# Patient Record
Sex: Female | Born: 1970
Health system: Southern US, Community
[De-identification: ages and names within clinical notes are randomized; demographics above are authoritative.]

## PROBLEM LIST (undated history)

## (undated) DIAGNOSIS — M359 Systemic involvement of connective tissue, unspecified: Secondary | ICD-10-CM

## (undated) DIAGNOSIS — F329 Major depressive disorder, single episode, unspecified: Secondary | ICD-10-CM

## (undated) DIAGNOSIS — J449 Chronic obstructive pulmonary disease, unspecified: Secondary | ICD-10-CM

## (undated) DIAGNOSIS — R1032 Left lower quadrant pain: Secondary | ICD-10-CM

## (undated) DIAGNOSIS — M1990 Unspecified osteoarthritis, unspecified site: Secondary | ICD-10-CM

## (undated) DIAGNOSIS — Z9889 Other specified postprocedural states: Secondary | ICD-10-CM

## (undated) DIAGNOSIS — M549 Dorsalgia, unspecified: Secondary | ICD-10-CM

## (undated) DIAGNOSIS — E039 Hypothyroidism, unspecified: Secondary | ICD-10-CM

## (undated) DIAGNOSIS — R569 Unspecified convulsions: Secondary | ICD-10-CM

## (undated) DIAGNOSIS — K5909 Other constipation: Secondary | ICD-10-CM

## (undated) DIAGNOSIS — N289 Disorder of kidney and ureter, unspecified: Secondary | ICD-10-CM

## (undated) DIAGNOSIS — G43909 Migraine, unspecified, not intractable, without status migrainosus: Secondary | ICD-10-CM

## (undated) DIAGNOSIS — I10 Essential (primary) hypertension: Secondary | ICD-10-CM

## (undated) DIAGNOSIS — F17200 Nicotine dependence, unspecified, uncomplicated: Secondary | ICD-10-CM

## (undated) DIAGNOSIS — R110 Nausea: Secondary | ICD-10-CM

## (undated) DIAGNOSIS — R011 Cardiac murmur, unspecified: Secondary | ICD-10-CM

## (undated) DIAGNOSIS — R52 Pain, unspecified: Secondary | ICD-10-CM

## (undated) DIAGNOSIS — F418 Other specified anxiety disorders: Secondary | ICD-10-CM

## (undated) DIAGNOSIS — D72829 Elevated white blood cell count, unspecified: Secondary | ICD-10-CM

## (undated) DIAGNOSIS — Z8669 Personal history of other diseases of the nervous system and sense organs: Secondary | ICD-10-CM

## (undated) DIAGNOSIS — F319 Bipolar disorder, unspecified: Secondary | ICD-10-CM

## (undated) DIAGNOSIS — M79606 Pain in leg, unspecified: Secondary | ICD-10-CM

## (undated) DIAGNOSIS — F32A Depression, unspecified: Secondary | ICD-10-CM

## (undated) DIAGNOSIS — E785 Hyperlipidemia, unspecified: Secondary | ICD-10-CM

## (undated) DIAGNOSIS — R112 Nausea with vomiting, unspecified: Secondary | ICD-10-CM

## (undated) DIAGNOSIS — G8929 Other chronic pain: Secondary | ICD-10-CM

## (undated) DIAGNOSIS — M5416 Radiculopathy, lumbar region: Secondary | ICD-10-CM

## (undated) DIAGNOSIS — R5383 Other fatigue: Secondary | ICD-10-CM

## (undated) DIAGNOSIS — E669 Obesity, unspecified: Secondary | ICD-10-CM

## (undated) DIAGNOSIS — Z862 Personal history of diseases of the blood and blood-forming organs and certain disorders involving the immune mechanism: Secondary | ICD-10-CM

## (undated) DIAGNOSIS — M25569 Pain in unspecified knee: Secondary | ICD-10-CM

## (undated) HISTORY — DX: Pain in unspecified knee: M25.569

## (undated) HISTORY — DX: Unspecified osteoarthritis, unspecified site: M19.90

## (undated) HISTORY — DX: Other chronic pain: G89.29

## (undated) HISTORY — DX: Migraine, unspecified, not intractable, without status migrainosus: G43.909

## (undated) HISTORY — DX: Obesity, unspecified: E66.9

## (undated) HISTORY — DX: Depression, unspecified: F32.A

## (undated) HISTORY — DX: Unspecified convulsions: R56.9

## (undated) HISTORY — DX: Left lower quadrant pain: R10.32

## (undated) HISTORY — DX: Nausea: R11.0

## (undated) HISTORY — DX: Personal history of diseases of the blood and blood-forming organs and certain disorders involving the immune mechanism: Z86.2

## (undated) HISTORY — DX: Other specified anxiety disorders: F41.8

## (undated) HISTORY — DX: Dorsalgia, unspecified: M54.9

## (undated) HISTORY — DX: Other fatigue: R53.83

## (undated) HISTORY — DX: Chronic obstructive pulmonary disease, unspecified: J44.9

## (undated) HISTORY — DX: Hyperlipidemia, unspecified: E78.5

## (undated) HISTORY — DX: Personal history of other diseases of the nervous system and sense organs: Z86.69

## (undated) HISTORY — DX: Other constipation: K59.09

## (undated) HISTORY — DX: Essential (primary) hypertension: I10

## (undated) HISTORY — DX: Bipolar disorder, unspecified: F31.9

## (undated) HISTORY — DX: Elevated white blood cell count, unspecified: D72.829

## (undated) HISTORY — DX: Major depressive disorder, single episode, unspecified: F32.9

## (undated) HISTORY — DX: Nicotine dependence, unspecified, uncomplicated: F17.200

## (undated) HISTORY — PX: BACK SURGERY: SHX140

---

## 2003-09-07 ENCOUNTER — Emergency Department (HOSPITAL_COMMUNITY): Admission: EM | Admit: 2003-09-07 | Discharge: 2003-09-08 | Payer: Self-pay | Admitting: Emergency Medicine

## 2003-09-15 ENCOUNTER — Ambulatory Visit (HOSPITAL_COMMUNITY): Admission: RE | Admit: 2003-09-15 | Discharge: 2003-09-15 | Payer: Self-pay | Admitting: Orthopaedic Surgery

## 2003-09-22 ENCOUNTER — Encounter (HOSPITAL_COMMUNITY): Admission: RE | Admit: 2003-09-22 | Discharge: 2003-10-22 | Payer: Self-pay | Admitting: Orthopaedic Surgery

## 2004-11-17 ENCOUNTER — Emergency Department (HOSPITAL_COMMUNITY): Admission: EM | Admit: 2004-11-17 | Discharge: 2004-11-17 | Payer: Self-pay | Admitting: Emergency Medicine

## 2005-01-14 ENCOUNTER — Emergency Department (HOSPITAL_COMMUNITY): Admission: EM | Admit: 2005-01-14 | Discharge: 2005-01-14 | Payer: Self-pay | Admitting: Emergency Medicine

## 2005-01-26 ENCOUNTER — Encounter: Payer: Self-pay | Admitting: Family Medicine

## 2005-04-23 DIAGNOSIS — F418 Other specified anxiety disorders: Secondary | ICD-10-CM

## 2005-04-23 HISTORY — DX: Other specified anxiety disorders: F41.8

## 2005-12-30 ENCOUNTER — Emergency Department (HOSPITAL_COMMUNITY): Admission: EM | Admit: 2005-12-30 | Discharge: 2005-12-30 | Payer: Self-pay | Admitting: Emergency Medicine

## 2006-01-21 ENCOUNTER — Ambulatory Visit: Payer: Self-pay | Admitting: Psychiatry

## 2006-01-21 ENCOUNTER — Inpatient Hospital Stay (HOSPITAL_COMMUNITY): Admission: RE | Admit: 2006-01-21 | Discharge: 2006-01-25 | Payer: Self-pay | Admitting: Psychiatry

## 2006-03-30 ENCOUNTER — Emergency Department (HOSPITAL_COMMUNITY): Admission: EM | Admit: 2006-03-30 | Discharge: 2006-03-30 | Payer: Self-pay | Admitting: Emergency Medicine

## 2006-04-01 ENCOUNTER — Emergency Department (HOSPITAL_COMMUNITY): Admission: EM | Admit: 2006-04-01 | Discharge: 2006-04-01 | Payer: Self-pay | Admitting: Emergency Medicine

## 2006-06-20 ENCOUNTER — Ambulatory Visit (HOSPITAL_COMMUNITY): Admission: RE | Admit: 2006-06-20 | Discharge: 2006-06-20 | Payer: Self-pay | Admitting: Family Medicine

## 2006-12-26 ENCOUNTER — Ambulatory Visit (HOSPITAL_COMMUNITY): Admission: RE | Admit: 2006-12-26 | Discharge: 2006-12-26 | Payer: Self-pay | Admitting: Family Medicine

## 2007-04-18 ENCOUNTER — Emergency Department (HOSPITAL_COMMUNITY): Admission: EM | Admit: 2007-04-18 | Discharge: 2007-04-18 | Payer: Self-pay | Admitting: Emergency Medicine

## 2007-04-24 ENCOUNTER — Encounter: Payer: Self-pay | Admitting: Family Medicine

## 2007-04-24 HISTORY — PX: ABDOMINAL HYSTERECTOMY: SHX81

## 2007-07-07 ENCOUNTER — Emergency Department (HOSPITAL_COMMUNITY): Admission: EM | Admit: 2007-07-07 | Discharge: 2007-07-07 | Payer: Self-pay | Admitting: Emergency Medicine

## 2008-04-28 ENCOUNTER — Emergency Department (HOSPITAL_COMMUNITY): Admission: EM | Admit: 2008-04-28 | Discharge: 2008-04-29 | Payer: Self-pay | Admitting: Emergency Medicine

## 2008-06-03 ENCOUNTER — Emergency Department (HOSPITAL_COMMUNITY): Admission: EM | Admit: 2008-06-03 | Discharge: 2008-06-03 | Payer: Self-pay | Admitting: Emergency Medicine

## 2008-06-24 ENCOUNTER — Emergency Department (HOSPITAL_COMMUNITY): Admission: EM | Admit: 2008-06-24 | Discharge: 2008-06-24 | Payer: Self-pay | Admitting: Emergency Medicine

## 2008-08-27 ENCOUNTER — Emergency Department (HOSPITAL_COMMUNITY): Admission: EM | Admit: 2008-08-27 | Discharge: 2008-08-27 | Payer: Self-pay | Admitting: Emergency Medicine

## 2008-09-11 ENCOUNTER — Emergency Department (HOSPITAL_COMMUNITY): Admission: EM | Admit: 2008-09-11 | Discharge: 2008-09-11 | Payer: Self-pay | Admitting: Emergency Medicine

## 2008-09-13 ENCOUNTER — Ambulatory Visit: Payer: Self-pay | Admitting: Family Medicine

## 2008-09-13 DIAGNOSIS — M549 Dorsalgia, unspecified: Secondary | ICD-10-CM | POA: Insufficient documentation

## 2008-09-13 DIAGNOSIS — M940 Chondrocostal junction syndrome [Tietze]: Secondary | ICD-10-CM | POA: Insufficient documentation

## 2008-09-13 DIAGNOSIS — I10 Essential (primary) hypertension: Secondary | ICD-10-CM | POA: Insufficient documentation

## 2008-09-13 DIAGNOSIS — L049 Acute lymphadenitis, unspecified: Secondary | ICD-10-CM | POA: Insufficient documentation

## 2008-09-16 ENCOUNTER — Encounter: Payer: Self-pay | Admitting: Family Medicine

## 2008-09-21 ENCOUNTER — Encounter: Payer: Self-pay | Admitting: Family Medicine

## 2008-09-21 ENCOUNTER — Telehealth: Payer: Self-pay | Admitting: Family Medicine

## 2008-09-22 ENCOUNTER — Telehealth: Payer: Self-pay | Admitting: Family Medicine

## 2008-09-27 ENCOUNTER — Telehealth: Payer: Self-pay | Admitting: Family Medicine

## 2008-09-27 LAB — CONVERTED CEMR LAB
BUN: 6 mg/dL (ref 6–23)
Basophils Absolute: 0 10*3/uL (ref 0.0–0.1)
Basophils Relative: 0 % (ref 0–1)
CO2: 25 meq/L (ref 19–32)
Calcium: 9.3 mg/dL (ref 8.4–10.5)
Chloride: 102 meq/L (ref 96–112)
Cholesterol: 220 mg/dL — ABNORMAL HIGH (ref 0–200)
Creatinine, Ser: 0.69 mg/dL (ref 0.40–1.20)
Eosinophils Absolute: 0.4 10*3/uL (ref 0.0–0.7)
Eosinophils Relative: 4 % (ref 0–5)
Glucose, Bld: 83 mg/dL (ref 70–99)
HCT: 41 % (ref 36.0–46.0)
HDL: 31 mg/dL — ABNORMAL LOW (ref >39)
Hemoglobin: 13.9 g/dL (ref 12.0–15.0)
LDL Cholesterol: 137 mg/dL — ABNORMAL HIGH (ref 0–99)
Lymphocytes Relative: 34 % (ref 12–46)
Lymphs Abs: 3.7 10*3/uL (ref 0.7–4.0)
MCHC: 33.9 g/dL (ref 30.0–36.0)
MCV: 94.5 fL (ref 78.0–100.0)
Monocytes Absolute: 0.7 10*3/uL (ref 0.1–1.0)
Monocytes Relative: 6 % (ref 3–12)
Neutro Abs: 6 10*3/uL (ref 1.7–7.7)
Neutrophils Relative %: 55 % (ref 43–77)
Platelets: 396 10*3/uL (ref 150–400)
Potassium: 4.3 meq/L (ref 3.5–5.3)
RBC: 4.34 M/uL (ref 3.87–5.11)
RDW: 12.6 % (ref 11.5–15.5)
Sodium: 139 meq/L (ref 135–145)
Total CHOL/HDL Ratio: 7.1
Triglycerides: 261 mg/dL — ABNORMAL HIGH (ref <150)
VLDL: 52 mg/dL — ABNORMAL HIGH (ref 0–40)
WBC: 10.8 10*3/uL — ABNORMAL HIGH (ref 4.0–10.5)

## 2008-10-14 ENCOUNTER — Telehealth: Payer: Self-pay | Admitting: Family Medicine

## 2008-10-14 ENCOUNTER — Emergency Department (HOSPITAL_COMMUNITY): Admission: EM | Admit: 2008-10-14 | Discharge: 2008-10-14 | Payer: Self-pay | Admitting: Emergency Medicine

## 2008-10-15 ENCOUNTER — Ambulatory Visit: Payer: Self-pay | Admitting: Family Medicine

## 2008-10-18 ENCOUNTER — Encounter: Payer: Self-pay | Admitting: Family Medicine

## 2008-10-19 ENCOUNTER — Telehealth: Payer: Self-pay | Admitting: Family Medicine

## 2008-10-19 DIAGNOSIS — E876 Hypokalemia: Secondary | ICD-10-CM | POA: Insufficient documentation

## 2008-10-20 ENCOUNTER — Encounter: Payer: Self-pay | Admitting: Family Medicine

## 2008-10-21 LAB — CONVERTED CEMR LAB: Potassium: 3.6 meq/L (ref 3.5–5.3)

## 2008-10-22 ENCOUNTER — Telehealth: Payer: Self-pay | Admitting: Family Medicine

## 2008-11-03 ENCOUNTER — Emergency Department (HOSPITAL_COMMUNITY): Admission: EM | Admit: 2008-11-03 | Discharge: 2008-11-03 | Payer: Self-pay | Admitting: Emergency Medicine

## 2008-11-04 ENCOUNTER — Ambulatory Visit: Payer: Self-pay | Admitting: Family Medicine

## 2008-11-04 DIAGNOSIS — E785 Hyperlipidemia, unspecified: Secondary | ICD-10-CM | POA: Insufficient documentation

## 2008-11-08 ENCOUNTER — Telehealth: Payer: Self-pay | Admitting: Family Medicine

## 2008-11-08 ENCOUNTER — Encounter: Payer: Self-pay | Admitting: Family Medicine

## 2008-12-16 ENCOUNTER — Ambulatory Visit: Payer: Self-pay | Admitting: Family Medicine

## 2008-12-16 DIAGNOSIS — R5383 Other fatigue: Secondary | ICD-10-CM | POA: Insufficient documentation

## 2008-12-17 LAB — CONVERTED CEMR LAB
ALT: 16 units/L (ref 0–35)
AST: 26 units/L (ref 0–37)
Albumin: 4.3 g/dL (ref 3.5–5.2)
Alkaline Phosphatase: 107 units/L (ref 39–117)
BUN: 9 mg/dL (ref 6–23)
Bilirubin, Direct: <0.1 mg/dL (ref 0.0–0.3)
CO2: 30 meq/L (ref 19–32)
Calcium: 10.3 mg/dL (ref 8.4–10.5)
Chloride: 92 meq/L — ABNORMAL LOW (ref 96–112)
Cholesterol: 191 mg/dL (ref 0–200)
Creatinine, Ser: 0.73 mg/dL (ref 0.40–1.20)
Glucose, Bld: 91 mg/dL (ref 70–99)
HDL: 39 mg/dL — ABNORMAL LOW (ref >39)
LDL Cholesterol: 110 mg/dL — ABNORMAL HIGH (ref 0–99)
Potassium: 3.3 meq/L — ABNORMAL LOW (ref 3.5–5.3)
Sodium: 136 meq/L (ref 135–145)
TSH: 2.417 microintl units/mL (ref 0.350–4.500)
Total Bilirubin: 0.2 mg/dL — ABNORMAL LOW (ref 0.3–1.2)
Total CHOL/HDL Ratio: 4.9
Total Protein: 6.9 g/dL (ref 6.0–8.3)
Triglycerides: 209 mg/dL — ABNORMAL HIGH (ref <150)
VLDL: 42 mg/dL — ABNORMAL HIGH (ref 0–40)

## 2008-12-19 DIAGNOSIS — F17200 Nicotine dependence, unspecified, uncomplicated: Secondary | ICD-10-CM | POA: Insufficient documentation

## 2008-12-22 ENCOUNTER — Telehealth: Payer: Self-pay | Admitting: Family Medicine

## 2008-12-24 ENCOUNTER — Encounter: Payer: Self-pay | Admitting: Family Medicine

## 2008-12-30 ENCOUNTER — Encounter: Payer: Self-pay | Admitting: Family Medicine

## 2008-12-31 ENCOUNTER — Encounter: Payer: Self-pay | Admitting: Family Medicine

## 2008-12-31 ENCOUNTER — Inpatient Hospital Stay (HOSPITAL_COMMUNITY): Admission: EM | Admit: 2008-12-31 | Discharge: 2009-01-02 | Payer: Self-pay | Admitting: Emergency Medicine

## 2009-01-03 ENCOUNTER — Telehealth: Payer: Self-pay | Admitting: Family Medicine

## 2009-01-05 ENCOUNTER — Telehealth: Payer: Self-pay | Admitting: Family Medicine

## 2009-01-05 LAB — CONVERTED CEMR LAB
BUN: 5 mg/dL — ABNORMAL LOW (ref 6–23)
CO2: 27 meq/L (ref 19–32)
Calcium: 10.4 mg/dL (ref 8.4–10.5)
Chloride: 96 meq/L (ref 96–112)
Creatinine, Ser: 0.94 mg/dL (ref 0.40–1.20)
Glucose, Bld: 115 mg/dL — ABNORMAL HIGH (ref 70–99)
Potassium: 3.5 meq/L (ref 3.5–5.3)
Sodium: 134 meq/L — ABNORMAL LOW (ref 135–145)

## 2009-01-11 ENCOUNTER — Telehealth: Payer: Self-pay | Admitting: Family Medicine

## 2009-01-12 ENCOUNTER — Ambulatory Visit: Payer: Self-pay | Admitting: Family Medicine

## 2009-01-12 DIAGNOSIS — R519 Headache, unspecified: Secondary | ICD-10-CM | POA: Insufficient documentation

## 2009-01-12 DIAGNOSIS — R110 Nausea: Secondary | ICD-10-CM | POA: Insufficient documentation

## 2009-01-12 DIAGNOSIS — R51 Headache: Secondary | ICD-10-CM | POA: Insufficient documentation

## 2009-01-13 ENCOUNTER — Telehealth: Payer: Self-pay | Admitting: Family Medicine

## 2009-01-19 ENCOUNTER — Telehealth: Payer: Self-pay | Admitting: Family Medicine

## 2009-01-20 ENCOUNTER — Telehealth: Payer: Self-pay | Admitting: Family Medicine

## 2009-01-20 ENCOUNTER — Encounter (INDEPENDENT_AMBULATORY_CARE_PROVIDER_SITE_OTHER): Payer: Self-pay | Admitting: *Deleted

## 2009-01-24 ENCOUNTER — Encounter: Payer: Self-pay | Admitting: Family Medicine

## 2009-01-27 ENCOUNTER — Telehealth: Payer: Self-pay | Admitting: Family Medicine

## 2009-01-27 ENCOUNTER — Ambulatory Visit: Payer: Self-pay | Admitting: Family Medicine

## 2009-01-27 LAB — CONVERTED CEMR LAB
Bilirubin Urine: NEGATIVE
Glucose, Urine, Semiquant: NEGATIVE
Ketones, urine, test strip: NEGATIVE
Nitrite: NEGATIVE
Protein, U semiquant: NEGATIVE
Specific Gravity, Urine: 1.015
Urobilinogen, UA: 0.2
WBC Urine, dipstick: NEGATIVE
pH: 7

## 2009-01-28 ENCOUNTER — Telehealth: Payer: Self-pay | Admitting: Family Medicine

## 2009-01-31 ENCOUNTER — Telehealth: Payer: Self-pay | Admitting: Family Medicine

## 2009-02-02 ENCOUNTER — Encounter: Payer: Self-pay | Admitting: Family Medicine

## 2009-02-03 ENCOUNTER — Other Ambulatory Visit: Admission: RE | Admit: 2009-02-03 | Discharge: 2009-02-03 | Payer: Self-pay | Admitting: Family Medicine

## 2009-02-03 ENCOUNTER — Ambulatory Visit: Payer: Self-pay | Admitting: Family Medicine

## 2009-02-03 ENCOUNTER — Encounter: Payer: Self-pay | Admitting: Family Medicine

## 2009-02-04 ENCOUNTER — Encounter: Payer: Self-pay | Admitting: Family Medicine

## 2009-02-04 LAB — CONVERTED CEMR LAB
BUN: 8 mg/dL (ref 6–23)
Bacteria, UA: NONE SEEN
Bilirubin Urine: NEGATIVE
CO2: 32 meq/L (ref 19–32)
Calcium: 10.4 mg/dL (ref 8.4–10.5)
Chloride: 96 meq/L (ref 96–112)
Creatinine, Ser: 0.86 mg/dL (ref 0.40–1.20)
Glucose, Bld: 99 mg/dL (ref 70–99)
Hemoglobin, Urine: NEGATIVE
Ketones, ur: NEGATIVE mg/dL
Nitrite: POSITIVE — AB
Potassium: 3.4 meq/L — ABNORMAL LOW (ref 3.5–5.3)
Protein, ur: NEGATIVE mg/dL
RBC / HPF: NONE SEEN (ref <3)
Sodium: 138 meq/L (ref 135–145)
Specific Gravity, Urine: 1.014 (ref 1.005–1.030)
Urine Glucose: NEGATIVE mg/dL
Urobilinogen, UA: 1 (ref 0.0–1.0)
WBC, UA: NONE SEEN cells/hpf (ref <3)
pH: 7 (ref 5.0–8.0)

## 2009-02-09 ENCOUNTER — Telehealth: Payer: Self-pay | Admitting: Family Medicine

## 2009-02-11 ENCOUNTER — Ambulatory Visit: Payer: Self-pay | Admitting: Gastroenterology

## 2009-02-11 DIAGNOSIS — J4489 Other specified chronic obstructive pulmonary disease: Secondary | ICD-10-CM | POA: Insufficient documentation

## 2009-02-11 DIAGNOSIS — K5909 Other constipation: Secondary | ICD-10-CM | POA: Insufficient documentation

## 2009-02-11 DIAGNOSIS — D72829 Elevated white blood cell count, unspecified: Secondary | ICD-10-CM | POA: Insufficient documentation

## 2009-02-11 DIAGNOSIS — K219 Gastro-esophageal reflux disease without esophagitis: Secondary | ICD-10-CM | POA: Insufficient documentation

## 2009-02-11 DIAGNOSIS — M1990 Unspecified osteoarthritis, unspecified site: Secondary | ICD-10-CM | POA: Insufficient documentation

## 2009-02-11 DIAGNOSIS — R569 Unspecified convulsions: Secondary | ICD-10-CM | POA: Insufficient documentation

## 2009-02-11 DIAGNOSIS — J449 Chronic obstructive pulmonary disease, unspecified: Secondary | ICD-10-CM | POA: Insufficient documentation

## 2009-02-11 DIAGNOSIS — R1032 Left lower quadrant pain: Secondary | ICD-10-CM | POA: Insufficient documentation

## 2009-02-11 DIAGNOSIS — F319 Bipolar disorder, unspecified: Secondary | ICD-10-CM | POA: Insufficient documentation

## 2009-02-21 ENCOUNTER — Encounter (INDEPENDENT_AMBULATORY_CARE_PROVIDER_SITE_OTHER): Payer: Self-pay | Admitting: *Deleted

## 2009-02-24 ENCOUNTER — Ambulatory Visit (HOSPITAL_COMMUNITY): Admission: RE | Admit: 2009-02-24 | Discharge: 2009-02-24 | Payer: Self-pay | Admitting: Urology

## 2009-02-28 ENCOUNTER — Encounter: Payer: Self-pay | Admitting: Family Medicine

## 2009-02-28 ENCOUNTER — Emergency Department (HOSPITAL_COMMUNITY): Admission: EM | Admit: 2009-02-28 | Discharge: 2009-03-01 | Payer: Self-pay | Admitting: Emergency Medicine

## 2009-03-01 ENCOUNTER — Encounter: Payer: Self-pay | Admitting: Family Medicine

## 2009-03-03 ENCOUNTER — Ambulatory Visit: Payer: Self-pay | Admitting: Family Medicine

## 2009-03-03 DIAGNOSIS — R112 Nausea with vomiting, unspecified: Secondary | ICD-10-CM | POA: Insufficient documentation

## 2009-03-04 LAB — CONVERTED CEMR LAB
BUN: 5 mg/dL — ABNORMAL LOW (ref 6–23)
Basophils Absolute: 0.1 10*3/uL (ref 0.0–0.1)
Basophils Relative: 1 % (ref 0–1)
CO2: 29 meq/L (ref 19–32)
Calcium: 10.4 mg/dL (ref 8.4–10.5)
Chloride: 93 meq/L — ABNORMAL LOW (ref 96–112)
Creatinine, Ser: 0.74 mg/dL (ref 0.40–1.20)
Eosinophils Absolute: 0.1 10*3/uL (ref 0.0–0.7)
Eosinophils Relative: 1 % (ref 0–5)
Glucose, Bld: 93 mg/dL (ref 70–99)
HCT: 42.5 % (ref 36.0–46.0)
Hemoglobin: 14.6 g/dL (ref 12.0–15.0)
Lymphocytes Relative: 41 % (ref 12–46)
Lymphs Abs: 5.5 10*3/uL — ABNORMAL HIGH (ref 0.7–4.0)
MCHC: 34.3 g/dL (ref 30.0–36.0)
MCV: 95.1 fL (ref 78.0–100.0)
Monocytes Absolute: 0.7 10*3/uL (ref 0.1–1.0)
Monocytes Relative: 5 % (ref 3–12)
Neutro Abs: 7.2 10*3/uL (ref 1.7–7.7)
Neutrophils Relative %: 53 % (ref 43–77)
Platelets: 391 10*3/uL (ref 150–400)
Potassium: 3.8 meq/L (ref 3.5–5.3)
RBC: 4.47 M/uL (ref 3.87–5.11)
RDW: 13 % (ref 11.5–15.5)
Sodium: 133 meq/L — ABNORMAL LOW (ref 135–145)
WBC: 13.6 10*3/uL — ABNORMAL HIGH (ref 4.0–10.5)

## 2009-03-07 ENCOUNTER — Telehealth: Payer: Self-pay | Admitting: Family Medicine

## 2009-03-10 ENCOUNTER — Encounter (HOSPITAL_COMMUNITY): Admission: RE | Admit: 2009-03-10 | Discharge: 2009-04-09 | Payer: Self-pay | Admitting: Family Medicine

## 2009-03-14 ENCOUNTER — Telehealth: Payer: Self-pay | Admitting: Family Medicine

## 2009-03-16 ENCOUNTER — Emergency Department (HOSPITAL_COMMUNITY): Admission: EM | Admit: 2009-03-16 | Discharge: 2009-03-16 | Payer: Self-pay | Admitting: Emergency Medicine

## 2009-03-16 ENCOUNTER — Ambulatory Visit: Payer: Self-pay | Admitting: Family Medicine

## 2009-03-16 ENCOUNTER — Telehealth: Payer: Self-pay | Admitting: Family Medicine

## 2009-03-16 DIAGNOSIS — R109 Unspecified abdominal pain: Secondary | ICD-10-CM | POA: Insufficient documentation

## 2009-03-21 ENCOUNTER — Ambulatory Visit: Payer: Self-pay | Admitting: Family Medicine

## 2009-03-22 ENCOUNTER — Telehealth: Payer: Self-pay | Admitting: Family Medicine

## 2009-03-23 ENCOUNTER — Telehealth: Payer: Self-pay | Admitting: Family Medicine

## 2009-03-28 ENCOUNTER — Telehealth: Payer: Self-pay | Admitting: Family Medicine

## 2009-03-29 LAB — CONVERTED CEMR LAB
BUN: 8 mg/dL (ref 6–23)
Basophils Absolute: 0.1 10*3/uL (ref 0.0–0.1)
Basophils Relative: 1 % (ref 0–1)
CO2: 29 meq/L (ref 19–32)
Calcium: 9.7 mg/dL (ref 8.4–10.5)
Chloride: 97 meq/L (ref 96–112)
Creatinine, Ser: 0.72 mg/dL (ref 0.40–1.20)
Eosinophils Absolute: 0.5 10*3/uL (ref 0.0–0.7)
Eosinophils Relative: 4 % (ref 0–5)
Glucose, Bld: 89 mg/dL (ref 70–99)
HCT: 42.4 % (ref 36.0–46.0)
Hemoglobin: 14.2 g/dL (ref 12.0–15.0)
Lymphocytes Relative: 43 % (ref 12–46)
Lymphs Abs: 4.6 10*3/uL — ABNORMAL HIGH (ref 0.7–4.0)
MCHC: 33.5 g/dL (ref 30.0–36.0)
MCV: 96.2 fL (ref 78.0–100.0)
Monocytes Absolute: 0.7 10*3/uL (ref 0.1–1.0)
Monocytes Relative: 6 % (ref 3–12)
Neutro Abs: 4.9 10*3/uL (ref 1.7–7.7)
Neutrophils Relative %: 45 % (ref 43–77)
Platelets: 403 10*3/uL — ABNORMAL HIGH (ref 150–400)
Potassium: 3.9 meq/L (ref 3.5–5.3)
RBC: 4.41 M/uL (ref 3.87–5.11)
RDW: 13.1 % (ref 11.5–15.5)
Sodium: 134 meq/L — ABNORMAL LOW (ref 135–145)
WBC: 10.7 10*3/uL — ABNORMAL HIGH (ref 4.0–10.5)

## 2009-04-03 ENCOUNTER — Emergency Department (HOSPITAL_COMMUNITY): Admission: EM | Admit: 2009-04-03 | Discharge: 2009-04-03 | Payer: Self-pay | Admitting: Emergency Medicine

## 2009-04-04 ENCOUNTER — Telehealth: Payer: Self-pay | Admitting: Family Medicine

## 2009-04-08 ENCOUNTER — Emergency Department (HOSPITAL_COMMUNITY): Admission: EM | Admit: 2009-04-08 | Discharge: 2009-04-08 | Payer: Self-pay | Admitting: Emergency Medicine

## 2009-04-11 ENCOUNTER — Encounter: Payer: Self-pay | Admitting: Family Medicine

## 2009-04-12 LAB — CONVERTED CEMR LAB: Potassium: 3.4 meq/L — ABNORMAL LOW (ref 3.5–5.3)

## 2009-04-20 ENCOUNTER — Ambulatory Visit: Payer: Self-pay | Admitting: Family Medicine

## 2009-04-20 DIAGNOSIS — M25559 Pain in unspecified hip: Secondary | ICD-10-CM | POA: Insufficient documentation

## 2009-04-25 ENCOUNTER — Telehealth: Payer: Self-pay | Admitting: Family Medicine

## 2009-04-26 ENCOUNTER — Telehealth: Payer: Self-pay | Admitting: Family Medicine

## 2009-05-04 ENCOUNTER — Telehealth: Payer: Self-pay | Admitting: Family Medicine

## 2009-05-17 ENCOUNTER — Telehealth: Payer: Self-pay | Admitting: Family Medicine

## 2009-05-24 ENCOUNTER — Ambulatory Visit: Payer: Self-pay | Admitting: Family Medicine

## 2009-05-24 DIAGNOSIS — D239 Other benign neoplasm of skin, unspecified: Secondary | ICD-10-CM | POA: Insufficient documentation

## 2009-05-25 LAB — CONVERTED CEMR LAB: Potassium: 3.7 meq/L (ref 3.5–5.3)

## 2009-07-11 ENCOUNTER — Emergency Department (HOSPITAL_COMMUNITY): Admission: EM | Admit: 2009-07-11 | Discharge: 2009-07-11 | Payer: Self-pay | Admitting: Emergency Medicine

## 2009-07-15 ENCOUNTER — Ambulatory Visit: Payer: Self-pay | Admitting: Family Medicine

## 2009-07-15 LAB — CONVERTED CEMR LAB
ALT: 9 units/L (ref 0–35)
AST: 17 units/L (ref 0–37)
Albumin: 3.7 g/dL (ref 3.5–5.2)
Alkaline Phosphatase: 97 units/L (ref 39–117)
BUN: 5 mg/dL — ABNORMAL LOW (ref 6–23)
CO2: 30 meq/L (ref 19–32)
Calcium: 9 mg/dL (ref 8.4–10.5)
Chloride: 98 meq/L (ref 96–112)
Creatinine, Ser: 0.68 mg/dL (ref 0.40–1.20)
Glucose, Bld: 81 mg/dL (ref 70–99)
Hgb A1c MFr Bld: 6.1 % (ref 4.6–6.1)
Potassium: 3.8 meq/L (ref 3.5–5.3)
Sodium: 138 meq/L (ref 135–145)
Total Bilirubin: 0.2 mg/dL — ABNORMAL LOW (ref 0.3–1.2)
Total Protein: 6 g/dL (ref 6.0–8.3)

## 2009-07-18 ENCOUNTER — Encounter (INDEPENDENT_AMBULATORY_CARE_PROVIDER_SITE_OTHER): Payer: Self-pay | Admitting: *Deleted

## 2009-08-01 ENCOUNTER — Encounter (INDEPENDENT_AMBULATORY_CARE_PROVIDER_SITE_OTHER): Payer: Self-pay | Admitting: *Deleted

## 2009-08-16 ENCOUNTER — Emergency Department (HOSPITAL_COMMUNITY)
Admission: EM | Admit: 2009-08-16 | Discharge: 2009-08-17 | Disposition: A | Discharge status: Other Acute Inpatient Hospital | Payer: Self-pay | Source: Home / Self Care | Admitting: Emergency Medicine

## 2009-08-29 ENCOUNTER — Ambulatory Visit: Payer: Self-pay | Admitting: Family Medicine

## 2009-09-01 ENCOUNTER — Telehealth: Payer: Self-pay | Admitting: Physician Assistant

## 2009-09-12 ENCOUNTER — Ambulatory Visit: Payer: Self-pay | Admitting: Family Medicine

## 2010-01-17 ENCOUNTER — Telehealth: Payer: Self-pay | Admitting: Family Medicine

## 2010-01-19 ENCOUNTER — Ambulatory Visit: Payer: Self-pay | Admitting: Family Medicine

## 2010-01-19 DIAGNOSIS — R599 Enlarged lymph nodes, unspecified: Secondary | ICD-10-CM | POA: Insufficient documentation

## 2010-01-20 LAB — CONVERTED CEMR LAB
BUN: 8 mg/dL (ref 6–23)
CO2: 31 meq/L (ref 19–32)
Calcium: 10.8 mg/dL — ABNORMAL HIGH (ref 8.4–10.5)
Chloride: 88 meq/L — ABNORMAL LOW (ref 96–112)
Creatinine, Ser: 0.79 mg/dL (ref 0.40–1.20)
Glucose, Bld: 86 mg/dL (ref 70–99)
Potassium: 3.5 meq/L (ref 3.5–5.3)
Sodium: 134 meq/L — ABNORMAL LOW (ref 135–145)

## 2010-01-24 ENCOUNTER — Telehealth: Payer: Self-pay | Admitting: Physician Assistant

## 2010-02-22 ENCOUNTER — Telehealth: Payer: Self-pay | Admitting: Family Medicine

## 2010-03-15 ENCOUNTER — Ambulatory Visit: Payer: Self-pay | Admitting: Family Medicine

## 2010-03-21 DIAGNOSIS — L919 Hypertrophic disorder of the skin, unspecified: Secondary | ICD-10-CM

## 2010-03-21 DIAGNOSIS — L909 Atrophic disorder of skin, unspecified: Secondary | ICD-10-CM | POA: Insufficient documentation

## 2010-03-24 ENCOUNTER — Telehealth (INDEPENDENT_AMBULATORY_CARE_PROVIDER_SITE_OTHER): Payer: Self-pay | Admitting: *Deleted

## 2010-03-29 ENCOUNTER — Telehealth: Payer: Self-pay | Admitting: Family Medicine

## 2010-03-29 ENCOUNTER — Telehealth (INDEPENDENT_AMBULATORY_CARE_PROVIDER_SITE_OTHER): Payer: Self-pay | Admitting: *Deleted

## 2010-03-30 ENCOUNTER — Encounter: Payer: Self-pay | Admitting: Family Medicine

## 2010-03-31 ENCOUNTER — Encounter: Payer: Self-pay | Admitting: Family Medicine

## 2010-03-31 LAB — CONVERTED CEMR LAB
ALT: 9 units/L (ref 0–35)
AST: 13 units/L (ref 0–37)
Albumin: 4.5 g/dL (ref 3.5–5.2)
Alkaline Phosphatase: 121 units/L — ABNORMAL HIGH (ref 39–117)
BUN: 5 mg/dL — ABNORMAL LOW (ref 6–23)
Basophils Absolute: 0 10*3/uL (ref 0.0–0.1)
Basophils Relative: 0 % (ref 0–1)
Bilirubin, Direct: 0.1 mg/dL (ref 0.0–0.3)
CO2: 27 meq/L (ref 19–32)
Calcium: 9.2 mg/dL (ref 8.4–10.5)
Chloride: 83 meq/L — ABNORMAL LOW (ref 96–112)
Cholesterol: 186 mg/dL (ref 0–200)
Creatinine, Ser: 0.58 mg/dL (ref 0.40–1.20)
Eosinophils Absolute: 0.2 10*3/uL (ref 0.0–0.7)
Eosinophils Relative: 2 % (ref 0–5)
Glucose, Bld: 90 mg/dL (ref 70–99)
HCT: 38.1 % (ref 36.0–46.0)
HDL: 42 mg/dL (ref >39)
Hemoglobin: 13.6 g/dL (ref 12.0–15.0)
Hgb A1c MFr Bld: 6 % — ABNORMAL HIGH (ref <5.7)
Indirect Bilirubin: 0.1 mg/dL (ref 0.0–0.9)
LDL Cholesterol: 116 mg/dL — ABNORMAL HIGH (ref 0–99)
Lymphocytes Relative: 38 % (ref 12–46)
Lymphs Abs: 3.9 10*3/uL (ref 0.7–4.0)
MCHC: 35.7 g/dL (ref 30.0–36.0)
MCV: 95 fL (ref 78.0–100.0)
Monocytes Absolute: 0.5 10*3/uL (ref 0.1–1.0)
Monocytes Relative: 5 % (ref 3–12)
Neutro Abs: 5.6 10*3/uL (ref 1.7–7.7)
Neutrophils Relative %: 54 % (ref 43–77)
Platelets: 408 10*3/uL — ABNORMAL HIGH (ref 150–400)
Potassium: 3.4 meq/L — ABNORMAL LOW (ref 3.5–5.3)
RBC: 4.01 M/uL (ref 3.87–5.11)
RDW: 13.2 % (ref 11.5–15.5)
Sodium: 126 meq/L — ABNORMAL LOW (ref 135–145)
TSH: 7.054 microintl units/mL — ABNORMAL HIGH (ref 0.350–4.500)
Total Bilirubin: 0.2 mg/dL — ABNORMAL LOW (ref 0.3–1.2)
Total CHOL/HDL Ratio: 4.4
Total Protein: 6.7 g/dL (ref 6.0–8.3)
Triglycerides: 141 mg/dL (ref <150)
VLDL: 28 mg/dL (ref 0–40)
Vit D, 25-Hydroxy: 32 ng/mL (ref 30–89)
WBC: 10.3 10*3/uL (ref 4.0–10.5)

## 2010-04-03 ENCOUNTER — Encounter: Payer: Self-pay | Admitting: Family Medicine

## 2010-04-03 DIAGNOSIS — R946 Abnormal results of thyroid function studies: Secondary | ICD-10-CM | POA: Insufficient documentation

## 2010-04-03 LAB — CONVERTED CEMR LAB
Free T4: 0.85 ng/dL (ref 0.80–1.80)
T3, Free: 3.3 pg/mL (ref 2.3–4.2)

## 2010-04-04 ENCOUNTER — Encounter: Payer: Self-pay | Admitting: Family Medicine

## 2010-04-05 ENCOUNTER — Ambulatory Visit (HOSPITAL_COMMUNITY)
Admission: RE | Admit: 2010-04-05 | Discharge: 2010-04-05 | Payer: Self-pay | Source: Home / Self Care | Attending: Family Medicine | Admitting: Family Medicine

## 2010-04-11 ENCOUNTER — Telehealth: Payer: Self-pay | Admitting: Family Medicine

## 2010-04-17 ENCOUNTER — Encounter: Payer: Self-pay | Admitting: Family Medicine

## 2010-04-25 ENCOUNTER — Telehealth: Payer: Self-pay | Admitting: Family Medicine

## 2010-05-05 ENCOUNTER — Telehealth: Payer: Self-pay | Admitting: Family Medicine

## 2010-05-14 ENCOUNTER — Encounter: Payer: Self-pay | Admitting: Gastroenterology

## 2010-05-19 ENCOUNTER — Telehealth: Payer: Self-pay | Admitting: Family Medicine

## 2010-05-23 NOTE — Progress Notes (Signed)
  Phone Note Call from Patient   Summary of Call: Patient called and said that lastweek and this week her BP was 140/96 to 160/96. She has been getting headaches, also dizziness and lightheaded and arms have been going numb and that really is worrying her. She thinks her BP meds needs to be increased. Also wants some more ibuprofen for the headaches  [phone] [facility] [place] Initial call taken by: Everitt Amber,  October 14, 2008 8:05 AM  Follow-up for Phone Call        Patient called back c/o same symptoms and wants to know what to do Follow-up by: Everitt Amber,  October 14, 2008 2:13 PM  Additional Follow-up for Phone Call Additional follow up Details #1::        advise go to ed, she agrees Additional Follow-up by: Syliva Overman MD,  October 14, 2008 6:20 PM

## 2010-05-23 NOTE — Miscellaneous (Signed)
Summary: refill  Clinical Lists Changes  Medications: Rx of MAXZIDE-25 37.5-25 MG TABS (TRIAMTERENE-HCTZ) Take 1 tablet by mouth once a day;  #30 x 3;  Signed;  Entered by: Everitt Amber;  Authorized by: Syliva Overman MD;  Method used: Electronically to [facility] [address] [phone]*, [address], [place], [place]  [address], Ph: [phone], Fax: [phone]    Prescriptions: MAXZIDE-25 37.5-25 MG TABS (TRIAMTERENE-HCTZ) Take 1 tablet by mouth once a day  #30 x 3   Entered by:   Everitt Amber   Authorized by:   Syliva Overman MD   Signed by:   Everitt Amber on 02/02/2009   Method used:   Electronically to        [facility] [address] [phone]* (retail)       [address], [place]  [address]       Ph: [phone]       Fax: [phone]   RxID:   [id number]

## 2010-05-23 NOTE — Assessment & Plan Note (Signed)
Summary: hospital follow up- room 1   Vital Signs:  Patient profile:   40 year old female Menstrual status:  hysterectomy Height:      63 inches Weight:      171.75 pounds BMI:     30.53 O2 Sat:      95 % on Room air Pulse rate:   82 / minute Resp:     16 per minute  Vitals Entered By: Adella Hare LPN (July 15, 2009 10:46 AM) CC: hospital follow up Is Patient Diabetic? No Pain Assessment Patient in pain? no        Referring Provider:  Dr. Judie Petit. [facility] Primary Provider:  Dr. Berenice Primas  CC:  hospital follow up.  History of Present Illness: Pt was seen in ER  last week.  Went for nausea & vomiting.  States the stomach flu was going thru her family.  But in review pt has been having recurrent episodes of nausea & vomiting since last fall. No previous GI consult per pt.  States she is taking the omeprazole daily. Pt states she has low K+ while there.  She is on K+ suppl daily. Still feeling nauseated.  Taking Phenergan as needed & works well. Bm's constipated -hx of.  Using Miralax.  Also states she has nosebleeds at night last couple of nights.  When awakens has some blood on her pillow.  Had cystoscopy last wk.  Hx hematuria.       Current Medications (verified): 1)  Lamictal 150 Mg Tabs (Lamotrigine) .... Take 1 Tablet By Mouth Two Times A Day 2)  Alprazolam 1 Mg Tabs (Alprazolam) .... Take One Tab in The Am and Two Tabs The Pm 3)  Maxzide-25 37.5-25 Mg Tabs (Triamterene-Hctz) .... Take One and A Half Tablet Daily 4)  Simvastatin 20 Mg Tabs (Simvastatin) .... One Tab By Mouth Qhs 5)  Thiothixene 2 Mg Caps (Thiothixene) .... One Cap By Mouth Qhs 6)  Omeprazole 20 Mg Tbec (Omeprazole) .... One By Mouth Daily 30 Min Before Breakfast 7)  Miralax  Powd (Polyethylene Glycol 3350) .Marland KitchenMarland KitchenMarland Kitchen 17 Grams By Mouth Daily As Needed For Constipation 8)  Potassium Chloride 20 Meq/73ml (10%) Liqd (Potassium Chloride) .... 30 Cc Two Times A Day 9)  Dicyclomine Hcl 10 Mg Caps (Dicyclomine  Hcl) .... Take 1 Capsule By Mouth Two Times A Day  As Needed 10)  Fluoxetine Hcl 10 Mg Caps (Fluoxetine Hcl) .... One Cap By Mouth Qd 11)  Vesicare 5 Mg Tabs (Solifenacin Succinate) .Marland Kitchen.. 1 Tab Once Daily 12)  Spiriva Handihaler 18 Mcg Caps (Tiotropium Bromide Monohydrate) .... One Inhalation Daily  Allergies (verified): 1)  ! Iodine 2)  ! Depakote 3)  ! Codeine 4)  ! Lithium Carbonate (Lithium Carbonate) 5)  Pcn  Past History:  Past medical history reviewed for relevance to current acute and chronic problems.  Past Medical History: Reviewed history from 02/11/2009 and no changes required. Hypertension  Arthritis Depression/Anxiety, HOSPITALISED FOR MENTAL HEALTH PROBS  2007, Dr Thomasena Edis follows her Seizures Migraines Chronic back pain Hx of LEUKOCYTOSIS (ICD-288.60) Hx of SEIZURE DISORDER (ICD-780.39) BIPOLAR AFFECTIVE DISORDER (ICD-296.80) OSTEOARTHRITIS, MILD (ICD-715.90) ABDOMINAL PAIN, LEFT LOWER QUADRANT (ICD-789.04) CONSTIPATION, CHRONIC (ICD-564.09) NAUSEA (ICD-787.02) NICOTINE ADDICTION (ICD-305.1) OBESITY, UNSPECIFIED (ICD-278.00) FATIGUE (ICD-780.79) KNEE PAIN, RIGHT, ACUTE (ICD-719.46) HYPERLIPIDEMIA (ICD-272.4) HYPOKALEMIA (ICD-276.8) COPD  Review of Systems General:  Denies chills and fever. ENT:  Complains of nosebleeds; denies nasal congestion and sinus pressure. CV:  Denies chest pain or discomfort. GI:  Complains of abdominal pain, nausea,  and vomiting.  Physical Exam  General:  Well-developed,well-nourished,in no acute distress; alert,appropriate and cooperative throughout examination Head:  Normocephalic and atraumatic without obvious abnormalities. No apparent alopecia or balding. Ears:  External ear exam shows no significant lesions or deformities.  Otoscopic examination reveals clear canals, tympanic membranes are intact bilaterally without bulging, retraction, inflammation or discharge. Hearing is grossly normal bilaterally. Nose:  no external  deformity and no nasal discharge.  Lt septum in erythematous & friable.   Mouth:  Oral mucosa and oropharynx without lesions or exudates.   Neck:  No deformities, masses, or tenderness noted. Lungs:  Normal respiratory effort, chest expands symmetrically. Lungs are clear to auscultation, no crackles or wheezes. Heart:  Normal rate and regular rhythm. S1 and S2 normal without gallop, murmur, click, rub or other extra sounds. Abdomen:  soft, no hepatomegaly, and no splenomegaly.  Pt reports diffuse TTP. Cervical Nodes:  No lymphadenopathy noted   Impression & Recommendations:  Problem # 1:  NAUSEA WITH VOMITING (ICD-787.01) Assessment Unchanged  Recurrent  Orders: T-Comprehensive Metabolic Panel ([id number]) Gastroenterology Referral (GI)  Problem # 2:  HYPOKALEMIA (ICD-276.8) Assessment: Deteriorated  Problem # 3:  ABDOMINAL PAIN (ICD-789.00) Assessment: Unchanged  Orders: T-Comprehensive Metabolic Panel ([id number])  Problem # 4:  GERD (ICD-530.81) Assessment: Improved  Her updated medication list for this problem includes:    Omeprazole 20 Mg Tbec (Omeprazole) ..... One by mouth daily 30 min before breakfast    Dicyclomine Hcl 10 Mg Caps (Dicyclomine hcl) .Marland Kitchen... Take 1 capsule by mouth two times a day  as needed  Orders: Gastroenterology Referral (GI)  Complete Medication List: 1)  Lamictal 150 Mg Tabs (Lamotrigine) .... Take 1 tablet by mouth two times a day 2)  Alprazolam 1 Mg Tabs (Alprazolam) .... Take one tab in the am and two tabs the pm 3)  Maxzide-25 37.5-25 Mg Tabs (Triamterene-hctz) .... Take one and a half tablet daily 4)  Simvastatin 20 Mg Tabs (Simvastatin) .... One tab by mouth qhs 5)  Thiothixene 2 Mg Caps (Thiothixene) .... One cap by mouth qhs 6)  Omeprazole 20 Mg Tbec (Omeprazole) .... One by mouth daily 30 min before breakfast 7)  Miralax Powd (Polyethylene glycol 3350) .Marland KitchenMarland KitchenMarland Kitchen 17 grams by mouth daily as needed for constipation 8)  Potassium Chloride  20 Meq/72ml (10%) Liqd (Potassium chloride) .... 30 cc two times a day 9)  Dicyclomine Hcl 10 Mg Caps (Dicyclomine hcl) .... Take 1 capsule by mouth two times a day  as needed 10)  Fluoxetine Hcl 10 Mg Caps (Fluoxetine hcl) .... One cap by mouth qd 11)  Vesicare 5 Mg Tabs (Solifenacin succinate) .Marland Kitchen.. 1 tab once daily 12)  Spiriva Handihaler 18 Mcg Caps (Tiotropium bromide monohydrate) .... One inhalation daily 13)  Promethazine Hcl 25 Mg Tabs (Promethazine hcl) .... Take 1 every 8 hrs as needed  Other Orders: T- Hemoglobin A1C ([id number])  Patient Instructions: 1)  1)  Keep your appt in May with Dr Lodema Hong 2)  2)  Continue Phenergan as needed for nausea & vomiting. 3)  3)  I am referring you to a GI doctor due to your recurrent episodes on nausea & vomiting. 4)  4)  I have ordered blood work for you to have drawn today. 5)  Tobacco is very bad for your health and your loved ones! You Should stop smoking!. Prescriptions: PROMETHAZINE HCL 25 MG TABS (PROMETHAZINE HCL) take 1 every 8 hrs as needed  #30 x 0   Entered and Authorized by:  Esperanza Sheets PA   Signed by:   Esperanza Sheets PA on 07/15/2009   Method used:   Electronically to        [facility] [phone]* (retail)       [address] [place]  [address]       Ph: [phone]       Fax: [phone]   RxID:   [id number]

## 2010-05-23 NOTE — Progress Notes (Signed)
Summary: call  Phone Note Call from Patient   Summary of Call: wants someone to call her got out of the hospital yesterday and said dr. Lodema Hong wants her to do lab work  arms are bruised call back at [phone] Initial call taken by: Lind Guest,  January 03, 2009 1:17 PM  Follow-up for Phone Call        find out which hospital she wa at and request the labs they had there, if she wants to put off labs for another 10 days this is ok Follow-up by: Syliva Overman MD,  January 03, 2009 3:23 PM  Additional Follow-up for Phone Call Additional follow up Details #1::        Phone Call Completed Additional Follow-up by: Worthy Keeler LPN,  January 03, 2009 3:34 PM

## 2010-05-23 NOTE — Miscellaneous (Signed)
  Clinical Lists Changes  Medications: Added new medication of POTASSIUM CHLORIDE 20 MEQ/15ML (10%) LIQD (POTASSIUM CHLORIDE) 30 cc three times daily - Signed Rx of POTASSIUM CHLORIDE 20 MEQ/15ML (10%) LIQD (POTASSIUM CHLORIDE) 30 cc three times daily;  #2700cc x 2;  Signed;  Entered by: Syliva Overman MD;  Authorized by: Syliva Overman MD;  Method used: Historical    Prescriptions: POTASSIUM CHLORIDE 20 MEQ/15ML (10%) LIQD (POTASSIUM CHLORIDE) 30 cc three times daily  #2700cc x 2   Entered and Authorized by:   Syliva Overman MD   Signed by:   Syliva Overman MD on 03/31/2010   Method used:   Historical   RxID:   [id number]

## 2010-05-23 NOTE — Progress Notes (Signed)
Summary: speak about medicine  Phone Note Call from Patient   Summary of Call: pt is on vesilcare and wants to know if this is ok to take with all other medicine. says her stomach has been hurt. [phone] Initial call taken by: Rudene Anda,  May 04, 2009 10:32 AM  Follow-up for Phone Call        advise yes, thisis ok, she has had stomach pain for a while and I think she sees the [facility] docs about that Follow-up by: Syliva Overman MD,  May 04, 2009 12:16 PM  Additional Follow-up for Phone Call Additional follow up Details #1::        called patient, mailbox full Additional Follow-up by: Worthy Keeler LPN,  May 04, 2009 12:56 PM    Additional Follow-up for Phone Call Additional follow up Details #2::    Called patient again, mailbox full, cannot leave message Follow-up by: Everitt Amber,  May 05, 2009 9:10 AM  Additional Follow-up for Phone Call Additional follow up Details #3:: Details for Additional Follow-up Action Taken: Patient aware Additional Follow-up by: Everitt Amber,  May 05, 2009 10:59 AM

## 2010-05-23 NOTE — Assessment & Plan Note (Signed)
Summary: abd pain/ams   Visit Type:  Initial Consult Referring Provider:  Dr. Judie Petit. [facility] Primary Care Provider:  Dr. Berenice Primas  Chief Complaint:  abd pain.  History of Present Illness: 40 y/o caucasian female recently admitted to [facility] with hypokalemia.  She has chronic daily nausea & vomiting for couple months.  "Can't swallow potassium pills, so had not been taking."  The pain is localized to the left lower quadrant lasts about a week, "feels like where ovary is."  c/o 10/10 pain @ worst.  9/10 now.  The patient complains of abdominal bloating, abdominal pain, pelvic pain, nausea, vomiting, constipation, and GU symptoms, but denies hematemesis, diarrhea, blood in the stool - melena, fever, weight loss, dysuria, chest pain, cardiac symptoms, cough, and shortness of breath.  The pain is described as sharp, constant, and radiating.  BM 1-2 per week, hard stools.  Has not tried anything for bowels.  c/o daily heartburn & indigestion.    Current Problems (verified): 1)  Gerd  (ICD-530.81) 2)  COPD  (ICD-496) 3)  Hx of Leukocytosis  (ICD-288.60) 4)  Hx of Seizure Disorder  (ICD-780.39) 5)  Bipolar Affective Disorder  (ICD-296.80) 6)  Osteoarthritis, Mild  (ICD-715.90) 7)  Abdominal Pain, Left Lower Quadrant  (ICD-789.04) 8)  Constipation, Chronic  (ICD-564.09) 9)  Nausea  (ICD-787.02) 10)  Headache  (ICD-784.0) 11)  Nicotine Addiction  (ICD-305.1) 12)  Obesity, Unspecified  (ICD-278.00) 13)  Fatigue  (ICD-780.79) 14)  Knee Pain, Right, Acute  (ICD-719.46) 15)  Hyperlipidemia  (ICD-272.4) 16)  Hypokalemia  (ICD-276.8) 17)  Costochondritis, Left  (ICD-733.6) 18)  Acute Lymphadenitis  (ICD-683) 19)  Back Pain, Chronic  (ICD-724.5) 20)  Essential Hypertension  (ICD-401.9)  Current Medications (verified): 1)  Lamictal 150 Mg Tabs (Lamotrigine) .... Take 1 Tablet By Mouth Two Times A Day 2)  Alprazolam 1 Mg Tabs (Alprazolam) .... Take One Tab in The Am and Two Tabs The Pm 3)  Flexeril 10 Mg  Tabs (Cyclobenzaprine Hcl) .... Take 1 Tab By Mouth At Bedtime 4)  Maxzide-25 37.5-25 Mg Tabs (Triamterene-Hctz) .... Take 1 Tablet By Mouth Once A Day 5)  Simvastatin 20 Mg Tabs (Simvastatin) .... One Tab By Mouth Qhs 6)  Klor-Con M20 20 Meq Cr-Tabs (Potassium Chloride Crys Cr) .... Take 1 Tablet By Mouth Two Times A Day 7)  Promethazine Hcl 25 Mg Tabs (Promethazine Hcl) .... Take 1 Tablet By Mouth Three Times A Day As Needed 8)  Thiothixene 2 Mg Caps (Thiothixene) .... One Cap By Mouth Qhs 9)  Omeprazole 20 Mg Tbec (Omeprazole) .... One By Mouth Daily 30 Min Before Breakfast 10)  Miralax  Powd (Polyethylene Glycol 3350) .Marland KitchenMarland KitchenMarland Kitchen 17 Grams By Mouth Daily As Needed For Constipation  Allergies: 1)  ! Iodine 2)  ! Depakote 3)  ! Codeine 4)  ! Lithium Carbonate (Lithium Carbonate) 5)  Pcn  Past History:  Past Medical History: Hypertension  Arthritis Depression/Anxiety, HOSPITALISED FOR MENTAL HEALTH PROBS  2007, Dr Thomasena Edis follows her Seizures Migraines Chronic back pain Hx of LEUKOCYTOSIS (ICD-288.60) Hx of SEIZURE DISORDER (ICD-780.39) BIPOLAR AFFECTIVE DISORDER (ICD-296.80) OSTEOARTHRITIS, MILD (ICD-715.90) ABDOMINAL PAIN, LEFT LOWER QUADRANT (ICD-789.04) CONSTIPATION, CHRONIC (ICD-564.09) NAUSEA (ICD-787.02) NICOTINE ADDICTION (ICD-305.1) OBESITY, UNSPECIFIED (ICD-278.00) FATIGUE (ICD-780.79) KNEE PAIN, RIGHT, ACUTE (ICD-719.46) HYPERLIPIDEMIA (ICD-272.4) HYPOKALEMIA (ICD-276.8) COPD  Family History: Mother-(58) living-no chronic conditions Father-(58) Living, HTN, COPD Brother- arthritis No known family history of colorectal carcinoma, IBD, liver or chronic GI problems.  Social History: Occupation-disabled, previous waitress Single, divorced x3, lives w  friend Current Smoker `1/2 ppd, 22pkyr hx  Alcohol use-no, quit 11 yrs ago Drug use-no quit crack x 11 years TWO CHILDREN AGES 17 AND 19  Review of Systems General:  Complains of sweats and anorexia; denies fever,  chills, fatigue, weakness, malaise, weight loss, and sleep disorder. CV:  Denies chest pains, angina, palpitations, syncope, dyspnea on exertion, orthopnea, PND, peripheral edema, and claudication. Resp:  Denies dyspnea at rest, dyspnea with exercise, cough, sputum, wheezing, coughing up blood, and pleurisy. GI:  Complains of indigestion/heartburn; denies difficulty swallowing, pain on swallowing, jaundice, bloody BM's, black BMs, and fecal incontinence.  Vital Signs:  Patient profile:   40 year old female Menstrual status:  hysterectomy Height:      63 inches Weight:      177 pounds BMI:     31.47 Temp:     97 degrees F oral Pulse rate:   84 / minute BP sitting:   122 / 80  (left arm) Cuff size:   regular  Vitals Entered By: Hendricks Limes LPN (February 11, 2009 9:48 AM)  Physical Exam  General:  Well developed, well nourished, no acute distress. Head:  Normocephalic and atraumatic. Eyes:  Sclera clear, no icterus. Ears:  Normal auditory acuity. Nose:  No deformity, discharge,  or lesions.  Nasal ring. Mouth:  No deformity or lesions, dentition normal. Neck:  Supple; no masses or thyromegaly. Lungs:  Clear throughout to auscultation. Heart:  Regular rate and rhythm; no murmurs, rubs,  or bruits. Abdomen:  umbilical ornament, normal bowel sounds, obese, striated, LLQ tenderness, without guarding, without rebound, no hernia, no distesion, no masses, and no hepatomegally or splenomegaly.   Msk:  Symmetrical with no gross deformities. Normal posture. Pulses:  Normal pulses noted. Extremities:  No clubbing, cyanosis, edema or deformities noted. Neurologic:  Alert and  oriented x4;  grossly normal neurologically. Skin:  multiple piercings/tattoos Cervical Nodes:  No significant cervical adenopathy. Psych:  easily distracted and poor concentration.    Impression & Recommendations:  Problem # 1:  ABDOMINAL PAIN, LEFT LOWER QUADRANT (ICD-789.04) 40 y/o caucasian female with persistent  intermittant LLQ abd pain, chronic constipation, and intermittat N/V usually associated with hypokalemia.  Hx non-compliance with KCl pills.  Pain may be related to chronic constipation but I feel we should r/o diverticulitis or ovarian etiology given severity of pain and hx leukocytosis.  Orders: CT Abdomen/Pelvis with IV/Oral Contrast (CT A/P w IV/Oral)  Problem # 2:  NAUSEA (ICD-787.02) Differentials include hypokalemia, medication side effect, gastroparesis, poorly-controlled GERD, and less likely PUD.  Problem # 3:  CONSTIPATION, CHRONIC (ICD-564.09) See #1  Problem # 4:  GERD (ICD-530.81) Needs to begin PPI, DC H2 blockers due to iadequate control.  Problem # 5:  HYPOKALEMIA (ICD-276.8) Followed & treated per PCP  Problem # 6:  Hx of LEUKOCYTOSIS (ICD-288.60) Assessment: Comment Only  Other Orders: Consultation Level IV (11914)  Patient Instructions: 1)  Stop Ranitidine, begin omeprazole 2)  Take Miralax 17 grams every day unless you devolp diarrhea 3)  I will call with test results. 4)  To ER if severe pain or can't keep anything down. 5)  The medication list was reviewed and reconciled.  All changed / newly prescribed medications were explained.  A complete medication list was provided to the patient / caregiver. Prescriptions: MIRALAX  POWD (POLYETHYLENE GLYCOL 3350) 17 grams by mouth daily as needed for constipation  #527 grams x 5   Entered and Authorized by:   Joselyn Arrow FNP-BC  Signed by:   Joselyn Arrow FNP-BC on 02/11/2009   Method used:   Electronically to        [facility]. [address] [phone]* (retail)       [address] [place], [place]  [address]       Ph: [phone]       Fax: [phone]   RxID:   [id number] OMEPRAZOLE 20 MG TBEC (OMEPRAZOLE) one by mouth daily 30 min before breakfast  #30 x 5   Entered and Authorized by:   Joselyn Arrow FNP-BC   Signed by:   Joselyn Arrow FNP-BC on 02/11/2009   Method used:   Electronically to         [facility] [address] [phone]* (retail)       [address] [place], [place]  [address]       Ph: [phone]       Fax: [phone]   RxID:   [id number]   Appended Document: abd pain/ams Did pt get CT done as scheduled yesterday?  Appended Document: abd pain/ams Pt didn't show up for her CT Scan.I called her and lmom for her to give our office a call about this matter.

## 2010-05-23 NOTE — Letter (Signed)
Summary: RELEASE INFORMATION  RELEASE INFORMATION   Imported By: Lind Guest 01/24/2009 11:17:18  _____________________________________________________________________  External Attachment:    Type:   Image     Comment:   External Document

## 2010-05-23 NOTE — Progress Notes (Signed)
Summary: infection  Phone Note Call from Patient   Summary of Call: has really bad kidney infection. [phone] Initial call taken by: Rudene Anda,  January 27, 2009 10:31 AM  Follow-up for Phone Call        patient coming to submit urine specimen for ccua complains of burning with urination Follow-up by: Worthy Keeler LPN,  January 27, 2009 10:41 AM

## 2010-05-23 NOTE — Progress Notes (Signed)
Summary: CALL BACK  Phone Note Call from Patient   Summary of Call: NEEDS FOR U TO CALL  HER BACK ABOUT THE FLEXRILL [phone] Initial call taken by: Lind Guest,  April 26, 2009 11:22 AM  Follow-up for Phone Call        Already another phone note pertaining to this matter. See previous note Follow-up by: Everitt Amber,  April 26, 2009 12:52 PM

## 2010-05-23 NOTE — Progress Notes (Signed)
Summary: LABS DONE  Phone Note Call from Patient   Summary of Call: WANTED YOU TO KNOW THAT SHE DONE HER LABS Initial call taken by: Lind Guest,  March 28, 2009 9:48 AM  Follow-up for Phone Call        noted Follow-up by: Syliva Overman MD,  March 28, 2009 11:47 AM

## 2010-05-23 NOTE — Letter (Signed)
Summary: CT ABD PELVIS ORDER  CT SBD PELVIS ORDER   Imported By: Elinor Parkinson 02/11/2009 12:32:06  _____________________________________________________________________  External Attachment:    Type:   Image     Comment:   External Document

## 2010-05-23 NOTE — Progress Notes (Signed)
Summary: STAT LABS  Phone Note Call from Patient   Summary of Call: Brooke Maddox AT [phone] STAT LABS Initial call taken by: Lind Guest,  January 05, 2009 3:59 PM  Follow-up for Phone Call        aware and patient aware Follow-up by: Worthy Keeler LPN,  January 05, 2009 4:38 PM

## 2010-05-23 NOTE — Assessment & Plan Note (Signed)
Summary: OV   Vital Signs:  Patient profile:   40 year old female Menstrual status:  hysterectomy Height:      63 inches Weight:      170.19 pounds BMI:     30.26 O2 Sat:      98 % Pulse rate:   94 / minute Pulse rhythm:   regular Resp:     16 per minute BP sitting:   140 / 94  (left arm) Cuff size:   regular  Vitals Entered By: Everitt Amber (November 04, 2008 9:59 AM)  Nutrition Counseling: Patient's BMI is greater than 25 and therefore counseled on weight management options. CC: Follow up from ER   CC:  Follow up from ER.  History of Present Illness: Pt was in the Ed yesterday and diagnosed with chest wall pain and senthome. She is grieving the recent loss of her adopted mother in the past 1 month and is seeing psychiatry regularly. She states her potassium was again low last night, and she had not been taking tyhe potassium, cannot swallow the opills. She denies any recent fever or chills, head or chest congestion, dysuria or frequency.  Current Medications (verified): 1)  Lamictal 150 Mg Tabs (Lamotrigine) .... Take 1 Tablet By Mouth Two Times A Day 2)  Mirtazapine 15 Mg Tabs (Mirtazapine) .... Take 1 Tablet By Mouth Once A Day 3)  Alprazolam 1 Mg Tabs (Alprazolam) .... Take One Tab in The Am and Two Tabs The Pm 4)  Ibuprofen 800 Mg Tabs (Ibuprofen) .... Take 1 Tablet By Mouth Three Times A Day 5)  Flexeril 10 Mg Tabs (Cyclobenzaprine Hcl) .... Take 1 Tab By Mouth At Bedtime 6)  Hydrochlorothiazide 12.5 Mg Tabs (Hydrochlorothiazide) .... One Tab By Mouth Qd 7)  Klor-Con M20 20 Meq Cr-Tabs (Potassium Chloride Crys Cr) .... One Tab By Mouth Qd  Allergies (verified): 1)  ! Iodine 2)  ! Depakote 3)  ! Codeine 4)  Pcn  Review of Systems      See HPI General:  Complains of fatigue and sleep disorder; denies chills and fever. ENT:  Denies hoarseness, nasal congestion, and sinus pressure. CV:  Denies chest pain or discomfort, difficulty breathing while lying down,  palpitations, and swelling of feet. Resp:  Denies cough and sputum productive. GI:  Denies abdominal pain, constipation, diarrhea, nausea, and vomiting. GU:  Denies dysuria and urinary frequency. MS:  Complains of joint pain, low back pain, mid back pain, and stiffness. Derm:  Denies itching and rash. Psych:  Complains of anxiety, depression, easily tearful, irritability, and mental problems; denies suicidal thoughts/plans, thoughts of violence, and unusual visions or sounds.  Physical Exam  General:  alert, well-hydrated, and overweight-appearing.Tearful and anxious. HEENT: No facial asymmetry,  EOMI, No sinus tenderness, TM's Clear, oropharynx  pink and moist.   Chest: Clear to auscultation bilaterally.  CVS: S1, S2, No murmurs, No S3.   Abd: Soft, Nontender.  MS: Adequate ROM spine, hips, shoulders and knees.  Ext: No edema.   CNS: CN 2-12 intact, power tone and sensation normal throughout.   Skin: Intact, no visible lesions or rashes.  Psych: poor eye contact,ab normal affect.  Memory slightly impaired.   Impression & Recommendations:  Problem # 1:  HYPERLIPIDEMIA (ICD-272.4) Assessment Comment Only  Her updated medication list for this problem includes:    Lipitor 20 Mg Tabs (Atorvastatin calcium) .Marland Kitchen... Take 1 tab by mouth at bedtime    HDL:31 (09/16/2008)  LDL:137 (09/16/2008)  Chol:220 (09/16/2008)  Trig:261 (  09/16/2008)  Problem # 2:  ESSENTIAL HYPERTENSION (ICD-401.9) Assessment: Deteriorated  The following medications were removed from the medication list:    Hydrochlorothiazide 12.5 Mg Tabs (Hydrochlorothiazide) ..... One tab by mouth qd Her updated medication list for this problem includes:    Maxzide-25 37.5-25 Mg Tabs (Triamterene-hctz) .Marland Kitchen... Take 1 tablet by mouth once a day  BP today: 140/94 Prior BP: 120/82 (10/15/2008)  Labs Reviewed: K+: 3.6 (10/20/2008) Creat: : 0.69 (09/16/2008)   Chol: 220 (09/16/2008)   HDL: 31 (09/16/2008)   LDL: 137 (09/16/2008)    TG: 261 (09/16/2008)  Problem # 3:  HYPOKALEMIA (ICD-276.8) pt reprts tha t at a recent Ed visitshe was again told that her potassium was low, she had d/c the potassim supp once she was told that the potassium was nl. She is to complete the potassium which she currently has, and is to change to maxzide which is a sparer.  Problem # 4:  BACK PAIN, CHRONIC (ICD-724.5) Assessment: Comment Only  The following medications were removed from the medication list:    Oxycodone-acetaminophen 5-325 Mg Tabs (Oxycodone-acetaminophen) .Marland Kitchen... Take 1-2 tabs every 6 hours as needed Her updated medication list for this problem includes:    Ibuprofen 800 Mg Tabs (Ibuprofen) .Marland Kitchen... Take 1 tablet by mouth three times a day    Flexeril 10 Mg Tabs (Cyclobenzaprine hcl) .Marland Kitchen... Take 1 tab by mouth at bedtime conside referral to a apin clinis she has a tensunit, I anm attempting to get old records.reports dignificant back pain  Complete Medication List: 1)  Lamictal 150 Mg Tabs (Lamotrigine) .... Take 1 tablet by mouth two times a day 2)  Mirtazapine 15 Mg Tabs (Mirtazapine) .... Take 1 tablet by mouth once a day 3)  Alprazolam 1 Mg Tabs (Alprazolam) .... Take one tab in the am and two tabs the pm 4)  Ibuprofen 800 Mg Tabs (Ibuprofen) .... Take 1 tablet by mouth three times a day 5)  Flexeril 10 Mg Tabs (Cyclobenzaprine hcl) .... Take 1 tab by mouth at bedtime 6)  Maxzide-25 37.5-25 Mg Tabs (Triamterene-hctz) .... Take 1 tablet by mouth once a day 7)  Lipitor 20 Mg Tabs (Atorvastatin calcium) .... Take 1 tab by mouth at bedtime  Patient Instructions: 1)  F/U in6 weeks. 2)  You will be referred to a pain clinic for continued pain management. 3)  Your blood pressure  pressure is still too high.PLS stop the hydrochlorthiazide, and finish the potassium you already have taking 1 every day, after that you do not need to refill since your new pill maxzide holds potassium. 4)  You will be started on a new BP pill and also  med for your cholesterol.Also pls follow a low fat diet. 5)  Start regular walking and cut down calories to assist with weight loss and improvwedhealth. Prescriptions: LIPITOR 20 MG TABS (ATORVASTATIN CALCIUM) Take 1 tab by mouth at bedtime  #30 x 2   Entered and Authorized by:   Syliva Overman MD   Signed by:   Syliva Overman MD on 11/04/2008   Method used:   Electronically to        [facility] [address] [phone]* (retail)       [address] [place], [place]  [address]       Ph: [phone]       Fax: [phone]   RxID:   [id number] MAXZIDE-25 37.5-25 MG TABS (TRIAMTERENE-HCTZ) Take 1 tablet by mouth once a day  #  30 x 2   Entered and Authorized by:   Syliva Overman MD   Signed by:   Syliva Overman MD on 11/04/2008   Method used:   Electronically to        [facility] [phone]* (retail)       [address], [place]  [address]       Ph: [phone]       Fax: [phone]   RxID:   [id number]

## 2010-05-23 NOTE — Progress Notes (Signed)
  Phone Note Call from Patient   Summary of Call: Had a reaction to the sulfar antibiotic so she stopped taking it. Wanted to see if she could get a rx for ibuprofen since it helped more than the tylenol for the pain Initial call taken by: Everitt Amber,  September 27, 2008 11:15 AM  Follow-up for Phone Call        patient has a slight rash on arms from antibiotic, was advised to stop taking and can use otc hydrocortisone cream for rash and itching, needs different antibiotic Follow-up by: Worthy Keeler LPN,  September 27, 1608 11:40 AM  Additional Follow-up for Phone Call Additional follow up Details #1::        advise ibuprofen was already sent in, check record, also doc the rash  as adverse reaction , but let her know that does not seem like an allergic reaction , she has had 2 bad rxns, no further abx till re-eval (not before next week) if she wants appt for re-eval refer her scheduling pls Additional Follow-up by: Syliva Overman MD,  September 27, 2008 12:52 PM    Additional Follow-up for Phone Call Additional follow up Details #2::    ibuprofen was only #30 and advised to take three times a day  Follow-up by: Worthy Keeler LPN,  September 28, 9602 1:45 PM  Additional Follow-up for Phone Call Additional follow up Details #3:: Details for Additional Follow-up Action Taken: AGREE, IF NEEDS REFILL THEN OK Additional Follow-up by: Syliva Overman MD,  September 27, 2008 2:34 PM    Prescriptions: IBUPROFEN 800 MG TABS (IBUPROFEN) Take 1 tablet by mouth three times a day  #30 x 0   Entered by:   Worthy Keeler LPN   Authorized by:   Syliva Overman MD   Signed by:   Worthy Keeler LPN on 54/12/8117   Method used:   Electronically to        [facility] [address] [phone]* (retail)       [address], [place]  [address]       Ph: [phone]       Fax: [phone]   RxID:   [id number]

## 2010-05-23 NOTE — Assessment & Plan Note (Signed)
Summary: ear pain - room 1   Vital Signs:  Patient profile:   40 year old female Menstrual status:  hysterectomy Height:      63 inches Weight:      163.50 pounds BMI:     29.07 O2 Sat:      97 % on Room air Pulse rate:   85 / minute Resp:     16 per minute BP sitting:   120 / 82  (left arm)  Vitals Entered By: Adella Hare LPN (Sep 12, 2009 4:17 PM) CC: left ear pain- dizziness Is Patient Diabetic? No Pain Assessment Patient in pain? no      Comments did not bring meds to ov   Referring Provider:  Dr. Judie Petit. Simpson Primary Provider:  Dr. Berenice Primas  CC:  left ear pain- dizziness.  History of Present Illness: Pt presents today with c/o Bilat ear pain x 2-3 days.  This seems to making her feel off balance off & on. She also has been having green nasal congestion and sinus pressure.  She denies fever.  Has been having some blood in her nasal mucus , and has a prod cough.  She is not taking any over the counter cold or cough medicine.  Pt states she has cut back to 1/2 pack of cigs per day.  Her ins didnt cover the prescription for the patches.  She is considering bying something over the counter to help her quit.   Allergies (verified): 1)  ! Iodine 2)  ! Depakote 3)  ! Codeine 4)  ! Lithium Carbonate (Lithium Carbonate) 5)  Pcn  Past History:  Past medical history reviewed for relevance to current acute and chronic problems.  Past Medical History: Reviewed history from 02/11/2009 and no changes required. Hypertension  Arthritis Depression/Anxiety, HOSPITALISED FOR MENTAL HEALTH PROBS  2007, Dr Thomasena Edis follows her Seizures Migraines Chronic back pain Hx of LEUKOCYTOSIS (ICD-288.60) Hx of SEIZURE DISORDER (ICD-780.39) BIPOLAR AFFECTIVE DISORDER (ICD-296.80) OSTEOARTHRITIS, MILD (ICD-715.90) ABDOMINAL PAIN, LEFT LOWER QUADRANT (ICD-789.04) CONSTIPATION, CHRONIC (ICD-564.09) NAUSEA (ICD-787.02) NICOTINE ADDICTION (ICD-305.1) OBESITY, UNSPECIFIED  (ICD-278.00) FATIGUE (ICD-780.79) KNEE PAIN, RIGHT, ACUTE (ICD-719.46) HYPERLIPIDEMIA (ICD-272.4) HYPOKALEMIA (ICD-276.8) COPD  Review of Systems General:  Denies chills and fever. ENT:  Complains of earache, nasal congestion, postnasal drainage, and sinus pressure; denies ear discharge and sore throat. CV:  Denies chest pain or discomfort. Resp:  Complains of cough and sputum productive; denies shortness of breath.  Physical Exam  General:  Well-developed,well-nourished,in no acute distress; alert,appropriate and cooperative throughout examination Head:  Normocephalic and atraumatic without obvious abnormalities. No apparent alopecia or balding. Ears:  External ear exam shows no significant lesions or deformities.  Otoscopic examination reveals clear canals, tympanic membranes are intact bilaterally without bulging, retraction, inflammation or discharge. Hearing is grossly normal bilaterally. Nose:  External nasal examination shows no deformity or inflammation. Nasal mucosa are pink and moist without lesions or exudates.L frontal sinus tenderness, L maxillary sinus tenderness, R frontal sinus tenderness, and R maxillary sinus tenderness.   Mouth:  Oral mucosa and oropharynx without lesions or exudates.   Neck:  No deformities, masses, or tenderness noted. Lungs:  Normal respiratory effort, chest expands symmetrically. Lungs are clear to auscultation, no crackles or wheezes. Heart:  Normal rate and regular rhythm. S1 and S2 normal without gallop, murmur, click, rub or other extra sounds. Cervical Nodes:  No lymphadenopathy noted Psych:  Cognition and judgment appear intact. Alert and cooperative with normal attention span and concentration. No apparent delusions, illusions,  hallucinations   Impression & Recommendations:  Problem # 1:  ACUTE SINUSITIS, UNSPECIFIED (ICD-461.9) Assessment New  Her updated medication list for this problem includes:    Zithromax Z-pak 250 Mg Tabs  (Azithromycin) .Marland Kitchen... Take once daily as directed    Tessalon Perles 100 Mg Caps (Benzonatate) .Marland Kitchen... Take 1 every 6 hrs as needed for cough  Problem # 2:  NICOTINE ADDICTION (ICD-305.1) Assessment: Comment Only  The following medications were removed from the medication list:    Nicoderm Cq 7 Mg/24hr Pt24 (Nicotine) .Marland Kitchen... Apply 1 patch daily  Complete Medication List: 1)  Lamictal 150 Mg Tabs (Lamotrigine) .... Take 1 tablet by mouth two times a day 2)  Alprazolam 1 Mg Tabs (Alprazolam) .... Take one tab in the am and two tabs the pm 3)  Maxzide-25 37.5-25 Mg Tabs (Triamterene-hctz) .... Take one and a half tablet daily 4)  Simvastatin 20 Mg Tabs (Simvastatin) .... One tab by mouth qhs 5)  Miralax Powd (Polyethylene glycol 3350) .Marland KitchenMarland KitchenMarland Kitchen 17 grams by mouth daily as needed for constipation 6)  Potassium Chloride 20 Meq/17ml (10%) Liqd (Potassium chloride) .... 30 cc two times a day 7)  Dicyclomine Hcl 10 Mg Caps (Dicyclomine hcl) .... Take 1 capsule by mouth two times a day  as needed 8)  Spiriva Handihaler 18 Mcg Caps (Tiotropium bromide monohydrate) .... One inhalation daily 9)  Prozac 20 Mg Caps (Fluoxetine hcl) .... One cap by mouth once daily 10)  Thiothixene 10 Mg Caps (Thiothixene) .... One cap by mouth qhs 11)  Methocarbamol 500 Mg Tabs (Methocarbamol) .... Take 2 tabs every 6 hrs as needed for muscle spasm 12)  Zithromax Z-pak 250 Mg Tabs (Azithromycin) .... Take once daily as directed 13)  Tessalon Perles 100 Mg Caps (Benzonatate) .... Take 1 every 6 hrs as needed for cough  Patient Instructions: 1)  Please schedule a follow-up appointment as needed. 2)  Tobacco is very bad for your health and your loved ones! You Should stop smoking!. 3)  Stop Smoking Tips: Choose a Quit date. Cut down before the Quit date. decide what you will do as a substitute when you feel the urge to smoke(gum,toothpick,exercise). 4)  Congratulations on your weight loss.  5)  I have prescribed an antibiotic for  you and some cough medicine. 6)  You may use Tylenol or Ibuprofen as needed for your ear pain. Prescriptions: TESSALON PERLES 100 MG CAPS (BENZONATATE) take 1 every 6 hrs as needed for cough  #30 x 0   Entered and Authorized by:   Esperanza Sheets PA   Signed by:   Esperanza Sheets PA on 09/12/2009   Method used:   Electronically to        [facility] [phone]* (retail)       [address] [place], [place]  [address]       Ph: [phone]       Fax: [phone]   RxID:   [id number] ZITHROMAX Z-PAK 250 MG TABS (AZITHROMYCIN) take once daily as directed  #1 x 0   Entered and Authorized by:   Esperanza Sheets PA   Signed by:   Esperanza Sheets PA on 09/12/2009   Method used:   Electronically to        [facility] [phone]* (retail)       [address] [place]  [address]       Ph: [phone]  Fax: [phone]   RxID:   [id number]

## 2010-05-23 NOTE — Miscellaneous (Signed)
  Clinical Lists Changes  Orders: Added new Test order of T-Potassium  [phone]) - Signed

## 2010-05-23 NOTE — Assessment & Plan Note (Signed)
Summary: hospital follow up- room 2   Vital Signs:  Patient profile:   40 year old female Menstrual status:  hysterectomy Height:      63 inches Weight:      169.25 pounds BMI:     30.09 O2 Sat:      98 % on Room air Pulse rate:   79 / minute Resp:     16 per minute BP sitting:   102 / 70  (left arm)  Vitals Entered By: Adella Hare LPN (Aug 30, 6431 10:13 AM) CC: follow up from hospital Is Patient Diabetic? No Pain Assessment Patient in pain? yes     Location: right hip Intensity: 10 Type: sharp Onset of pain  Chronic   Referring Provider:  Dr. Judie Petit. Simpson Primary Provider:  Dr. Berenice Primas  CC:  follow up from hospital.  History of Present Illness: Pt states "I have chronic pain in my hip."  Chronic LBP and hip pain per chart.  Has used Flexeril in past which works well, but has run out.  She would use this at bedtime.  Has a TENS unit that she uses as needed but states she needs more of the pad.  Intermittent radiation & numbness in RLE.  This is when she will use the TENS unit. Receives shots periodically when flares up & helps. Has seen orthopod in [place] in the past.  No change of bowel or bladder control. Has chronic bladder problem (? urethral stricture) which she see's urologist for & has what sounds like dilitation.  Pt states that she had an emotional breakdown a couple weeks ago.  Was seen in ER then transferred to an inpt facility.  She states she is doing much better now.  Is following up with her psychiatrist for this.  pt states that she has significantly cut down on her smoking.  Is now smoking 1 pack every 3 days.  States they gave her patches to use when inpt and they worked really well for her.  Was not discharged with anything though.  Current Medications (verified): 1)  Lamictal 150 Mg Tabs (Lamotrigine) .... Take 1 Tablet By Mouth Two Times A Day 2)  Alprazolam 1 Mg Tabs (Alprazolam) .... Take One Tab in The Am and Two Tabs The Pm 3)   Maxzide-25 37.5-25 Mg Tabs (Triamterene-Hctz) .... Take One and A Half Tablet Daily 4)  Simvastatin 20 Mg Tabs (Simvastatin) .... One Tab By Mouth Qhs 5)  Miralax  Powd (Polyethylene Glycol 3350) .Marland KitchenMarland KitchenMarland Kitchen 17 Grams By Mouth Daily As Needed For Constipation 6)  Potassium Chloride 20 Meq/43ml (10%) Liqd (Potassium Chloride) .... 30 Cc Two Times A Day 7)  Dicyclomine Hcl 10 Mg Caps (Dicyclomine Hcl) .... Take 1 Capsule By Mouth Two Times A Day  As Needed 8)  Spiriva Handihaler 18 Mcg Caps (Tiotropium Bromide Monohydrate) .... One Inhalation Daily 9)  Prozac 20 Mg Caps (Fluoxetine Hcl) .... One Cap By Mouth Once Daily 10)  Doxycycline Hyclate 100 Mg Caps (Doxycycline Hyclate) .... One Cap By Mouth Two Times A Day 11)  Thiothixene 10 Mg Caps (Thiothixene) .... One Cap By Mouth Qhs  Allergies (verified): 1)  ! Iodine 2)  ! Depakote 3)  ! Codeine 4)  ! Lithium Carbonate (Lithium Carbonate) 5)  Pcn  Past History:  Past medical history reviewed for relevance to current acute and chronic problems.  Past Medical History: Reviewed history from 02/11/2009 and no changes required. Hypertension  Arthritis Depression/Anxiety, HOSPITALISED FOR MENTAL  HEALTH PROBS  2007, Dr Thomasena Edis follows her Seizures Migraines Chronic back pain Hx of LEUKOCYTOSIS (ICD-288.60) Hx of SEIZURE DISORDER (ICD-780.39) BIPOLAR AFFECTIVE DISORDER (ICD-296.80) OSTEOARTHRITIS, MILD (ICD-715.90) ABDOMINAL PAIN, LEFT LOWER QUADRANT (ICD-789.04) CONSTIPATION, CHRONIC (ICD-564.09) NAUSEA (ICD-787.02) NICOTINE ADDICTION (ICD-305.1) OBESITY, UNSPECIFIED (ICD-278.00) FATIGUE (ICD-780.79) KNEE PAIN, RIGHT, ACUTE (ICD-719.46) HYPERLIPIDEMIA (ICD-272.4) HYPOKALEMIA (ICD-276.8) COPD  Review of Systems General:  Denies weakness. GI:  Denies change in bowel habits. GU:  Denies dysuria, hematuria, incontinence, and urinary frequency. MS:  Complains of low back pain; denies muscle aches and muscle weakness. Neuro:  Complains of  numbness and tingling. Psych:  Complains of mental problems.  Physical Exam  General:  Well-developed,well-nourished,in no acute distress; alert,appropriate and cooperative throughout examination Head:  Normocephalic and atraumatic without obvious abnormalities. No apparent alopecia or balding. Lungs:  Normal respiratory effort, chest expands symmetrically. Lungs are clear to auscultation, no crackles or wheezes. Heart:  Normal rate and regular rhythm. S1 and S2 normal without gallop, murmur, click, rub or other extra sounds. Msk:  LS Spine:  Flexion decreased to approx 30 degrees.  TTP Bilat lumbar paraspinal muscles, RT SI joint.  Nontender at RT hip or sciatic notch. Pulses:  R posterior tibial normal, R dorsalis pedis normal, L posterior tibial normal, and L dorsalis pedis normal.   Extremities:  No clubbing, cyanosis, edema, or deformity noted with normal full range of motion of all joints.   Neurologic:  alert & oriented X3, sensation intact to light touch, gait normal, and DTRs symmetrical and normal.   Psych:  Oriented X3, normally interactive, good eye contact, not anxious appearing, and not agitated.     Impression & Recommendations:  Problem # 1:  BACK PAIN, CHRONIC (ICD-724.5) Assessment Deteriorated Discussed with pt that because she is already on 2 sedating medications at bedtime, that I would prefer to give her a muscle relaxer that is not so sedating.  And she can use this during the daytime if needed.  Advised pt to call back with info for refilling TENS pads.  Her updated medication list for this problem includes:    Methocarbamol 500 Mg Tabs (Methocarbamol) .Marland Kitchen... Take 2 tabs every 6 hrs as needed for muscle spasm  Problem # 2:  BIPOLAR AFFECTIVE DISORDER (ICD-296.80) Assessment: Deteriorated Pt is under care of psychiatrist for this.  Problem # 3:  NICOTINE ADDICTION (ICD-305.1) Assessment: Improved Pt has significantly cut back & is motivated currently to  quit. Rx'd patches for her.  Discussed that even if her ins does not cover this she should check into  the over the counter ones since they worked so well for her.  Her updated medication list for this problem includes:    Nicoderm Cq 7 Mg/24hr Pt24 (Nicotine) .Marland Kitchen... Apply 1 patch daily  Complete Medication List: 1)  Lamictal 150 Mg Tabs (Lamotrigine) .... Take 1 tablet by mouth two times a day 2)  Alprazolam 1 Mg Tabs (Alprazolam) .... Take one tab in the am and two tabs the pm 3)  Maxzide-25 37.5-25 Mg Tabs (Triamterene-hctz) .... Take one and a half tablet daily 4)  Simvastatin 20 Mg Tabs (Simvastatin) .... One tab by mouth qhs 5)  Miralax Powd (Polyethylene glycol 3350) .Marland KitchenMarland KitchenMarland Kitchen 17 grams by mouth daily as needed for constipation 6)  Potassium Chloride 20 Meq/44ml (10%) Liqd (Potassium chloride) .... 30 cc two times a day 7)  Dicyclomine Hcl 10 Mg Caps (Dicyclomine hcl) .... Take 1 capsule by mouth two times a day  as needed 8)  Spiriva  Handihaler 18 Mcg Caps (Tiotropium bromide monohydrate) .... One inhalation daily 9)  Prozac 20 Mg Caps (Fluoxetine hcl) .... One cap by mouth once daily 10)  Doxycycline Hyclate 100 Mg Caps (Doxycycline hyclate) .... One cap by mouth two times a day 11)  Thiothixene 10 Mg Caps (Thiothixene) .... One cap by mouth qhs 12)  Methocarbamol 500 Mg Tabs (Methocarbamol) .... Take 2 tabs every 6 hrs as needed for muscle spasm 13)  Nicoderm Cq 7 Mg/24hr Pt24 (Nicotine) .... Apply 1 patch daily  Other Orders: Depo- Medrol 80mg  (J1040) Ketorolac-Toradol 15mg  (X9147) Admin of Therapeutic Inj  intramuscular or subcutaneous (82956)  Patient Instructions: 1)  Please schedule a follow-up appointment in 3 months. 2)  Tobacco is very bad for your health and your loved ones! You Should stop smoking!. 3)  Stop Smoking Tips: Choose a Quit date. Cut down before the Quit date. decide what you will do as a substitute when you feel the urge to smoke(gum,toothpick,exercise). 4)  It  is important that you exercise regularly at least 20 minutes 5 times a week. If you develop chest pain, have severe difficulty breathing, or feel very tired , stop exercising immediately and seek medical attention. 5)  You need to lose weight. Consider a lower calorie diet and regular exercise.  6)  I have prescribed a nicotine patch to help you stop smoking. 7)  I have prescribed a different muscle relaxer for you. 8)  You have received Depo Medrol today for inflammation and Toradol for pain. Prescriptions: NICODERM CQ 7 MG/24HR PT24 (NICOTINE) apply 1 patch daily  #1 mos x 0   Entered and Authorized by:   Esperanza Sheets PA   Signed by:   Esperanza Sheets PA on 08/29/2009   Method used:   Electronically to        [facility] [phone]* (retail)       [address] [place], [place]  [address]       Ph: [phone]       Fax: [phone]   RxID:   [id number] METHOCARBAMOL 500 MG TABS (METHOCARBAMOL) take 2 tabs every 6 hrs as needed for muscle spasm  #60 x 0   Entered and Authorized by:   Esperanza Sheets PA   Signed by:   Esperanza Sheets PA on 08/29/2009   Method used:   Electronically to        [facility] [address] [phone]* (retail)       [address] [place], [place]  [address]       Ph: [phone]       Fax: [phone]   RxID:   [id number]    Medication Administration  Injection # 1:    Medication: Depo- Medrol 80mg     Diagnosis: HIP PAIN, RIGHT (ICD-719.45)    Route: IM    Site: RUOQ gluteus    Exp Date: 1/12    Lot #: [id number] [id number]    Mfr: Pharmacia    Patient tolerated injection without complications    Given by: Adella Hare LPN (Aug 29, 3760 11:27 AM)  Injection # 2:    Medication: Ketorolac-Toradol 15mg     Diagnosis: HIP PAIN, RIGHT (ICD-719.45)    Route: IM    Site: LUOQ gluteus    Exp Date: 03/24/2011    Lot #: [id number]    Mfr: hospira    Comments: toradol 60mg  given    Patient tolerated injection  without complications    Given by: Adella Hare  LPN (Aug 29, 1608 11:28 AM)  Orders Added: 1)  Est. Patient Level IV [[address]] 2)  Depo- Medrol 80mg  [J1040] 3)  Ketorolac-Toradol 15mg  [J1885] 4)  Admin of Therapeutic Inj  intramuscular or subcutaneous [40981]

## 2010-05-23 NOTE — Miscellaneous (Signed)
Summary: flu shot  Clinical Lists Changes  Observations: Added new observation of FLU VAX: Historical (12/30/2008 11:52)      Influenza Immunization History:    Influenza # 1:  Historical (12/30/2008)

## 2010-05-23 NOTE — Progress Notes (Signed)
Summary: BP PILLS ASAP  Phone Note Call from Patient   Summary of Call: THE PHAR IS GONG TO FAX OVER HER BP MEDICINE AND NEEDS IT FILLED APPT. 10.3.11 NEEDS ASAP Initial call taken by: Lind Guest,  January 17, 2010 1:33 PM    Prescriptions: MAXZIDE-25 37.5-25 MG TABS (TRIAMTERENE-HCTZ) Take one and a half tablet daily  #45 Each x 0   Entered by:   Everitt Amber LPN   Authorized by:   Syliva Overman MD   Signed by:   Everitt Amber LPN on 04/54/0981   Method used:   Electronically to        [facility] [address] [phone]* (retail)       [address] [place]  [address]       Ph: [phone]       Fax: [phone]   RxID:   [id number]

## 2010-05-23 NOTE — Progress Notes (Signed)
Summary: MEDS  Phone Note Call from Patient   Summary of Call: NO REFILLS ON HER CHOL MEDS AND NEEDS THEM SENT TO [facility] Initial call taken by: Lind Guest,  May 17, 2009 9:36 AM  Follow-up for Phone Call        Rx Called In Follow-up by: Worthy Keeler LPN,  May 17, 2009 9:39 AM    Prescriptions: SIMVASTATIN 20 MG TABS (SIMVASTATIN) one tab by mouth qhs  #30 x 4   Entered by:   Worthy Keeler LPN   Authorized by:   Syliva Overman MD   Signed by:   Worthy Keeler LPN on 16/01/9603   Method used:   Electronically to        [facility] [address] [phone]* (retail)       [address], [place]  [address]       Ph: [phone]       Fax: [phone]   RxID:   [id number]

## 2010-05-23 NOTE — Progress Notes (Signed)
Summary: REFILL  Phone Note Call from Patient   Summary of Call: NEEDS HER FLEXERIL TODAY [facility]   CALL LET HER KNOW WHEN SEND OVER TODAY  CALL AT [phone] Initial call taken by: Lind Guest,  April 25, 2009 4:46 PM  Follow-up for Phone Call        called to see where pt is cramping because dr stopped flexeril and statrted dicyclomine in december for stomach cramps Follow-up by: Everitt Amber,  April 26, 2009 8:21 AM  Additional Follow-up for Phone Call Additional follow up Details #1::        Patient aware, she said that was fine Additional Follow-up by: Everitt Amber,  April 26, 2009 12:52 PM

## 2010-05-23 NOTE — Letter (Signed)
Summary: transferred records  transferred records   Imported By: Lind Guest 09/14/2009 13:55:54  _____________________________________________________________________  External Attachment:    Type:   Image     Comment:   External Document

## 2010-05-23 NOTE — Progress Notes (Signed)
Summary: [facility]  [facility]   Imported By: Lind Guest 11/18/2008 09:48:50  _____________________________________________________________________  External Attachment:    Type:   Image     Comment:   External Document

## 2010-05-23 NOTE — Assessment & Plan Note (Signed)
Summary: ov  Nurse Visit   Vital Signs:  Patient profile:   40 year old female Menstrual status:  hysterectomy O2 Sat:      95 % Pulse rate:   93 / minute Resp:     16 per minute BP sitting:   120 / 82  (left arm) Cuff size:   regular  Vitals Entered By: Everitt Amber (October 15, 2008 8:49 AM)  CC: Patient in for a BP check after ER visit  with c/o dizziness and numbness. It was WNL and no med changes at this time    Allergies: 1)  ! Iodine 2)  ! Depakote 3)  ! Codeine 4)  Pcn       Prescriptions: IBUPROFEN 800 MG TABS (IBUPROFEN) Take 1 tablet by mouth three times a day  #30 x 1   Entered by:   Everitt Amber   Authorized by:   Syliva Overman MD   Signed by:   Everitt Amber on 10/15/2008   Method used:   Printed then faxed to ...       [facility] [address] [phone]* (retail)       [address] [place], [place]  [address]       Ph: [phone]       Fax: [phone]   RxID:   [id number] KLOR-CON M20 20 MEQ CR-TABS (POTASSIUM CHLORIDE CRYS CR) one tab by mouth qd  #30 x 1   Entered by:   Everitt Amber   Authorized by:   Syliva Overman MD   Signed by:   Everitt Amber on 10/15/2008   Method used:   Printed then faxed to ...       [facility] [address] [phone]* (retail)       [address] [place], [place]  [address]       Ph: [phone]       Fax: [phone]   RxID:   [id number] ALPRAZOLAM 1 MG TABS (ALPRAZOLAM) take one tab in the am and two tabs the pm  #90 x 1   Entered by:   Everitt Amber   Authorized by:   Syliva Overman MD   Signed by:   Everitt Amber on 10/15/2008   Method used:   Printed then faxed to ...       [facility] [address] [phone]* (retail)       [address] [place], [place]  [address]       Ph: [phone]       Fax: [phone]   RxID:   [id number] MIRTAZAPINE 15 MG TABS (MIRTAZAPINE) Take 1 tablet by mouth once a day  #30 x 1   Entered by:   Everitt Amber   Authorized by:   Syliva Overman MD   Signed by:    Everitt Amber on 10/15/2008   Method used:   Printed then faxed to ...       [facility] [address] [phone]* (retail)       [address] [place], [place]  [address]       Ph: [phone]       Fax: [phone]   RxID:   [id number] LAMICTAL 150 MG TABS (LAMOTRIGINE) Take 1 tablet by mouth two times a day  #60 x 1   Entered by:   Everitt Amber   Authorized by:   Claris Che  Simpson MD   Signed by:   Everitt Amber on 10/15/2008   Method used:   Printed then faxed to ...       [facility] [address] [phone]* (retail)       [address] [place]  [address]       Ph: [phone]       Fax: [phone]   RxID:   [id number]

## 2010-05-23 NOTE — Letter (Signed)
Summary: Generic Letter, Intro to Referring  [facility]  [address]   [place], [place] [address]   Phone: [phone]  Fax: [phone]      August 01, 2009             RE: Brooke Maddox   May 28, 1970                 [address], [place]  [address]  Dear Kemper Durie,     The you referred the above mentioned patient to our office for GI consult. We have tried to contact the patient by phone and mail. She has not contacted our office for an appointment.             Sincerely,    [facility] Ph: [phone]   Fax: [phone]

## 2010-05-23 NOTE — Progress Notes (Signed)
Summary: speak with nurse  Phone Note Call from Patient   Summary of Call: still having problems with stomache. and wants to talk to nurse too about sugar Initial call taken by: Rudene Anda,  January 19, 2009 9:51 AM  Follow-up for Phone Call        stomach is still hurting, did eat some solid food last night, but still having pain. says [facility] draws blood for lithium levels and states her sugar is up and somthing with her kidneys. will have Dr Thomasena Edis to fax [place] bloodwork results. Follow-up by: Worthy Keeler LPN,  January 19, 2009 10:18 AM  Additional Follow-up for Phone Call Additional follow up Details #1::        noted will waiton results lastblood sugar andall fastings have been normal Additional Follow-up by: Syliva Overman MD,  January 19, 2009 4:52 PM    Additional Follow-up for Phone Call Additional follow up Details #2::    pls refer to gI for eval of abdominal pain Follow-up by: Syliva Overman MD,  January 19, 2009 4:53 PM  Additional Follow-up for Phone Call Additional follow up Details #3:: Details for Additional Follow-up Action Taken: pt has been referred to dr. Jeanella Flattery office. they will call her with the appt. pt notified Additional Follow-up by: Rudene Anda,  January 20, 2009 9:07 AM

## 2010-05-23 NOTE — Progress Notes (Signed)
Summary: MEDICINE  Phone Note Call from Patient   Summary of Call: CALL HER ABOUT HER MEDICINE  AT [phone] Initial call taken by: Lind Guest,  March 07, 2009 10:41 AM  Follow-up for Phone Call        Returned call, left message Follow-up by: Everitt Amber,  March 07, 2009 11:19 AM  Additional Follow-up for Phone Call Additional follow up Details #1::        her liquid potassium said dilute on it and she wanted to know if she was supposed to dilute Additional Follow-up by: Everitt Amber,  March 07, 2009 11:36 AM    Additional Follow-up for Phone Call Additional follow up Details #2::    check pharmamcy about thispls and let pt know Follow-up by: Syliva Overman MD,  March 07, 2009 12:40 PM  Additional Follow-up for Phone Call Additional follow up Details #3:: Details for Additional Follow-up Action Taken: 3:30 and spoke to [facility] and she said [facility] still hasn't contacted her yet. I advised her that when I spoke to them at 11:45 this morning that they said they were going to call the manufacturer because the directions wasn't clear. I told the pharmacist that I was about to go to lunch and ti call the patient at home when they found out something and Maxene said just now at 3:30 that she hasn't heard from them. She is not taking the potassium until she hears something from the pharamcy and I advised the pharmacist at 11:45 am that her potassium levels fall quickly and this needed to be done. Its 3:30 and [facility] hasn't recieved a call. Told her to call them back and see what they said. She's gonna call me back.  At 3:45, Trent called me back and said she spoke to the pharmacist and they said that the office manager wasn't there right now and they would have to be the ones to call the manufacturer to find out. She told them that she needed to know because this med was called in Thursday and she hasn't taken it yet because of the confusion. The pharmacist told her they would call her back before  5pm today Additional Follow-up by: Everitt Amber,  March 07, 2009 3:25 PM  was told to mix the 15ml with 3 cc's of water or juice per pharmacist

## 2010-05-23 NOTE — Progress Notes (Signed)
Summary: SWELLING  Phone Note Call from Patient   Summary of Call: SWELLING IN LEGS AND FEET AND CAN NOT PUT ON REGULAR SHOES WANTS YOU TO CALL HER AT  HOME    [phone] CELL   [phone]  AND YOU HAVE TO DIAL THE 336 ON HER CELL # Initial call taken by: Lind Guest,  October 22, 2008 10:00 AM  Follow-up for Phone Call        Advised her to go to urgent care or er before lunch when she called back.  Follow-up by: Everitt Amber,  October 22, 2008 3:26 PM

## 2010-05-23 NOTE — Progress Notes (Signed)
Summary: LAB ORDER  Phone Note Call from Patient   Summary of Call: WANTS TO KNOW DOES SHE NEED A LAB ORDER  SHE HAS APPT. TOMORROW AND SHE HAS A PUS LOOKING STUFF IN HER POOP CALL HER BACK AT [phone] TO LET HER KNOW  Initial call taken by: Lind Guest,  January 11, 2009 11:10 AM  Follow-up for Phone Call        will be addresed at [facility], I had already requested a potssium Follow-up by: Syliva Overman MD,  January 11, 2009 12:17 PM  Additional Follow-up for Phone Call Additional follow up Details #1::        Phone Call Completed Additional Follow-up by: Worthy Keeler LPN,  January 11, 2009 12:21 PM

## 2010-05-23 NOTE — Progress Notes (Signed)
Summary: MEDICINE  Phone Note Call from Patient   Summary of Call: THE MEDICINE THAT YOU SEND IN FOR HER  THAT WAS ON THE CARD BOARD BOX WITH 3 PILLS SHE HAS LOST 1 PILL AND NEEDS FOR YOU TO CALL IN 1 PILL FOR THAT MEDICINE  [facility] CALL HER BACK TO LET HER KNOW Initial call taken by: Lind Guest,  February 09, 2009 9:38 AM  Follow-up for Phone Call        Lucyle said that her 3 pills that you sent her, her dog ate one and she needs the one [facility]. I told her the med was for yeast infection and she should be fine with just the two but she said "should be"?? she wants the other pill sent in because she still has the burn. [facility] Follow-up by: Everitt Amber,  February 09, 2009 9:58 AM  Additional Follow-up for Phone Call Additional follow up Details #1::        pls erx diflucan 150mg  one only let her know i cannot replace tabs the dog eats, she needs to keep her tabs from the dogpls Additional Follow-up by: Syliva Overman MD,  February 09, 2009 11:06 AM    Additional Follow-up for Phone Call Additional follow up Details #2::    patient aware Follow-up by: Everitt Amber,  February 09, 2009 11:16 AM  Prescriptions: FLUCONAZOLE 150 MG TABS (FLUCONAZOLE) Take 1 tablet by mouth once a day as needed  #1 x 0   Entered by:   Everitt Amber   Authorized by:   Syliva Overman MD   Signed by:   Everitt Amber on 02/09/2009   Method used:   Electronically to        [facility] [address] [phone]* (retail)       [address] [place], [place]  [address]       Ph: [phone]       Fax: [phone]   RxID:   [id number]

## 2010-05-23 NOTE — Progress Notes (Signed)
Summary: BP  Phone Note Call from Patient   Summary of Call: WANTING YOU TO CALL HER AT [phone] BP IS 160/96 DIZZY  SEEING DOUBLE   HEADACHE Initial call taken by: Lind Guest,  October 14, 2008 2:00 PM  Follow-up for Phone Call        HAS ALREADY SENT THE FIRST MESSAGE TO DR. Lodema Hong Follow-up by: Everitt Amber,  October 14, 2008 2:14 PM

## 2010-05-23 NOTE — Miscellaneous (Signed)
Summary: Immunizations  Immunizations   Imported By: Lind Guest 01/03/2009 14:54:08  _____________________________________________________________________  External Attachment:    Type:   Image     Comment:   External Document

## 2010-05-23 NOTE — Assessment & Plan Note (Signed)
Summary: office visit   Vital Signs:  Patient profile:   40 year old female Menstrual status:  hysterectomy Height:      63 inches Weight:      172 pounds BMI:     30.58 O2 Sat:      98 % Pulse rate:   73 / minute Pulse rhythm:   regular Resp:     16 per minute BP sitting:   120 / 80  (right arm) Cuff size:   regular  Vitals Entered By: Everitt Amber (May 24, 2009 3:36 PM)  Nutrition Counseling: Patient's BMI is greater than 25 and therefore counseled on weight management options. CC: Follow up chronic problems, hsving aches in back and wants potassium checked because she has started to throw up again   Primary Care Provider:  Dr. Berenice Primas  CC:  Follow up chronic problems and hsving aches in back and wants potassium checked because she has started to throw up again.  History of Present Illness: Reports  thatshe has not been doing well. She has recently agaiin started vomitting, and is concerned about the possibility of hypokalemia.she also has chronic disabling low back pIN, AND IS REQUESTING AN INJECTION FOR this. Her nicotine useis unchanged, her breathing is deteriorating, she wants to trey to quit. Denies recent fever or chills. Denies sinus pressure, nasal congestion , ear pain or sore throat. Denies chest congestion, or cough productive of sputum. Denies chest pain, palpitations, PND, orthopnea or leg swelling. Denies abdominal pain,  diarrhea or constipation.Reports good response to dicyclomine Denies change in bowel movements or bloody stool. Denies dysuria , frequency, incontinence or hesitancy. Denies  joint pain, swelling, or reduced mobility. Denies headaches, vertigo, seizures. reports chronic  depression and  anxiety, but continues treatment through mental health , which she has done for yrs. Denies  rash, lesions, or itch.     Preventive Screening-Counseling & Management  Alcohol-Tobacco     Smoking Cessation Counseling: yes  Current Medications  (verified): 1)  Lamictal 150 Mg Tabs (Lamotrigine) .... Take 1 Tablet By Mouth Two Times A Day 2)  Alprazolam 1 Mg Tabs (Alprazolam) .... Take One Tab in The Am and Two Tabs The Pm 3)  Maxzide-25 37.5-25 Mg Tabs (Triamterene-Hctz) .... Take One and A Half Tablet Daily 4)  Simvastatin 20 Mg Tabs (Simvastatin) .... One Tab By Mouth Qhs 5)  Thiothixene 2 Mg Caps (Thiothixene) .... One Cap By Mouth Qhs 6)  Omeprazole 20 Mg Tbec (Omeprazole) .... One By Mouth Daily 30 Min Before Breakfast 7)  Miralax  Powd (Polyethylene Glycol 3350) .Marland KitchenMarland KitchenMarland Kitchen 17 Grams By Mouth Daily As Needed For Constipation 8)  Potassium Chloride 20 Meq/54ml (10%) Liqd (Potassium Chloride) .... 30 Cc Two Times A Day 9)  Dicyclomine Hcl 10 Mg Caps (Dicyclomine Hcl) .... Take 1 Capsule By Mouth Two Times A Day  As Needed 10)  Fluoxetine Hcl 10 Mg Caps (Fluoxetine Hcl) .... One Cap By Mouth Qd 11)  Vesicare 5 Mg Tabs (Solifenacin Succinate) .Marland Kitchen.. 1 Tab Once Daily  Allergies (verified): 1)  ! Iodine 2)  ! Depakote 3)  ! Codeine 4)  ! Lithium Carbonate (Lithium Carbonate) 5)  Pcn  Review of Systems      See HPI GI:  Complains of vomiting; 3 day history. MS:  Complains of joint pain, low back pain, and stiffness; low back pain and bilateral knee pains. Derm:  Complains of lesion(s); moles  x 3 in scalp seem to be getting bigger there  all her life. Endo:  Denies cold intolerance, excessive hunger, excessive thirst, excessive urination, heat intolerance, polyuria, and weight change. Heme:  Denies abnormal bruising and bleeding. Allergy:  Complains of seasonal allergies.  Physical Exam  General:  Well-developed,obese,in no acute distress; alert,appropriate and cooperative throughout examination HEENT: No facial asymmetry,  EOMI, no sinus tenderness, TM's Clear, oropharynx  pink and moist. poor  dentition  Chest: decreased air entry, scattered  wheezes CVS: S1, S2, No murmurs, No S3.   Abd: Soft, Nontender.  MS: decreased  ROM  spine and  hips,adequate in shoulders and knees.  Ext: No edema.   CNS: CN 2-12 intact, power tone and sensation normal throughout.   Skin: Intact, multiple nevi on scxalp (3)  Psych: Good eye contact, normal affect.  Memory intact, not anxious or depressed appearing. '   Impression & Recommendations:  Problem # 1:  NEVI, MULTIPLE (ICD-216.9) Assessment Comment Only  Future Orders: Dermatology Referral (Derma) ... 05/25/2009  Problem # 2:  BACK PAIN, CHRONIC (ICD-724.5) Assessment: Deteriorated toradol administered  Problem # 3:  NAUSEA WITH VOMITING (ICD-787.01) Assessment: Comment Only  Orders: Zofran 1mg . injection (E4540) Admin of Therapeutic Inj  intramuscular or subcutaneous (98119)  Problem # 4:  COPD (ICD-496) Assessment: Deteriorated  Her updated medication list for this problem includes:    Spiriva Handihaler 18 Mcg Caps (Tiotropium bromide monohydrate) ..... One inhalation daily  Problem # 5:  OBESITY, UNSPECIFIED (ICD-278.00) Assessment: Unchanged  Ht: 63 (05/24/2009)   Wt: 172 (05/24/2009)   BMI: 30.58 (05/24/2009)  Problem # 6:  NICOTINE ADDICTION (ICD-305.1) Assessment: Unchanged  Encouraged smoking cessation and discussed different methods for smoking cessation.   Problem # 7:  ESSENTIAL HYPERTENSION (ICD-401.9) Assessment: Unchanged  Her updated medication list for this problem includes:    Maxzide-25 37.5-25 Mg Tabs (Triamterene-hctz) .Marland Kitchen... Take one and a half tablet daily  BP today: 120/80 Prior BP: 124/88 (04/20/2009)  Labs Reviewed: K+: 3.4 (04/11/2009) Creat: : 0.72 (03/28/2009)   Chol: 191 (12/16/2008)   HDL: 39 (12/16/2008)   LDL: 110 (12/16/2008)   TG: 209 (12/16/2008)  Complete Medication List: 1)  Lamictal 150 Mg Tabs (Lamotrigine) .... Take 1 tablet by mouth two times a day 2)  Alprazolam 1 Mg Tabs (Alprazolam) .... Take one tab in the am and two tabs the pm 3)  Maxzide-25 37.5-25 Mg Tabs (Triamterene-hctz) .... Take one and a  half tablet daily 4)  Simvastatin 20 Mg Tabs (Simvastatin) .... One tab by mouth qhs 5)  Thiothixene 2 Mg Caps (Thiothixene) .... One cap by mouth qhs 6)  Omeprazole 20 Mg Tbec (Omeprazole) .... One by mouth daily 30 min before breakfast 7)  Miralax Powd (Polyethylene glycol 3350) .Marland KitchenMarland KitchenMarland Kitchen 17 grams by mouth daily as needed for constipation 8)  Potassium Chloride 20 Meq/63ml (10%) Liqd (Potassium chloride) .... 30 cc two times a day 9)  Dicyclomine Hcl 10 Mg Caps (Dicyclomine hcl) .... Take 1 capsule by mouth two times a day  as needed 10)  Fluoxetine Hcl 10 Mg Caps (Fluoxetine hcl) .... One cap by mouth qd 11)  Vesicare 5 Mg Tabs (Solifenacin succinate) .Marland Kitchen.. 1 tab once daily 12)  Spiriva Handihaler 18 Mcg Caps (Tiotropium bromide monohydrate) .... One inhalation daily  Other Orders: T-Potassium  ([id number]) Ketorolac-Toradol 15mg  [id number])  Patient Instructions: 1)  Please schedule a follow-up appointment in 3 months. 2)  Tobacco is very bad for your health and your loved ones! You Should stop smoking!. 3)  Stop Smoking Tips: Choose a  Quit date. Cut down before the Quit date. decide what you will do as a substitute when you feel the urge to smoke(gum,toothpick,exercise). 4)  It is important that you exercise regularly at least 20 minutes 5 times a week. If you develop chest pain, have severe difficulty breathing, or feel very tired , stop exercising immediately and seek medical attention. 5)  You need to lose weight. Consider a lower calorie diet and regular exercise.  6)  you will start  an inhaler  to help with your breathing. 7)  you will be refered to dermatology about the moles. 8)    Prescriptions: DICYCLOMINE HCL 10 MG CAPS (DICYCLOMINE HCL) Take 1 capsule by mouth two times a day  as needed  #60 x 4   Entered by:   Everitt Amber   Authorized by:   Syliva Overman MD   Signed by:   Everitt Amber on 05/24/2009   Method used:   Electronically to        [facility] [address] [phone]*  (retail)       [address] [place], [place]  [address]       Ph: [phone]       Fax: [phone]   RxID:   [id number] SPIRIVA HANDIHALER 18 MCG CAPS (TIOTROPIUM BROMIDE MONOHYDRATE) one inhalation daily  #1 x 3   Entered and Authorized by:   Syliva Overman MD   Signed by:   Syliva Overman MD on 05/24/2009   Method used:   Electronically to        [facility] [address] [phone]* (retail)       [address] [place], [place]  [address]       Ph: [phone]       Fax: [phone]   RxID:   [id number]    Medication Administration  Injection # 1:    Medication: Ketorolac-Toradol 15mg     Diagnosis: BACK PAIN, CHRONIC (ICD-724.5)    Route: IM    Site: RUOQ gluteus    Exp Date: 9/12    Lot #: [id number]    Mfr: [email or web address]    Comments: 60 mg given     Patient tolerated injection without complications    Given by: Everitt Amber (May 24, 2009 4:40 PM)  Injection # 2:    Medication: Zofran 1mg . injection    Diagnosis: NAUSEA WITH VOMITING (ICD-787.01)    Route: IM    Site: RUOQ gluteus    Exp Date: 09/2009    Lot #: [id number]    Mfr: [email or web address]    Comments: 4 mg given     Patient tolerated injection without complications    Given by: Everitt Amber (May 24, 2009 4:41 PM)  Orders Added: 1)  Est. Patient Level IV [43154] 2)  T-Potassium  [[id number]] 3)  Ketorolac-Toradol 15mg  [J1885] 4)  Zofran 1mg . injection [J2405] 5)  Admin of Therapeutic Inj  intramuscular or subcutaneous [96372] 6)  Dermatology Referral [Derma]

## 2010-05-23 NOTE — Progress Notes (Signed)
Summary: WANTS YOU TO CALL  Phone Note Call from Patient   Summary of Call: WANTS TO SPEAK WITH YOU NOT THE NURSES  CELL # [phone] Initial call taken by: Lind Guest,  March 23, 2009 2:30 PM  Follow-up for Phone Call        advised pt stop flexeril, I will try dicyclomine for abd muscle spasm, rx sent in Follow-up by: Syliva Overman MD,  March 23, 2009 6:35 PM    New/Updated Medications: DICYCLOMINE HCL 10 MG CAPS (DICYCLOMINE HCL) Take 1 capsule by mouth two times a day  as needed Prescriptions: DICYCLOMINE HCL 10 MG CAPS (DICYCLOMINE HCL) Take 1 capsule by mouth two times a day  as needed  #60 x 2   Entered and Authorized by:   Syliva Overman MD   Signed by:   Syliva Overman MD on 03/23/2009   Method used:   Electronically to        [facility] [address] [phone]* (retail)       [address], [place]  [address]       Ph: [phone]       Fax: [phone]   RxID:   [id number]

## 2010-05-23 NOTE — Assessment & Plan Note (Signed)
Summary: KNOT ON HAIRLINE   Vital Signs:  Patient profile:   40 year old female Menstrual status:  hysterectomy Height:      63 inches Weight:      150.75 pounds BMI:     26.80 O2 Sat:      97 % Pulse rate:   83 / minute Resp:     16 per minute BP sitting:   124 / 90  (left arm) Cuff size:   regular  Vitals Entered By: Everitt Amber LPN CC: Follow up, has a knot on the back of her neck at hairline that hurts   Referring Lyrical Sowle:  Dr. Judie Petit. [facility] Primary Jawanna Dykman:  Dr. Berenice Primas  CC:  Follow up and has a knot on the back of her neck at hairline that hurts.  History of Present Illness: Pt states she noticed a tender lump on the back of her neck by her hairline this past week.  No change in size.  No fever or chills.  No recent hair coloring or other treatments.    Also has been getting painsin her legs and cramps in her toes.  Has a hx of chronic back pain. Requests shots of Toradol and Depo Medrol today becuase these help her.  Hx of htn. Taking medications as prescribed, including potassium,  and denies side effects.  No headache, chest pain or palpitations.     Current Medications (verified): 1)  Lamictal 150 Mg Tabs (Lamotrigine) .... Take 1 Tablet By Mouth Two Times A Day 2)  Diazepam 5 Mg Tabs (Diazepam) .... Take 1 Tablet By Mouth Three Times A Day 3)  Maxzide-25 37.5-25 Mg Tabs (Triamterene-Hctz) .... Take One and A Half Tablet Daily 4)  Simvastatin 20 Mg Tabs (Simvastatin) .... One Tab By Mouth Qhs 5)  Miralax  Powd (Polyethylene Glycol 3350) .Marland KitchenMarland KitchenMarland Kitchen 17 Grams By Mouth Daily As Needed For Constipation 6)  Potassium Chloride 20 Meq/70ml (10%) Liqd (Potassium Chloride) .... 30 Cc Two Times A Day 7)  Spiriva Handihaler 18 Mcg Caps (Tiotropium Bromide Monohydrate) .... One Inhalation Daily 8)  Prozac 20 Mg Caps (Fluoxetine Hcl) .... One Cap By Mouth Once Daily 9)  Thiothixene 10 Mg Caps (Thiothixene) .... One Cap By Mouth Qhs 10)  Methocarbamol 500 Mg Tabs (Methocarbamol)  .... Take 2 Tabs Every 6 Hrs As Needed For Muscle Spasm 11)  Doxepin Hcl 10 Mg Caps (Doxepin Hcl) .... Take 1 Tab By Mouth At Bedtime 12)  Carbamazepine 200 Mg Tabs (Carbamazepine) .... Take 1 Tablet By Mouth Two Times A Day  Allergies (verified): 1)  ! Iodine 2)  ! Depakote 3)  ! Codeine 4)  ! Lithium Carbonate (Lithium Carbonate) 5)  Pcn  Past History:  Past medical history reviewed for relevance to current acute and chronic problems.  Past Medical History: Reviewed history from 02/11/2009 and no changes required. Hypertension  Arthritis Depression/Anxiety, HOSPITALISED FOR MENTAL HEALTH PROBS  2007, Dr Thomasena Edis follows her Seizures Migraines Chronic back pain Hx of LEUKOCYTOSIS (ICD-288.60) Hx of SEIZURE DISORDER (ICD-780.39) BIPOLAR AFFECTIVE DISORDER (ICD-296.80) OSTEOARTHRITIS, MILD (ICD-715.90) ABDOMINAL PAIN, LEFT LOWER QUADRANT (ICD-789.04) CONSTIPATION, CHRONIC (ICD-564.09) NAUSEA (ICD-787.02) NICOTINE ADDICTION (ICD-305.1) OBESITY, UNSPECIFIED (ICD-278.00) FATIGUE (ICD-780.79) KNEE PAIN, RIGHT, ACUTE (ICD-719.46) HYPERLIPIDEMIA (ICD-272.4) HYPOKALEMIA (ICD-276.8) COPD  Review of Systems General:  Denies chills and fever. ENT:  Denies earache, nasal congestion, and sinus pressure. CV:  Denies chest pain or discomfort and palpitations. Resp:  Denies cough. MS:  Complains of cramps; denies low back pain. Derm:  Denies lesion(s)  and rash.  Physical Exam  General:  Well-developed,well-nourished,in no acute distress; alert,appropriate and cooperative throughout examination Head:  Normocephalic and atraumatic without obvious abnormalities. No apparent alopecia or balding. Ears:  External ear exam shows no significant lesions or deformities.  Otoscopic examination reveals clear canals, tympanic membranes are intact bilaterally without bulging, retraction, inflammation or discharge. Hearing is grossly normal bilaterally. Nose:  External nasal examination shows no  deformity or inflammation. Nasal mucosa are pink and moist without lesions or exudates. Mouth:  Oral mucosa and oropharynx without lesions or exudates. Neck:  No deformities, masses, or tenderness noted. Lungs:  Normal respiratory effort, chest expands symmetrically. Lungs are clear to auscultation, no crackles or wheezes. Heart:  Normal rate and regular rhythm. S1 and S2 normal without gallop, murmur, click, rub or other extra sounds. Cervical Nodes:  No lymphadenopathy noted. one shotty, approx 4-5 mm Lt occipital node noted, this is smooth and mobile, and mildly TTP Psych:  Cognition and judgment appear intact. Alert and cooperative with normal attention span and concentration. No apparent delusions, illusions, hallucinations   Impression & Recommendations:  Problem # 1:  LYMPHADENOPATHY (ICD-785.6) Reassurance.  Orders: T-CBC No Diff ([id number])  Problem # 2:  ESSENTIAL HYPERTENSION (ICD-401.9) Assessment: Comment Only  Her updated medication list for this problem includes:    Maxzide-25 37.5-25 Mg Tabs (Triamterene-hctz) .Marland Kitchen... Take one and a half tablet daily  Orders: T-Basic Metabolic Panel [id number])  BP today: 124/90 Prior BP: 120/82 (09/12/2009)  Labs Reviewed: K+: 3.8 (07/15/2009) Creat: : 0.68 (07/15/2009)   Chol: 191 (12/16/2008)   HDL: 39 (12/16/2008)   LDL: 110 (12/16/2008)   TG: 209 (12/16/2008)  Problem # 3:  BACK PAIN, CHRONIC (ICD-724.5) Assessment: Comment Only  Her updated medication list for this problem includes:    Methocarbamol 500 Mg Tabs (Methocarbamol) .Marland Kitchen... Take 2 tabs every 6 hrs as needed for muscle spasm  Orders: Depo- Medrol 80mg  (J1040) Ketorolac-Toradol 15mg  (G9562) Admin of Therapeutic Inj  intramuscular or subcutaneous (13086)  Complete Medication List: 1)  Lamictal 150 Mg Tabs (Lamotrigine) .... Take 1 tablet by mouth two times a day 2)  Diazepam 5 Mg Tabs (Diazepam) .... Take 1 tablet by mouth three times a day 3)  Maxzide-25  37.5-25 Mg Tabs (Triamterene-hctz) .... Take one and a half tablet daily 4)  Simvastatin 20 Mg Tabs (Simvastatin) .... One tab by mouth qhs 5)  Miralax Powd (Polyethylene glycol 3350) .Marland KitchenMarland KitchenMarland Kitchen 17 grams by mouth daily as needed for constipation 6)  Potassium Chloride 20 Meq/38ml (10%) Liqd (Potassium chloride) .... 30 cc two times a day 7)  Spiriva Handihaler 18 Mcg Caps (Tiotropium bromide monohydrate) .... One inhalation daily 8)  Prozac 20 Mg Caps (Fluoxetine hcl) .... One cap by mouth once daily 9)  Thiothixene 10 Mg Caps (Thiothixene) .... One cap by mouth qhs 10)  Methocarbamol 500 Mg Tabs (Methocarbamol) .... Take 2 tabs every 6 hrs as needed for muscle spasm 11)  Doxepin Hcl 10 Mg Caps (Doxepin hcl) .... Take 1 tab by mouth at bedtime 12)  Carbamazepine 200 Mg Tabs (Carbamazepine) .... Take 1 tablet by mouth two times a day  Patient Instructions: 1)  Please schedule a follow-up appointment in 1 month with Dr Lodema Hong 2)  I have ordered blood work. 3)  The lump on the back of your neck is a lymph node. Keep an eye on this. If  you notice that it is getting larger you will need to have it rechecked.   Medication Administration  Injection # 1:    Medication: Depo- Medrol 80mg     Diagnosis: BACK PAIN, CHRONIC (ICD-724.5)    Route: IM    Site: RUOQ gluteus    Exp Date: 5/12    Lot #: [id number]    Mfr: Pharmacia    Patient tolerated injection without complications    Given by: Adella Hare LPN (January 19, 2010 2:44 PM)  Injection # 2:    Medication: Ketorolac-Toradol 15mg     Diagnosis: BACK PAIN, CHRONIC (ICD-724.5)    Route: IM    Site: LUOQ gluteus    Exp Date: 06/22/2011    Lot #: [id number]    Mfr: [email or web address]    Comments: toradol 60mg  given    Patient tolerated injection without complications    Given by: Adella Hare LPN (January 19, 2010 2:45 PM)  Orders Added: 1)  T-Basic Metabolic Panel [[id number]] 2)  T-CBC No Diff [[id number]] 3)  Depo- Medrol 80mg  [J1040] 4)   Ketorolac-Toradol 15mg  [J1885] 5)  Admin of Therapeutic Inj  intramuscular or subcutaneous [96372] 6)  Est. Patient Level III [84696]

## 2010-05-23 NOTE — Miscellaneous (Signed)
Summary: Orders Update  Clinical Lists Changes  Orders: Added new Test order of T-Potassium  ([id number]) - Signed  

## 2010-05-23 NOTE — Progress Notes (Signed)
Summary: lab order  Phone Note Call from Patient   Summary of Call: forgot to tell you that she had been bitten by a tick when she was in that place she stayed at  they gave her an ant and she wants to know can you do lab test and see if all the infection is gone  call back at [phone] Initial call taken by: Lind Guest,  Sep 01, 2009 11:02 AM  Follow-up for Phone Call        She states the tick did carry an infection but it wasn't Lyme disease. She wants to be checked to make sure she doesn't have anything from the tick. I told her to finsh her antibiotics first and I would ask what test, if any, she needs to have. She has already had [place] spotted fever and said she can't get that again.  What do I need to tell her? Follow-up by: Everitt Amber LPN,  Sep 01, 2009 11:27 AM  Additional Follow-up for Phone Call Additional follow up Details #1::        I don't know what it would be if not Lyme Dis or [place] Spotted Fever.  Can she check with whoever told her it was something else to see what it might have been? If she completes the antibiotic, and has no fever, body aches, or rash probably doesn't need lab work. Additional Follow-up by: Esperanza Sheets PA,  Sep 01, 2009 2:51 PM    Additional Follow-up for Phone Call Additional follow up Details #2::    Patient aware Follow-up by: Everitt Amber LPN,  Sep 01, 2009 4:08 PM

## 2010-05-23 NOTE — Progress Notes (Signed)
Summary: test results  Phone Note Call from Patient   Summary of Call: wants her test results call her back at [phone] Initial call taken by: Lind Guest,  January 31, 2009 1:13 PM  Follow-up for Phone Call        patient states she hurt all weekend, buring with urniation. no infection in sample provided thurs only blood, sent for culture, not back yet. patient is experiencing discomfort  Follow-up by: Worthy Keeler LPN,  January 31, 2009 1:55 PM  Additional Follow-up for Phone Call Additional follow up Details #1::        advise and erx pyridium 200mg  Take 1 tablet by mouth three times a day #6 0nly, let her know the culture is still pending, pls check with lab on this, an i suggest a urology eval for dysuria, if I recall he last cCUA was negative, she may have interstitial cystitis, let me know if she wantsreferral Additional Follow-up by: Syliva Overman MD,  January 31, 2009 5:40 PM    Additional Follow-up for Phone Call Additional follow up Details #2::    called [facility], they state they did not recieve a urine on this patient, even though i clearly remember doing this and the orders are in the computer. patient to resubmit urine. rx sent. patient does want urology referal Follow-up by: Worthy Keeler LPN,  February 01, 2009 9:33 AM  Additional Follow-up for Phone Call Additional follow up Details #3:: Details for Additional Follow-up Action Taken: let pt know about thespecimen, ask her to go directly tothelab to resubmit and let her knowI will be referring her toa urologist for eval, should get call about appt from thisoffice within 1 week, call referralsif not Additional Follow-up by: Syliva Overman MD,  February 01, 2009 12:27 PM  New/Updated Medications: PYRIDIUM 200 MG TABS (PHENAZOPYRIDINE HCL) one tab by mouth three times a day Prescriptions: PYRIDIUM 200 MG TABS (PHENAZOPYRIDINE HCL) one tab by mouth three times a day  #6 x 0   Entered by:   Worthy Keeler LPN  Authorized by:   Syliva Overman MD   Signed by:   Worthy Keeler LPN on 16/01/9603   Method used:   Electronically to        [facility] [address] [phone]* (retail)       [address], [place]  [address]       Ph: [phone]       Fax: [phone]   RxID:   [id number]  Referral staff, pls refer pt to Dr. Rito Ehrlich or Jerre Simon to eval for possible interstitial cystitis and hematuria pt has appt at dr. Bari Mantis office for 02/21/2009  4:00. pt notified Rudene Anda  February 02, 2009 2:37 PM

## 2010-05-23 NOTE — Progress Notes (Signed)
Summary: CALL HER BACK ON HER MEDICINE  Phone Note Call from Patient   Summary of Call: NEEDS YOU TO CALL HER AT [phone] OR   [phone] Initial call taken by: Lind Guest,  March 22, 2009 10:33 AM  Follow-up for Phone Call        The Hycosamine that you gave her, Medicaid won't pay for and she needs it changed to something else. Called the pharmacist and she said that she doesn't know what would be covered because the rejection usually gives alternatives but it didn't this time. Follow-up by: Everitt Amber,  March 22, 2009 11:30 AM  Additional Follow-up for Phone Call Additional follow up Details #1::        pls call in levbid 0.375mg  one twice daily #60 refill 3, let pt know we are trying to see ifins will cover, I will put in ov when I do it Additional Follow-up by: Syliva Overman MD,  March 22, 2009 4:56 PM    Additional Follow-up for Phone Call Additional follow up Details #2::    patient aware Follow-up by: Worthy Keeler LPN,  March 22, 2009 5:00 PM

## 2010-05-23 NOTE — Assessment & Plan Note (Signed)
Summary: shot  Nurse Visit   Vital Signs:  Patient profile:   40 year old female Menstrual status:  hysterectomy Height:      63 inches O2 Sat:      97 % Pulse rate:   80 / minute Resp:     16 per minute BP sitting:   120 / 90 Cuff size:   regular  Vitals Entered By: Everitt Amber (March 16, 2009 1:04 PM) Comments Patient in complaining of severe stomach pain and recieved a shot of Toradol 60 for pain and she was sent to the ER   Allergies: 1)  ! Iodine 2)  ! Depakote 3)  ! Codeine 4)  ! Lithium Carbonate (Lithium Carbonate) 5)  Pcn  Medication Administration  Injection # 1:    Medication: Ketorolac-Toradol 15mg     Diagnosis: ABDOMINAL PAIN (ICD-789.00)    Route: IM    Site: RUOQ gluteus    Exp Date: 11/2010    Lot #: [id number]    Mfr: [email or web address]    Comments: 60 mg given    Patient tolerated injection without complications    Given by: Everitt Amber (March 16, 2009 1:06 PM)  Orders Added: 1)  Ketorolac-Toradol 15mg  [J1885] 2)  Admin of Therapeutic Inj  intramuscular or subcutaneous [96372] pt received toradol per my order for pain, she has been complaining of pain ever since a hida scan last week in her abdomen, Nursing called me to see the pt since she seemedto be in alot of pain, on exam the patient had significant superficial right lower abdominal tenderness, I advised that she go to the ED for further eval, and discussed the case with him  Medication Administration  Injection # 1:    Medication: Ketorolac-Toradol 15mg     Diagnosis: ABDOMINAL PAIN (ICD-789.00)    Route: IM    Site: RUOQ gluteus    Exp Date: 11/2010    Lot #: [id number]    Mfr: [email or web address]    Comments: 60 mg given    Patient tolerated injection without complications    Given by: Everitt Amber (March 16, 2009 1:06 PM)  Orders Added: 1)  Ketorolac-Toradol 15mg  [J1885] 2)  Admin of Therapeutic Inj  intramuscular or subcutaneous [08657]

## 2010-05-23 NOTE — Progress Notes (Signed)
Summary: CALL  Phone Note Call from Patient   Summary of Call: WANTS TO KNOW IF SHE CAN HAVE A ORDER FOR LAB FOR POTAS. PAIN IN RIGHT SIDE  FEELS HER POTAS. IS LOW CALL BACK AT [phone] Initial call taken by: Lind Guest,  January 05, 2009 8:24 AM  Follow-up for Phone Call        pls order chem 7, dx hTN and low potassium,  sTAT  before 1pm today Follow-up by: Syliva Overman MD,  January 05, 2009 10:38 AM  Additional Follow-up for Phone Call Additional follow up Details #1::        Phone Call Completed, order sent Additional Follow-up by: Worthy Keeler LPN,  January 05, 2009 10:46 AM

## 2010-05-23 NOTE — Letter (Signed)
Summary: Generic Letter, Intro to Referring  [facility]  [address]   [place], [place] [address]   Phone: [phone]  Fax: [phone]      January 20, 2009             RE: Brooke Maddox   06/25/1970                 [address] [place]  [address]  Dear Appt Secretary,  This pt has been scheduled an appt for 02/11/2009 @ 9:30 w/ Lorenza Burton, NP.   Pt is aware of appt.           Sincerely,    Elinor Parkinson  [facility] Ph: [phone]   Fax: [phone]

## 2010-05-23 NOTE — Progress Notes (Signed)
  Phone Note Call from Patient   Summary of Call: Brooke Maddox called and said the pain has gotten worse and when she called the hospital they stated that they only gave her what Dr. Lodema Hong ordered and she should know what she ordered. They did tell her that the hormones shouldn't still be causing her pain. She is stating that she is having severe pain on both sides of lower abdomen and its similar to contraction pains. She has been calling for 3 days ahe said and nobody will call her back and she wants somethng for the pain now because she cannot go on like that. Wants me to call her right back Initial call taken by: Everitt Amber,  March 16, 2009 9:46 AM  Follow-up for Phone Call        pl;s letAmy  COME IN FORA TORADOL 60MG  Im INJECTION ASAP Follow-up by: Syliva Overman MD,  March 16, 2009 10:00 AM  Additional Follow-up for Phone Call Additional follow up Details #1::        Patient aware Additional Follow-up by: Everitt Amber,  March 16, 2009 10:32 AM

## 2010-05-23 NOTE — Assessment & Plan Note (Signed)
Summary: FOLLOW FROM HOS   Vital Signs:  Patient profile:   40 year old female Menstrual status:  hysterectomy Height:      63 inches Weight:      170.25 pounds BMI:     30.27 Pulse rate:   87 / minute Pulse rhythm:   regular Resp:     16 per minute BP sitting:   110 / 80  (left arm)  Vitals Entered By: Worthy Keeler LPN (January 12, 2009 10:37 AM)  Nutrition Counseling: Patient's BMI is greater than 25 and therefore counseled on weight management options. CC: hospital follow up  Is Patient Diabetic? No Pain Assessment Patient in pain? yes     Location: stomach Intensity: 8 Type: aching Onset of pain  Intermittent   CC:  hospital follow up .  History of Present Illness: Pt hospitalised for 2 days with acute vomitting one week ago, she is still nauseaous, and is c/o headache today.States her symptoms started shortly after being started on lithium, this has been stopped Sent home on phenergan and ranitidine No fever or chills.  Two small bowel movements daily with mucus .   Current Medications (verified): 1)  Lamictal 150 Mg Tabs (Lamotrigine) .... Take 1 Tablet By Mouth Two Times A Day 2)  Alprazolam 1 Mg Tabs (Alprazolam) .... Take One Tab in The Am and Two Tabs The Pm 3)  Flexeril 10 Mg Tabs (Cyclobenzaprine Hcl) .... Take 1 Tab By Mouth At Bedtime 4)  Maxzide-25 37.5-25 Mg Tabs (Triamterene-Hctz) .... Take 1 Tablet By Mouth Once A Day 5)  Simvastatin 20 Mg Tabs (Simvastatin) .... One Tab By Mouth Qhs 6)  Klor-Con M20 20 Meq Cr-Tabs (Potassium Chloride Crys Cr) .... Take 1 Tablet By Mouth Once A Day 7)  Promethazine Hcl 25 Mg Tabs (Promethazine Hcl) .... One Tab By Mouth Two Times A Day As Needed For Nausea 8)  Ranitidine Hcl 150 Mg Tabs (Ranitidine Hcl) .... One Tab By Mouth Bid  Allergies (verified): 1)  ! Iodine 2)  ! Depakote 3)  ! Codeine 4)  Pcn  Review of Systems      See HPI ENT:  Denies hoarseness, nasal congestion, sinus pressure, and sore  throat. CV:  Denies chest pain or discomfort, palpitations, and swelling of feet. Resp:  Denies coughing up blood, sputum productive, and wheezing. GU:  Denies dysuria and urinary frequency. Derm:  Denies itching and rash. Psych:  Complains of anxiety and depression. Heme:  Denies abnormal bruising and bleeding. Allergy:  Denies hives or rash and itching eyes.  Physical Exam  General:  alert, well-hydrated, and overweight-appearing.  HEENT: No facial asymmetry,  EOMI, No sinus tenderness, TM's Clear, oropharynx  pink and moist.   Chest: Clear to auscultation bilaterally.  CVS: S1, S2, No murmurs, No S3.   Abd: Soft, Nontender.  MS: decreased ROM spine,  and knee.Adequate in hips and shoulders.  Ext: No edema.   CNS: CN 2-12 intact, power tone and sensation normal throughout.   Skin: Intact, no visible lesions or rashes.  Psych: Good eye contact, normal affect.  Memory intact, not anxious or depressed appearing.     Impression & Recommendations:  Problem # 1:  NAUSEA (ICD-787.02) Assessment Deteriorated  Orders: Zofran 1mg . injection (A5409)  Problem # 2:  HEADACHE (ICD-784.0) Assessment: Deteriorated  The following medications were removed from the medication list:    Mobic 15 Mg Tabs (Meloxicam) .Marland Kitchen... Take 1 tablet by mouth once a day  Orders: Ketorolac-Toradol 15mg  (W1191)  Admin of Therapeutic Inj  intramuscular or subcutaneous (78295)  Problem # 3:  NICOTINE ADDICTION (ICD-305.1) Assessment: Improved  Encouraged smoking cessation and discussed different methods for smoking cessation.   Problem # 4:  OBESITY, UNSPECIFIED (ICD-278.00) Assessment: Unchanged  Ht: 63 (01/12/2009)   Wt: 170.25 (01/12/2009)   BMI: 30.27 (01/12/2009)  Problem # 5:  ESSENTIAL HYPERTENSION (ICD-401.9) Assessment: Unchanged  Her updated medication list for this problem includes:    Maxzide-25 37.5-25 Mg Tabs (Triamterene-hctz) .Marland Kitchen... Take 1 tablet by mouth once a day  BP today:  110/80 Prior BP: 110/80 (12/16/2008)  Labs Reviewed: K+: 3.5 (01/05/2009) Creat: : 0.94 (01/05/2009)   Chol: 191 (12/16/2008)   HDL: 39 (12/16/2008)   LDL: 110 (12/16/2008)   TG: 209 (12/16/2008)  Complete Medication List: 1)  Lamictal 150 Mg Tabs (Lamotrigine) .... Take 1 tablet by mouth two times a day 2)  Alprazolam 1 Mg Tabs (Alprazolam) .... Take one tab in the am and two tabs the pm 3)  Flexeril 10 Mg Tabs (Cyclobenzaprine hcl) .... Take 1 tab by mouth at bedtime 4)  Maxzide-25 37.5-25 Mg Tabs (Triamterene-hctz) .... Take 1 tablet by mouth once a day 5)  Simvastatin 20 Mg Tabs (Simvastatin) .... One tab by mouth qhs 6)  Klor-con M20 20 Meq Cr-tabs (Potassium chloride crys cr) .... Take 1 tablet by mouth once a day 7)  Promethazine Hcl 25 Mg Tabs (Promethazine hcl) .... One tab by mouth two times a day as needed for nausea 8)  Ranitidine Hcl 150 Mg Tabs (Ranitidine hcl) .... One tab by mouth bid 9)  Promethazine Hcl 25 Mg Tabs (Promethazine hcl) .... Take 1 tablet by mouth three times a day as needed  Other Orders: T-Basic Metabolic Panel [phone]) T-Hepatic Function [phone]) T-Lipid Profile [phone])  Patient Instructions: 1)  F/U end November. 2)  BMP prior to visit, ICD-9: 3)  Hepatic Panel prior to visit, ICD-9  fasting end November. 4)  Lipid Panel prior to visit, ICD-9: 5)  It is important that you exercise regularly at least 20 minutes 5 times a week. If you develop chest pain, have severe difficulty breathing, or feel very tired , stop exercising immediately and seek medical attention. 6)  You need to lose weight. Consider a lower calorie diet and regular exercise.   Medication Administration  Injection # 1:    Medication: Ketorolac-Toradol 15mg     Diagnosis: HEADACHE (ICD-784.0)    Route: IM    Site: RUOQ gluteus    Exp Date: 08/22/2010    Lot #: [id number]    Mfr: [email or web address]    Comments: toradol 60mg  given    Patient tolerated injection without  complications    Given by: Worthy Keeler LPN (January 12, 2009 12:16 PM)  Injection # 2:    Medication: Zofran 1mg . injection    Diagnosis: NAUSEA (ICD-787.02)    Route: IM    Site: LUOQ gluteus    Exp Date: 7/12    Lot #: [id number]    Mfr: [email or web address]    Comments: zofran 4mg  given    Patient tolerated injection without complications    Given by: Worthy Keeler LPN (January 12, 2009 12:16 PM)  Orders Added: 1)  Est. Patient Level IV [59563] 2)  T-Basic Metabolic Panel [[id number]] 3)  T-Hepatic Function [80076-22960] 4)  T-Lipid Profile [80061-22930] 5)  Ketorolac-Toradol 15mg  [J1885] 6)  Zofran 1mg . injection [J2405] 7)  Admin of Therapeutic Inj  intramuscular or subcutaneous [18841]

## 2010-05-23 NOTE — Assessment & Plan Note (Signed)
Summary: OV   Vital Signs:  Patient profile:   40 year old female Menstrual status:  hysterectomy Height:      63 inches Weight:      149.50 pounds BMI:     26.58 O2 Sat:      98 % on Room air Pulse rate:   78 / minute Pulse rhythm:   regular Resp:     16 per minute BP sitting:   110 / 82  (right arm)  Vitals Entered By: Adella Hare LPN (March 15, 2010 9:23 AM)  Nutrition Counseling: Patient's BMI is greater than 25 and therefore counseled on weight management options.  O2 Flow:  Room air CC: follow-up visit Is Patient Diabetic? No   Primary Care Teva Bronkema:  Dr. Berenice Primas  CC:  follow-up visit.  History of Present Illness: Reports  that she has been doing fairly well. Denies recent fever or chills. Denies sinus pressure, nasal congestion , ear pain or sore throat. Denies chest congestion, or cough productive of sputum. Denies chest pain, palpitations, PND, orthopnea or leg swelling. Denies abdominal pain, nausea, vomitting, diarrhea or constipation. Denies change in bowel movements or bloody stool. Denies dysuria , frequency, incontinence or hesitancy.  Denies headaches, vertigo, seizures. Continues to smoke, no quit date set       Preventive Screening-Counseling & Management  Alcohol-Tobacco     Smoking Cessation Counseling: yes  Allergies (verified): 1)  ! Iodine 2)  ! Depakote 3)  ! Codeine 4)  ! Lithium Carbonate (Lithium Carbonate) 5)  Pcn  Review of Systems      See HPI General:  Complains of fatigue. Eyes:  Denies discharge and eye pain. MS:  Complains of low back pain; increased back pain in the past 2 weeks , requestiong shots. Derm:  Complains of lesion(s); multiple skin tags even in sclp, wants them removed, also painful occipital nodes x 2 months. Psych:  Complains of anxiety, depression, and mental problems; denies suicidal thoughts/plans, thoughts of violence, and unusual visions or sounds. Endo:  Denies cold intolerance, excessive  hunger, excessive thirst, and excessive urination. Heme:  Denies abnormal bruising and bleeding. Allergy:  Complains of seasonal allergies; denies hives or rash and itching eyes.  Physical Exam  General:  Well-developed,well-nourished,in no acute distress; alert,appropriate and cooperative throughout examination HEENT: No facial asymmetry,  EOMI, No sinus tenderness, TM's Clear, oropharynx  pink and moist.   Chest: Clear to auscultation bilaterally.Reduced air entry bilaterally  CVS: S1, S2, No murmurs, No S3.   Abd: Soft, Nontender.obese  MS: decreased ROM thoracolmbar spine, hips, shoulders and knees.  Ext: No edema.   CNS: CN 2-12 intact, power tone and sensation normal throughout.   Skin: Intact,multiple skin tags Psych: Good eye contact, normal affect.  Memory intact, not anxious or depressed appearing.    Impression & Recommendations:  Problem # 1:  SKIN TAG (ICD-701.9) Assessment Comment Only  derm referral  Orders: Dermatology Referral (Derma)  Problem # 2:  COPD (ICD-496) Assessment: Deteriorated  Her updated medication list for this problem includes:    Spiriva Handihaler 18 Mcg Caps (Tiotropium bromide monohydrate) ..... One inhalation daily  Pulmonary Functions Reviewed: O2 sat: 98 (03/15/2010)     Vaccines Reviewed: Flu Vax: Historical (12/30/2008)  Problem # 3:  BACK PAIN, CHRONIC (ICD-724.5) Assessment: Deteriorated  Her updated medication list for this problem includes:    Methocarbamol 500 Mg Tabs (Methocarbamol) .Marland Kitchen... Take 2 tabs every 6 hrs as needed for muscle spasm  Orders: Medicare Electronic  Prescription [phone]) Ketorolac-Toradol 15mg  (U0454) Admin of Therapeutic Inj  intramuscular or subcutaneous (09811)  Problem # 4:  HYPERLIPIDEMIA (ICD-272.4) Assessment: Comment Only  Her updated medication list for this problem includes:    Simvastatin 20 Mg Tabs (Simvastatin) ..... One tab by mouth qhs  Orders: T-Lipid Profile  ([id number]) T-Hepatic Function [phone]) Low fat dietdiscussed and encouraged  Labs Reviewed: SGOT: 17 (07/15/2009)   SGPT: 9 (07/15/2009)   HDL:39 (12/16/2008), 31 (09/16/2008)  LDL:110 (12/16/2008), 137 (09/16/2008)  Chol:191 (12/16/2008), 220 (09/16/2008)  Trig:209 (12/16/2008), 261 (09/16/2008)  Problem # 5:  ESSENTIAL HYPERTENSION (ICD-401.9) Assessment: Improved  Her updated medication list for this problem includes:    Maxzide-25 37.5-25 Mg Tabs (Triamterene-hctz) .Marland Kitchen... Take one and a half tablet daily  Orders: T-Basic Metabolic Panel [phone])  BP today: 110/82 Prior BP: 124/90 (01/19/2010)  Labs Reviewed: K+: 3.5 (01/19/2010) Creat: : 0.79 (01/19/2010)   Chol: 191 (12/16/2008)   HDL: 39 (12/16/2008)   LDL: 110 (12/16/2008)   TG: 209 (12/16/2008)  Problem # 6:  NICOTINE ADDICTION (ICD-305.1) Assessment: Unchanged  Encouraged smoking cessation and discussed different methods for smoking cessation.   Complete Medication List: 1)  Lamictal 150 Mg Tabs (Lamotrigine) .... Take 1 tablet by mouth two times a day 2)  Diazepam 5 Mg Tabs (Diazepam) .... Take 1 tablet by mouth three times a day 3)  Maxzide-25 37.5-25 Mg Tabs (Triamterene-hctz) .... Take one and a half tablet daily 4)  Simvastatin 20 Mg Tabs (Simvastatin) .... One tab by mouth qhs 5)  Miralax Powd (Polyethylene glycol 3350) .Marland KitchenMarland KitchenMarland Kitchen 17 grams by mouth daily as needed for constipation 6)  Potassium Chloride 20 Meq/63ml (10%) Liqd (Potassium chloride) .... 30 cc two times a day 7)  Spiriva Handihaler 18 Mcg Caps (Tiotropium bromide monohydrate) .... One inhalation daily 8)  Prozac 20 Mg Caps (Fluoxetine hcl) .... One cap by mouth once daily 9)  Thiothixene 10 Mg Caps (Thiothixene) .... One cap by mouth qhs 10)  Methocarbamol 500 Mg Tabs (Methocarbamol) .... Take 2 tabs every 6 hrs as needed for muscle spasm 11)  Doxepin Hcl 10 Mg Caps (Doxepin hcl) .... Take 1 tab by mouth at bedtime 12)  Carbamazepine 200 Mg  Tabs (Carbamazepine) .... Take 1 tablet by mouth two times a day 13)  Septra Ds 800-160 Mg Tabs (Sulfamethoxazole-trimethoprim) .... Take 1 tablet by mouth two times a day  Other Orders: T-CBC w/Diff ([id number]) T- Hemoglobin A1C (36644-03474) T-TSH [phone]) T-Vitamin D (25-Hydroxy) (43329-51884)  Patient Instructions: 1)  Please schedule a follow-up appointment in 4 months. 2)  Tobacco is very bad for your health and your loved ones! You Should stop smoking!. 3)  Stop Smoking Tips: Choose a Quit date. Cut down before the Quit date. decide what you will do as a substitute when you feel the urge to smoke(gum,toothpick,exercise). 4)  It is important that you exercise regularly at least 20 minutes 5 times a week. If you develop chest pain, have severe difficulty breathing, or feel very tired , stop exercising immediately and seek medical attention. 5)  You need to lose weight. Consider a lower calorie diet and regular exercise.  6)  BMP prior to visit, ICD-9: 7)  Hepatic Panel prior to visit, ICD-9:  fasting 2ndweek in Dec 8)  Lipid Panel prior to visit, ICD-9: 9)  CBC w/ Diff prior to visit, ICD-9: 10)  HbgA1C prior to visit, ICD-9: 11)  vit d 12)  TSH prior to visit, ICD-9: Prescriptions: METHOCARBAMOL 500 MG TABS (METHOCARBAMOL) take  2 tabs every 6 hrs as needed for muscle spasm  #60 Each x 4   Entered by:   Adella Hare LPN   Authorized by:   Syliva Overman MD   Signed by:   Adella Hare LPN on 16/01/9603   Method used:   Electronically to        [facility] [address] [phone]* (retail)       [address] [place], [place]  [address]       Ph: [phone]       Fax: [phone]   RxID:   [id number] SEPTRA DS 800-160 MG TABS (SULFAMETHOXAZOLE-TRIMETHOPRIM) Take 1 tablet by mouth two times a day  #14 x 0   Entered and Authorized by:   Syliva Overman MD   Signed by:   Syliva Overman MD on 03/15/2010   Method used:   Electronically to        [facility] [address] [phone]* (retail)       [address] [place], [place]  [address]       Ph: [phone]       Fax: [phone]   RxID:   [id number]    Medication Administration  Injection # 1:    Medication: Ketorolac-Toradol 15mg     Diagnosis: BACK PAIN, CHRONIC (ICD-724.5)    Route: IM    Site: RUOQ gluteus    Exp Date: 02/22/2011    Lot #: [id number]    Mfr: [email or web address]    Comments: toradol 60mg  given    Patient tolerated injection without complications    Given by: Adella Hare LPN (March 15, 2010 1:24 PM)  Orders Added: 1)  Est. Patient Level IV [99214] 2)  Medicare Electronic Prescription [G8553] 3)  T-Basic Metabolic Panel [80048-22910] 4)  T-Lipid Profile [80061-22930] 5)  T-Hepatic Function [80076-22960] 6)  T-CBC w/Diff [02542-70623] 7)  T- Hemoglobin A1C [83036-23375] 8)  T-TSH [76283-15176] 9)  T-Vitamin D (25-Hydroxy) [16073-71062] 10)  Ketorolac-Toradol 15mg  [J1885] 11)  Admin of Therapeutic Inj  intramuscular or subcutaneous [96372] 12)  Dermatology Referral [Derma]     Medication Administration  Injection # 1:    Medication: Ketorolac-Toradol 15mg     Diagnosis: BACK PAIN, CHRONIC (ICD-724.5)    Route: IM    Site: RUOQ gluteus    Exp Date: 02/22/2011    Lot #: [id number]    Mfr: [email or web address]    Comments: toradol 60mg  given    Patient tolerated injection without complications    Given by: Adella Hare LPN (March 15, 2010 1:24 PM)  Orders Added: 1)  Est. Patient Level IV [27035] 2)  Medicare Electronic Prescription [G8553] 3)  T-Basic Metabolic Panel [80048-22910] 4)  T-Lipid Profile [80061-22930] 5)  T-Hepatic Function [80076-22960] 6)  T-CBC w/Diff [00938-18299] 7)  T- Hemoglobin A1C [83036-23375] 8)  T-TSH [37169-67893] 9)  T-Vitamin D (25-Hydroxy) [81017-51025] 10)  Ketorolac-Toradol 15mg  [J1885] 11)  Admin of Therapeutic Inj  intramuscular or subcutaneous [96372] 12)  Dermatology Referral [Derma]

## 2010-05-23 NOTE — Progress Notes (Signed)
Summary: MEDICINE  Phone Note Call from Patient   Summary of Call: THE MEDICINE THAT WAS CALLED IN MEDICAID WILL NOT PAY  CALL AT [phone] Initial call taken by: Lind Guest,  March 23, 2009 9:09 AM  Follow-up for Phone Call        advise her i have checked, medicaid pays for none of them, she will have to wait until the stomach docs can see her to see if they can help her , sorry  Follow-up by: Syliva Overman MD,  March 23, 2009 12:10 PM  Additional Follow-up for Phone Call Additional follow up Details #1::        Patient aware Additional Follow-up by: Everitt Amber,  March 23, 2009 12:52 PM

## 2010-05-23 NOTE — Assessment & Plan Note (Signed)
Summary: OV   Vital Signs:  Patient profile:   40 year old female Menstrual status:  hysterectomy Height:      63 inches Weight:      173.50 pounds BMI:     30.85 Temp:     98 degrees F oral Pulse rate:   83 / minute Pulse rhythm:   regular Resp:     16 per minute BP sitting:   124 / 88  (left arm)  Vitals Entered By: Worthy Keeler LPN (April 20, 2009 4:55 PM)  Nutrition Counseling: Patient's BMI is greater than 25 and therefore counseled on weight management options. CC: sore throat, body aches, chills, head congestion   Primary Care Provider:  Dr. Berenice Primas  CC:  sore throat, body aches, chills, and head congestion.  History of Present Illness: Pt presents with generalised body aches, fever , chills qnd respiratory symptoms in the past week which have worsened. she also recently slipped and now hasa bruised and painful right hip. Seh still smokes , and has not been consitent inlifestyle changes to facilitate weight loss.  Preventive Screening-Counseling & Management  Alcohol-Tobacco     Smoking Cessation Counseling: yes  Allergies (verified): 1)  ! Iodine 2)  ! Depakote 3)  ! Codeine 4)  ! Lithium Carbonate (Lithium Carbonate) 5)  Pcn  Review of Systems General:  Complains of chills, fatigue, fever, loss of appetite, and malaise; 1 week history. Eyes:  Denies blurring and discharge. ENT:  Complains of hoarseness, postnasal drainage, sinus pressure, and sore throat; 1 week h/o sinus pressure and green drainage no better. CV:  Denies chest pain or discomfort, palpitations, and swelling of feet. Resp:  Complains of cough, excessive snoring, hypersomnolence, morning headaches, shortness of breath, sputum productive, and wheezing; ch. GU:  Complains of urinary hesitancy; had procedure by Dr Rito Ehrlich last week had som ureteral obstruction per pt , and she still is symptomatic. MS:  Complains of joint pain; fell on ice and hit right hip 2 days ago, now ac hing wants no x  ray. Neuro:  Denies headaches, seizures, and sensation of room spinning. Psych:  Complains of anxiety, depression, mental problems, panic attacks, and sense of great danger; denies suicidal thoughts/plans, thoughts of violence, and unusual visions or sounds. Endo:  Denies cold intolerance, excessive hunger, excessive thirst, excessive urination, heat intolerance, polyuria, and weight change. Heme:  Denies abnormal bruising and bleeding. Allergy:  Complains of seasonal allergies.  Physical Exam  General:  Well-developed,obese,in no acute distress; alert,appropriate and cooperative throughout examination HEENT: No facial asymmetry,  EOMI, positive sinus tenderness, TM's Clear, oropharynx  pink and moist.   Chest: decreased air entry, scattered crackles and wheezes CVS: S1, S2, No murmurs, No S3.   Abd: Soft, Nontender.  MS: decreased  ROM spine and  hips,adequate in shoulders and knees.  Ext: No edema.   CNS: CN 2-12 intact, power tone and sensation normal throughout.   Skin: Intact, no visible lesions or rashes.  Psych: Good eye contact, normal affect.  Memory intact, not anxious or depressed appearing. '   Impression & Recommendations:  Problem # 1:  HIP PAIN, RIGHT (ICD-719.45) Assessment Deteriorated prednisone dose pack prscribed  Problem # 2:  ACUTE BRONCHITIS (ICD-466.0) Assessment: Comment Only  Her updated medication list for this problem includes:    Zithromax 250 Mg Tabs (Azithromycin) .Marland Kitchen..Marland Kitchen Two tablets today then one tablet daily for the next 4 days  Problem # 3:  ACUTE SINUSITIS, UNSPECIFIED (ICD-461.9) Assessment: Comment Only  Her updated medication list for this problem includes:    Zithromax 250 Mg Tabs (Azithromycin) .Marland Kitchen..Marland Kitchen Two tablets today then one tablet daily for the next 4 days  Problem # 4:  COPD (ICD-496) Assessment: Unchanged  Problem # 5:  NICOTINE ADDICTION (ICD-305.1) Assessment: Unchanged  Encouraged smoking cessation and discussed different  methods for smoking cessation.   Problem # 6:  ESSENTIAL HYPERTENSION (ICD-401.9) Assessment: Improved  Her updated medication list for this problem includes:    Maxzide-25 37.5-25 Mg Tabs (Triamterene-hctz) .Marland Kitchen... Take one and a half tablet daily  BP today: 124/88 Prior BP: 150/100 (03/21/2009)  Labs Reviewed: K+: 3.4 (04/11/2009) Creat: : 0.72 (03/28/2009)   Chol: 191 (12/16/2008)   HDL: 39 (12/16/2008)   LDL: 110 (12/16/2008)   TG: 209 (12/16/2008)  Complete Medication List: 1)  Lamictal 150 Mg Tabs (Lamotrigine) .... Take 1 tablet by mouth two times a day 2)  Alprazolam 1 Mg Tabs (Alprazolam) .... Take one tab in the am and two tabs the pm 3)  Maxzide-25 37.5-25 Mg Tabs (Triamterene-hctz) .... Take one and a half tablet daily 4)  Simvastatin 20 Mg Tabs (Simvastatin) .... One tab by mouth qhs 5)  Promethazine Hcl 25 Mg Tabs (Promethazine hcl) .... Take 1 tablet by mouth three times a day as needed 6)  Thiothixene 2 Mg Caps (Thiothixene) .... One cap by mouth qhs 7)  Omeprazole 20 Mg Tbec (Omeprazole) .... One by mouth daily 30 min before breakfast 8)  Miralax Powd (Polyethylene glycol 3350) .Marland KitchenMarland KitchenMarland Kitchen 17 grams by mouth daily as needed for constipation 9)  Nitrofurantoin Macrocrystal 50 Mg Caps (Nitrofurantoin macrocrystal) .... Take 1 tab by mouth at bedtime 10)  Potassium Chloride 20 Meq/42ml (10%) Liqd (Potassium chloride) .... 30 cc two times a day 11)  Dicyclomine Hcl 10 Mg Caps (Dicyclomine hcl) .... Take 1 capsule by mouth two times a day  as needed 12)  Fluoxetine Hcl 10 Mg Caps (Fluoxetine hcl) .... One cap by mouth qd 13)  Prednisone (pak) 5 Mg Tabs (Prednisone) .... Use as directed 14)  Zithromax 250 Mg Tabs (Azithromycin) .... Two tablets today then one tablet daily for the next 4 days  Patient Instructions: 1)  Please schedule a follow-up appointment in 1 month. 2)  Tobacco is very bad for your health and your loved ones! You Should stop smoking!. 3)  Stop Smoking Tips:  Choose a Quit date. Cut down before the Quit date. decide what you will do as a substitute when you feel the urge to smoke(gum,toothpick,exercise). 4)  It is important that you exercise regularly at least 20 minutes 5 times a week. If you develop chest pain, have severe difficulty breathing, or feel very tired , stop exercising immediately and seek medical attention. 5)  You need to lose weight. Consider a lower calorie diet and regular exercise.  6)  Meds are sent in for sinusitis , bronchitis and hip pain, pls use robitussin or mucinex oTC for chest cogestion and cough Prescriptions: ZITHROMAX 250 MG TABS (AZITHROMYCIN) two tablets today then one tablet daily for the next 4 days  #6 x 0   Entered and Authorized by:   Syliva Overman MD   Signed by:   Syliva Overman MD on 04/20/2009   Method used:   Electronically to        [facility] [address] [phone]* (retail)       [address]       [place], [place]  [address]  Ph: [phone]       Fax: [phone]   RxID:   [id number] PREDNISONE (PAK) 5 MG TABS (PREDNISONE) Use as directed  #21 x 0   Entered and Authorized by:   Syliva Overman MD   Signed by:   Syliva Overman MD on 04/20/2009   Method used:   Electronically to        [facility] [address] [phone]* (retail)       [address] [place]  [address]       Ph: [phone]       Fax: [phone]   RxID:   [id number]

## 2010-05-23 NOTE — Progress Notes (Signed)
Summary: meds  Phone Note Call from Patient   Summary of Call: has a question for nurse. wants to know why dr. Lodema Hong want refill med [phone] ibuprofen Initial call taken by: Rudene Anda,  December 22, 2008 11:42 AM  Follow-up for Phone Call        advise pt she is on mobic which is like ibuprofen , she cannot take both Follow-up by: Syliva Overman MD,  December 22, 2008 11:57 AM  Additional Follow-up for Phone Call Additional follow up Details #1::        returned call, no answer Additional Follow-up by: Worthy Keeler LPN,  December 22, 2008 12:00 PM    Additional Follow-up for Phone Call Additional follow up Details #2::    returned call, no answer Follow-up by: Worthy Keeler LPN,  December 23, 2008 8:22 AM

## 2010-05-23 NOTE — Progress Notes (Signed)
Summary: ct scan   Phone Note Call from Patient   Summary of Call: needs to speak with nurse about ct scan and now her bladder hurting. [phone]   [phone] would like to see if she can get a shot today for the pain Initial call taken by: Rudene Anda,  March 14, 2009 8:52 AM  Follow-up for Phone Call        She said that the hospital gave ehr a shot of hormones to expand her bladder? And now she is in so much pain she can't h ardly stand it. Wants a shot today for the pain and what does she need to do? Follow-up by: Everitt Amber,  March 14, 2009 3:23 PM  Additional Follow-up for Phone Call Additional follow up Details #1::        advise her to see the doc who treated her with the shots let them handle the pain Additional Follow-up by: Syliva Overman MD,  March 14, 2009 5:18 PM

## 2010-05-23 NOTE — Progress Notes (Signed)
Summary: LAB WORK  Phone Note Call from Patient   Summary of Call: [facility] LAB WORK Initial call taken by: Lind Guest,  January 20, 2009 11:26 AM  Follow-up for Phone Call        noted Follow-up by: Everitt Amber,  January 20, 2009 11:47 AM

## 2010-05-23 NOTE — Progress Notes (Signed)
Summary: HURTING  Phone Note Call from Patient   Summary of Call: HURTING BAD SHE WAS WONDERING IF YOU KNEW ANYTHING ABOUT HER  URINE TEST YESTERDAY BECAUSE SHE HAD BLOOD IN IT CALL BACK AT [phone] Initial call taken by: Lind Guest,  January 28, 2009 11:26 AM  Follow-up for Phone Call        Returned call, no answer. Culture not avaliable at this time. Will call patient with results when they come in Follow-up by: Everitt Amber,  January 28, 2009 1:38 PM  Additional Follow-up for Phone Call Additional follow up Details #1::        Already spoken with patient about this matter. See previous phone msg Additional Follow-up by: Everitt Amber,  January 28, 2009 3:04 PM

## 2010-05-23 NOTE — Progress Notes (Signed)
Summary: WENT TO ER  Phone Note Call from Patient   Summary of Call: WENT TO ER AND HER POTAS. IS DOWN AND HAS APPT WITH A GI DOC [place] WANTED TO LET U KNOW THAT Initial call taken by: Lind Guest,  April 04, 2009 11:26 AM  Follow-up for Phone Call        noted, advise and order potassium asa stat next Monday pls Follow-up by: Syliva Overman MD,  April 04, 2009 5:35 PM  Additional Follow-up for Phone Call Additional follow up Details #1::        Phone Call Completed, order sent Additional Follow-up by: Worthy Keeler LPN,  April 05, 2009 9:46 AM

## 2010-05-23 NOTE — Progress Notes (Signed)
Summary: call  Phone Note Call from Patient   Summary of Call: sick again this morning  throwed up and at the ed when she went they gave her promethazine 25mg   for 2 x a day only enough for 5 days will [facility] to [facility] rx call her at [phone] Initial call taken by: Lind Guest,  January 13, 2009 8:38 AM  Follow-up for Phone Call        let her know I sent in a script, but if she continues to have this problem will need to seea stomach specialist Follow-up by: Syliva Overman MD,  January 13, 2009 1:19 PM  Additional Follow-up for Phone Call Additional follow up Details #1::        Phone Call Completed Additional Follow-up by: Worthy Keeler LPN,  January 13, 2009 1:41 PM    New/Updated Medications: PROMETHAZINE HCL 25 MG TABS (PROMETHAZINE HCL) Take 1 tablet by mouth three times a day as needed Prescriptions: PROMETHAZINE HCL 25 MG TABS (PROMETHAZINE HCL) Take 1 tablet by mouth three times a day as needed  #30 x 0   Entered and Authorized by:   Syliva Overman MD   Signed by:   Syliva Overman MD on 01/13/2009   Method used:   Electronically to        [facility] [address] [phone]* (retail)       [address] [place]  [address]       Ph: [phone]       Fax: [phone]   RxID:   [id number]

## 2010-05-23 NOTE — Progress Notes (Signed)
Summary: referral for [facility]  Phone Note Outgoing Call   Call placed by: shannon Call placed to: Patient Summary of Call: called patient about referral to Dr. Daisy Blossom office.  [phone] she said that she was avaliable any day this month except the 8th.  Follow-up for Phone Call        patient has an appt. with Dr. Malvin Johns on Dec. 14 at 11. Follow-up by: Curtis Sites,  March 29, 2010 2:16 PM

## 2010-05-23 NOTE — Letter (Signed)
Summary: medicare supplies  medicare supplies   Imported By: Lind Guest 10/18/2008 08:33:19  _____________________________________________________________________  External Attachment:    Type:   Image     Comment:   External Document

## 2010-05-23 NOTE — Progress Notes (Signed)
Summary: medicine  Phone Note Call from Patient   Summary of Call: says her chkol medicine still isn't at pharm. Pt would like to talk with nurse. [phone]  cell [phone] Initial call taken by: Rudene Anda,  November 08, 2008 2:49 PM  Follow-up for Phone Call        Rx Called In Follow-up by: Worthy Keeler LPN,  November 08, 2008 2:50 PM  Additional Follow-up for Phone Call Additional follow up Details #1::        insurance will not pay for lipitor, needs alternative Additional Follow-up by: Worthy Keeler LPN,  November 08, 2008 2:52 PM    Additional Follow-up for Phone Call Additional follow up Details #2::    advise and erx simvastatin 20mg  Take 1 tab by mouth at bedtime #30 refill3 pls Follow-up by: Syliva Overman MD,  November 08, 2008 5:16 PM  Additional Follow-up for Phone Call Additional follow up Details #3:: Details for Additional Follow-up Action Taken: rx sent, left detailed message for patient Additional Follow-up by: Worthy Keeler LPN,  November 09, 2008 9:42 AM  New/Updated Medications: SIMVASTATIN 20 MG TABS (SIMVASTATIN) one tab by mouth qhs Prescriptions: SIMVASTATIN 20 MG TABS (SIMVASTATIN) one tab by mouth qhs  #30 x 3   Entered by:   Worthy Keeler LPN   Authorized by:   Syliva Overman MD   Signed by:   Worthy Keeler LPN on 32/95/1884   Method used:   Electronically to        [facility] [address] [phone]* (retail)       [address], [place]  [address]       Ph: [phone]       Fax: [phone]   RxID:   [id number] LIPITOR 20 MG TABS (ATORVASTATIN CALCIUM) Take 1 tab by mouth at bedtime  #30 x 2   Entered by:   Worthy Keeler LPN   Authorized by:   Syliva Overman MD   Signed by:   Worthy Keeler LPN on 37/01/6268   Method used:   Electronically to        [facility] [address] [phone]* (retail)       [address], [place]  [address]       Ph: [phone]       Fax: [phone]   RxID:   [id number]

## 2010-05-23 NOTE — Progress Notes (Signed)
Summary: PAIN  Phone Note Call from Patient   Summary of Call: WANTS TO KNOW WHAT SHE CAN TAKE FOR PAIN. [phone]  CELL [phone] Initial call taken by: Rudene Anda,  January 27, 2009 4:41 PM  Follow-up for Phone Call        where is the pain, what causes it etc.  Follow-up by: Syliva Overman MD,  January 27, 2009 8:36 PM  Additional Follow-up for Phone Call Additional follow up Details #1::        feels like its in her kidneys on both sides. Burning when she urinates. Running a fever this morning of  101. She has been laying around all day hurting. Advised urgent care for eval.  Additional Follow-up by: Everitt Amber,  January 28, 2009 2:31 PM

## 2010-05-23 NOTE — Letter (Signed)
Summary: Radiology Test Reminder  [facility]  [address]   [place], [place] [address]   Phone: [phone]  Fax: [phone]     February 21, 2009   Brooke Maddox [address] [place], [place]  [address] 11/03/1970  Dear [name],  During your last appointment, your doctor requested you have a CT Scan.  Our records indicate you have not had this done.  Remember it is very important to follow your doctor's instructions.  Please have this done as soon as possible.  If you have questions regarding this appointment, please call our office and we can reschedule this for you.  It is important that patients and their doctor work together in the management and treatment of their health care.  If you have already had your test done, please disregard this letter.  Thank you,    Ave Filter  [facility] Ph: [phone]   Fax: [phone]

## 2010-05-23 NOTE — Progress Notes (Signed)
Summary: SEEING DOUBLE  Phone Note Call from Patient   Summary of Call: SHE IS SEEING DOUBLE VISION EVEN EVEN WITH HER GLASSES ON AND OFF  WANTS A NURSE TO CALL HER ASAP SHE LEFT A MESSAGE Initial call taken by: Lind Guest,  January 24, 2010 11:12 AM  Follow-up for Phone Call        Want her to go to ER or come back here? Follow-up by: Everitt Amber LPN,  January 24, 2010 12:57 PM  Additional Follow-up for Phone Call Additional follow up Details #1::        ER Additional Follow-up by: Esperanza Sheets PA,  January 24, 2010 1:10 PM    Additional Follow-up for Phone Call Additional follow up Details #2::    called patient and left message to go to the ER and to call back with any questions Follow-up by: Everitt Amber LPN,  January 24, 2010 1:47 PM   Appended Document: SEEING DOUBLE PATINET CALLED BACK AND LEFT MESSAGE  I CALLLED HER BACK AND SHE JUST HER HEARD THE MESSAGE

## 2010-05-23 NOTE — Letter (Signed)
Summary: Letter  Letter   Imported By: Lind Guest 09/21/2008 10:01:28  _____________________________________________________________________  External Attachment:    Type:   Image     Comment:   External Document

## 2010-05-23 NOTE — Progress Notes (Signed)
Summary: MEDICINE NOT WORKING  Phone Note Call from Patient   Summary of Call: GLANDS STILL SWOLLEN SEEMS THE PENICILLIN IS NOT WORKING EYES ITCHING  WANTS TO KNOW WHAT TO DO AND CALL BACK AT [phone] Initial call taken by: Lind Guest,  September 21, 2008 11:28 AM  Follow-up for Phone Call        pt reports itchy eyes since pCN , plsnote as adverse rxn in her chart, IO have already told her to stop it.  Pls erx septra ds Take 1 tablet by mouth two times a day #14 0nly, she knows about this, also she knows to call bak if symptoms persist orworsen.  Follow-up by: Syliva Overman MD,  September 21, 2008 12:37 PM  Additional Follow-up for Phone Call Additional follow up Details #1::        Rx Called In Additional Follow-up by: Worthy Keeler LPN,  September 22, 3242 3:26 PM   New Allergies: PCN New/Updated Medications: SEPTRA DS 800-160 MG TABS (SULFAMETHOXAZOLE-TRIMETHOPRIM) one tab by mouth bid New Allergies: PCN  Prescriptions: SEPTRA DS 800-160 MG TABS (SULFAMETHOXAZOLE-TRIMETHOPRIM) one tab by mouth bid  #14 x 0   Entered by:   Worthy Keeler LPN   Authorized by:   Syliva Overman MD   Signed by:   Worthy Keeler LPN on 04/25/7251   Method used:   Electronically to        [facility] [address] [phone]* (retail)       [address] [place]  [address]       Ph: [phone]       Fax: [phone]   RxID:   [id number]

## 2010-05-23 NOTE — Assessment & Plan Note (Signed)
Summary: physical   Vital Signs:  Patient profile:   40 year old female Menstrual status:  hysterectomy Height:      63 inches Weight:      167.13 pounds BMI:     29.71 Pulse rate:   96 / minute Pulse rhythm:   regular Resp:     16 per minute BP sitting:   108 / 70  (left arm)  Vitals Entered By: Worthy Keeler LPN (February 03, 2009 10:10 AM)  Nutrition Counseling: Patient's BMI is greater than 25 and therefore counseled on weight management options. CC: physical Is Patient Diabetic? No Pain Assessment Patient in pain? yes     Location: bones Intensity: 9 Type: aching Onset of pain  Constant  Vision Screening:Left eye with correction: 20 / 40 Right eye with correction: 20 / 40 Both eyes with correction: 20 / 40        Vision Entered By: Worthy Keeler LPN (February 03, 2009 10:18 AM)   CC:  physical.  History of Present Illness: pt reports that hse has been doing fairly well. She denies any recent fever or chiills, burt continues to experience fatigue and malaise. She also has symptomsof uTI which seem to persist. She c/o increased mid and low back opain iin the past week and states the toradol injection helped alot. She  continues to attempt smoking cessation, and is invested in lifestyle changes t facilitate weight loss  Preventive Screening-Counseling & Management  Alcohol-Tobacco     Smoking Cessation Counseling: yes  Current Medications (verified): 1)  Lamictal 150 Mg Tabs (Lamotrigine) .... Take 1 Tablet By Mouth Two Times A Day 2)  Alprazolam 1 Mg Tabs (Alprazolam) .... Take One Tab in The Am and Two Tabs The Pm 3)  Flexeril 10 Mg Tabs (Cyclobenzaprine Hcl) .... Take 1 Tab By Mouth At Bedtime 4)  Maxzide-25 37.5-25 Mg Tabs (Triamterene-Hctz) .... Take 1 Tablet By Mouth Once A Day 5)  Simvastatin 20 Mg Tabs (Simvastatin) .... One Tab By Mouth Qhs 6)  Klor-Con M20 20 Meq Cr-Tabs (Potassium Chloride Crys Cr) .... Take 1 Tablet By Mouth Once A Day 7)   Ranitidine Hcl 150 Mg Tabs (Ranitidine Hcl) .... One Tab By Mouth Bid 8)  Promethazine Hcl 25 Mg Tabs (Promethazine Hcl) .... Take 1 Tablet By Mouth Three Times A Day As Needed 9)  Pyridium 200 Mg Tabs (Phenazopyridine Hcl) .... One Tab By Mouth Three Times A Day 10)  Thiothixene 2 Mg Caps (Thiothixene) .... One Cap By Mouth Qhs  Allergies (verified): 1)  ! Iodine 2)  ! Depakote 3)  ! Codeine 4)  Pcn  Review of Systems General:  Complains of fatigue and malaise; denies chills and fever; pt reports recurrent dysuria and frequency with no evidence of uTi, requests eval by urology for interstitial cystits. Eyes:  Denies discharge and red eye. ENT:  Denies earache, hoarseness, nasal congestion, sinus pressure, and sore throat. Resp:  Denies cough and sputum productive. GI:  Denies abdominal pain, constipation, diarrhea, nausea, and vomiting. GU:  Complains of dysuria and urinary frequency. MS:  Complains of joint pain, low back pain, and mid back pain. Derm:  Denies itching, lesion(s), and rash. Neuro:  Denies headaches, poor balance, seizures, and sensation of room spinning. Psych:  Complains of anxiety and depression; denies easily angered, easily tearful, irritability, suicidal thoughts/plans, thoughts of violence, and unusual visions or sounds. Endo:  Denies cold intolerance, excessive hunger, excessive thirst, excessive urination, heat intolerance, polyuria, and weight change. Heme:  Denies abnormal bruising and bleeding. Allergy:  Complains of seasonal allergies; denies hives or rash and itching eyes.   Impression & Recommendations:  Problem # 1:  SCREENING FOR MALIGNANT NEOPLASM OF THE CERVIX (ICD-V76.2) Assessment Comment Only  Orders: Pap Smear (60454)  Problem # 2:  UNSPECIFIED CYSTITIS (ICD-595.9) Assessment: Comment Only  Her updated medication list for this problem includes:    Ciprofloxacin Hcl 500 Mg Tabs (Ciprofloxacin hcl) .Marland Kitchen... Take 1 tablet by mouth two times a  day  Orders: Urology Referral (Urology)  Encouraged to push clear liquids, get enough rest, and take acetaminophen as needed. To be seen in 10 days if no improvement, sooner if worse.  Problem # 3:  NICOTINE ADDICTION (ICD-305.1) Assessment: Improved  Encouraged smoking cessation and discussed different methods for smoking cessation.   Problem # 4:  OBESITY, UNSPECIFIED (ICD-278.00) Assessment: Improved  Ht: 63 (02/03/2009)   Wt: 167.13 (02/03/2009)   BMI: 29.71 (02/03/2009)  Problem # 5:  HYPERLIPIDEMIA (ICD-272.4) Assessment: Comment Only  Her updated medication list for this problem includes:    Simvastatin 20 Mg Tabs (Simvastatin) ..... One tab by mouth qhs  Orders: T-Hepatic Function [phone]) T-Lipid Profile [phone])  Labs Reviewed: SGOT: 26 (12/16/2008)   SGPT: 16 (12/16/2008)   HDL:39 (12/16/2008), 31 (09/16/2008)  LDL:110 (12/16/2008), 137 (09/16/2008)  Chol:191 (12/16/2008), 220 (09/16/2008)  Trig:209 (12/16/2008), 261 (09/16/2008)  Problem # 6:  HYPOKALEMIA (ICD-276.8) Assessment: Comment Only chem 7 today  Problem # 7:  BACK PAIN, CHRONIC (ICD-724.5) Assessment: Deteriorated  Her updated medication list for this problem includes:    Flexeril 10 Mg Tabs (Cyclobenzaprine hcl) .Marland Kitchen... Take 1 tab by mouth at bedtime  Orders: Ketorolac-Toradol 15mg  ([id number]) Admin of Therapeutic Inj  intramuscular or subcutaneous (96295)  Complete Medication List: 1)  Lamictal 150 Mg Tabs (Lamotrigine) .... Take 1 tablet by mouth two times a day 2)  Alprazolam 1 Mg Tabs (Alprazolam) .... Take one tab in the am and two tabs the pm 3)  Flexeril 10 Mg Tabs (Cyclobenzaprine hcl) .... Take 1 tab by mouth at bedtime 4)  Maxzide-25 37.5-25 Mg Tabs (Triamterene-hctz) .... Take 1 tablet by mouth once a day 5)  Simvastatin 20 Mg Tabs (Simvastatin) .... One tab by mouth qhs 6)  Klor-con M20 20 Meq Cr-tabs (Potassium chloride crys cr) .... Take 1 tablet by mouth two times a day 7)   Ranitidine Hcl 150 Mg Tabs (Ranitidine hcl) .... One tab by mouth bid 8)  Promethazine Hcl 25 Mg Tabs (Promethazine hcl) .... Take 1 tablet by mouth three times a day as needed 9)  Pyridium 200 Mg Tabs (Phenazopyridine hcl) .... One tab by mouth three times a day 10)  Thiothixene 2 Mg Caps (Thiothixene) .... One cap by mouth qhs 11)  Ciprofloxacin Hcl 500 Mg Tabs (Ciprofloxacin hcl) .... Take 1 tablet by mouth two times a day 12)  Fluconazole 150 Mg Tabs (Fluconazole) .... Take 1 tablet by mouth once a day as needed  Other Orders: T-Urinalysis ([id number]) T-Culture, Urine ([id number]) T-Basic Metabolic Panel [id number]) T-Basic Metabolic Panel ([id number])  Patient Instructions: 1)  Please schedule a follow-up appointment in early January, pLS cancel any earlier appts 2)  Tobacco is very bad for your health and your loved ones! You Should stop smoking!. 3)  Stop Smoking Tips: Choose a Quit date. Cut down before the Quit date. decide what you will do as a substitute when you feel the urge to smoke(gum,toothpick,exercise). 4)  It is important that you exercise regularly at  least 20 minutes 5 times a week. If you develop chest pain, have severe difficulty breathing, or feel very tired , stop exercising immediately and seek medical attention. 5)  You need to lose weight. Consider a lower calorie diet and regular exercise. Congrats, you have lost an additional 3 pounds! 6)  BMP prior to visit, ICD-9: stat today. 7)  no med changes at this time. 8)  Pls reschedule your  eye exam. 9)  You will be referred to urology about recurrent cystitis symptoms  10)  BMP prior to visit, ICD-9: 11)  Hepatic Panel prior to visit, ICD-9:fastin , early January 12)  Lipid Panel prior to visit, ICD-9: Prescriptions: FLUCONAZOLE 150 MG TABS (FLUCONAZOLE) Take 1 tablet by mouth once a day as needed  #3 x 0   Entered and Authorized by:   Syliva Overman MD   Signed by:   Syliva Overman MD on 02/04/2009    Method used:   Electronically to        [facility] [address] [phone]* (retail)       [address] [place], [place]  [address]       Ph: [phone]       Fax: [phone]   RxID:   [id number] CIPROFLOXACIN HCL 500 MG TABS (CIPROFLOXACIN HCL) Take 1 tablet by mouth two times a day  #14 x 0   Entered and Authorized by:   Syliva Overman MD   Signed by:   Syliva Overman MD on 02/04/2009   Method used:   Electronically to        [facility] [address] [phone]* (retail)       [address] [place], [place]  [address]       Ph: [phone]       Fax: [phone]   RxID:   [id number] KLOR-CON M20 20 MEQ CR-TABS (POTASSIUM CHLORIDE CRYS CR) Take 1 tablet by mouth two times a day  #60 x 3   Entered by:   Everitt Amber   Authorized by:   Syliva Overman MD   Signed by:   Everitt Amber on 02/03/2009   Method used:   Electronically to        [facility] [address] [phone]* (retail)       [address] [place], [place]  [address]       Ph: [phone]       Fax: [phone]   RxID:   [id number] PROMETHAZINE HCL 25 MG TABS (PROMETHAZINE HCL) Take 1 tablet by mouth three times a day as needed  #30 x 0   Entered by:   Everitt Amber   Authorized by:   Syliva Overman MD   Signed by:   Everitt Amber on 02/03/2009   Method used:   Electronically to        [facility] [address] [phone]* (retail)       [address] [place], [place]  [address]       Ph: [phone]       Fax: [phone]   RxID:   [id number] RANITIDINE HCL 150 MG TABS (RANITIDINE HCL) one tab by mouth bid  #90 x 1   Entered by:   Everitt Amber   Authorized by:   Syliva Overman MD   Signed by:   Everitt Amber on 02/03/2009   Method used:   Electronically  to        [facility] [phone]* (retail)       [address] [place], [place]  [address]       Ph: [phone]       Fax: [phone]   RxID:   [id number]    Medication Administration  Injection # 1:    Medication:  Ketorolac-Toradol 15mg     Diagnosis: BACK PAIN, CHRONIC (ICD-724.5)    Route: IM    Site: RUOQ gluteus    Exp Date: 08/2010    Lot #: [id number]    Mfr: [email or web address]    Comments: 60 mg given    Patient tolerated injection without complications    Given by: Everitt Amber (February 03, 2009 11:21 AM)  Orders Added: 1)  T-Urinalysis [81003-65000] 2)  T-Culture, Urine [[id number]] 3)  Est. Patient 18-39 years [99395] 4)  Pap Smear [88150] 5)  T-Basic Metabolic Panel [[id number]] 6)  T-Basic Metabolic Panel [80048-22910] 7)  T-Hepatic Function [80076-22960] 8)  T-Lipid Profile [80061-22930] 9)  Urology Referral [Urology] 10)  Ketorolac-Toradol 15mg  [J1885] 11)  Admin of Therapeutic Inj  intramuscular or subcutaneous [64332]

## 2010-05-23 NOTE — Progress Notes (Signed)
Summary: call  Phone Note Call from Patient   Summary of Call: call her back at [phone] her anti. lymph nodes  Initial call taken by: Lind Guest,  March 24, 2010 11:47 AM  Follow-up for Phone Call        she is on septra for the lymph nodes Follow-up by: Syliva Overman MD,  March 24, 2010 12:37 PM  Additional Follow-up for Phone Call Additional follow up Details #1::        returned call, no answer Additional Follow-up by: Adella Hare LPN,  March 24, 2010 2:06 PM    Additional Follow-up for Phone Call Additional follow up Details #2::    states the antibiotic is not helping the swollen lymph node states she has been sleeping alot, states she is not depressed, is taking all her meds but she is really tired all the time Follow-up by: Adella Hare LPN,  March 28, 2010 12:58 PM  Additional Follow-up for Phone Call Additional follow up Details #3:: Details for Additional Follow-up Action Taken: irecommend refwerral to dr bradford to eval swollen gland atback of neck, pls refer, let her know Additional Follow-up by: Syliva Overman MD,  March 28, 2010 5:11 PM

## 2010-05-23 NOTE — Assessment & Plan Note (Signed)
Summary: office visit   Vital Signs:  Patient profile:   40 year old female Menstrual status:  hysterectomy Height:      63 inches Weight:      177.06 pounds BMI:     31.48 O2 Sat:      94 % Pulse rate:   106 / minute Pulse rhythm:   regular Resp:     16 per minute BP sitting:   110 / 80  (left arm) Cuff size:   large  Vitals Entered By: Everitt Amber (December 16, 2008 8:08 AM)  Nutrition Counseling: Patient's BMI is greater than 25 and therefore counseled on weight management options. CC: fell and hurt hit her right knee and hit the lower part of her back. The Ibuprofen isn't helping the pain Pain Assessment Patient in pain? yes     Location: right knee and hip Intensity: 10 Type: aching Onset of pain  When she fell 2 days ago   CC:  fell and hurt hit her right knee and hit the lower part of her back. The Ibuprofen isn't helping the pain.  History of Present Illness: t fell 2 days ago and hurt her left knee, she reports pain and swelling.States when she fell somethting piopped ion her knee and she fell back, now c/o inc low back pain across hips. Pt has a h/o falling on the job approx 6 yrs ago, and sustained fractured her spine , had nerve damage, and states she was unable to walk unassisted for 1 yr, states was being tereated at [facility] and was getting patches and the tens. shei9s still trying to obtain copies of these records so that tens use can be justified, and her continued pain complaints addressed. she states that her Nicotine use is  down to half pack per day.She is trying to quit. no sympts associated with her new antihypertensive.  Preventive Screening-Counseling & Management  Alcohol-Tobacco     Smoking Cessation Counseling: yes  Current Medications (verified): 1)  Lamictal 150 Mg Tabs (Lamotrigine) .... Take 1 Tablet By Mouth Two Times A Day 2)  Mirtazapine 15 Mg Tabs (Mirtazapine) .... Take 1 Tablet By Mouth Once A Day 3)  Alprazolam 1 Mg Tabs (Alprazolam) ....  Take One Tab in The Am and Two Tabs The Pm 4)  Ibuprofen 800 Mg Tabs (Ibuprofen) .... Take 1 Tablet By Mouth Three Times A Day 5)  Flexeril 10 Mg Tabs (Cyclobenzaprine Hcl) .... Take 1 Tab By Mouth At Bedtime 6)  Maxzide-25 37.5-25 Mg Tabs (Triamterene-Hctz) .... Take 1 Tablet By Mouth Once A Day 7)  Simvastatin 20 Mg Tabs (Simvastatin) .... One Tab By Mouth Qhs  Allergies (verified): 1)  ! Iodine 2)  ! Depakote 3)  ! Codeine 4)  Pcn  Review of Systems      See HPI General:  Denies chills and fever. Eyes:  Denies discharge and red eye. ENT:  Denies earache, hoarseness, nasal congestion, sinus pressure, and sore throat. CV:  Denies chest pain or discomfort, palpitations, and swelling of feet. Resp:  Denies cough and sputum productive. GI:  Denies abdominal pain, constipation, diarrhea, nausea, and vomiting. GU:  Denies dysuria and urinary frequency. MS:  Complains of joint pain, low back pain, and mid back pain. Derm:  Denies itching and rash. Neuro:  Complains of headaches; denies falling down and seizures. Psych:  Complains of anxiety and depression; denies suicidal thoughts/plans, thoughts of violence, and unusual visions or sounds. Endo:  Denies cold intolerance, excessive hunger, excessive  thirst, excessive urination, heat intolerance, polyuria, and weight change. Heme:  Denies abnormal bruising and bleeding. Allergy:  Denies hives or rash and seasonal allergies.  Physical Exam  General:  alert, well-hydrated, and overweight-appearing.  HEENT: No facial asymmetry,  EOMI, No sinus tenderness, TM's Clear, oropharynx  pink and moist.   Chest: Clear to auscultation bilaterally.  CVS: S1, S2, No murmurs, No S3.   Abd: Soft, Nontender.  MS: decreased ROM spine,  and knee.Adequate in hips and shoulders.  Ext: No edema.   CNS: CN 2-12 intact, power tone and sensation normal throughout.   Skin: Intact, no visible lesions or rashes.  Psych: Good eye contact, normal affect.  Memory  intact, not anxious or depressed appearing.     Impression & Recommendations:  Problem # 1:  KNEE PAIN, RIGHT, ACUTE (ICD-719.46) Assessment Deteriorated  The following medications were removed from the medication list:    Ibuprofen 800 Mg Tabs (Ibuprofen) .Marland Kitchen... Take 1 tablet by mouth three times a day Her updated medication list for this problem includes:    Flexeril 10 Mg Tabs (Cyclobenzaprine hcl) .Marland Kitchen... Take 1 tab by mouth at bedtime    Mobic 15 Mg Tabs (Meloxicam) .Marland Kitchen... Take 1 tablet by mouth once a day  Orders: Ketorolac-Toradol 15mg  ([id number]) Admin of Therapeutic Inj  intramuscular or subcutaneous (66440) Orthopedic Referral (Ortho)  Problem # 2:  HYPERLIPIDEMIA (ICD-272.4) Assessment: Comment Only  Her updated medication list for this problem includes:    Simvastatin 20 Mg Tabs (Simvastatin) ..... One tab by mouth qhs  Orders: T-Hepatic Function [id number]) T-Lipid Profile [id number])    HDL:31 (09/16/2008)  LDL:137 (09/16/2008)  Chol:220 (09/16/2008)  Trig:261 (09/16/2008)  Problem # 3:  BACK PAIN, CHRONIC (ICD-724.5) Assessment: Unchanged  The following medications were removed from the medication list:    Ibuprofen 800 Mg Tabs (Ibuprofen) .Marland Kitchen... Take 1 tablet by mouth three times a day Her updated medication list for this problem includes:    Flexeril 10 Mg Tabs (Cyclobenzaprine hcl) .Marland Kitchen... Take 1 tab by mouth at bedtime    Mobic 15 Mg Tabs (Meloxicam) .Marland Kitchen... Take 1 tablet by mouth once a day  Orders: Orthopedic Referral (Ortho)  Problem # 4:  ESSENTIAL HYPERTENSION (ICD-401.9) Assessment: Improved  Her updated medication list for this problem includes:    Maxzide-25 37.5-25 Mg Tabs (Triamterene-hctz) .Marland Kitchen... Take 1 tablet by mouth once a day  Orders: T-Basic Metabolic Panel [id number])  BP today: 110/80 Prior BP: 140/94 (11/04/2008)  Labs Reviewed: K+: 3.6 (10/20/2008) Creat: : 0.69 (09/16/2008)   Chol: 220 (09/16/2008)   HDL: 31 (09/16/2008)   LDL:  137 (09/16/2008)   TG: 261 (09/16/2008)  Problem # 5:  OBESITY, UNSPECIFIED (ICD-278.00) Assessment: Deteriorated  Ht: 63 (12/16/2008)   Wt: 177.06 (12/16/2008)   BMI: 31.48 (12/16/2008)  Problem # 6:  NICOTINE ADDICTION (ICD-305.1) Assessment: Improved  Encouraged smoking cessation and discussed different methods for smoking cessation.   Complete Medication List: 1)  Lamictal 150 Mg Tabs (Lamotrigine) .... Take 1 tablet by mouth two times a day 2)  Mirtazapine 15 Mg Tabs (Mirtazapine) .... Take 1 tablet by mouth once a day 3)  Alprazolam 1 Mg Tabs (Alprazolam) .... Take one tab in the am and two tabs the pm 4)  Flexeril 10 Mg Tabs (Cyclobenzaprine hcl) .... Take 1 tab by mouth at bedtime 5)  Maxzide-25 37.5-25 Mg Tabs (Triamterene-hctz) .... Take 1 tablet by mouth once a day 6)  Simvastatin 20 Mg Tabs (Simvastatin) .... One tab by mouth qhs  7)  Mobic 15 Mg Tabs (Meloxicam) .... Take 1 tablet by mouth once a day 8)  Klor-con M20 20 Meq Cr-tabs (Potassium chloride crys cr) .... Take 1 tablet by mouth once a day  Other Orders: T-TSH ([id number]) Tdap => 23yrs IM (01027) Admin 1st Vaccine (25366) Admin 1st Vaccine [facility]) [phone])  Patient Instructions: 1)  CPE   in 2 months. 2)  Tobacco is very bad for your health and your loved ones! You Should stop smoking!. 3)  Stop Smoking Tips: Choose a Quit date. Cut down before the Quit date. decide what you will do as a substitute when you feel the urge to smoke(gum,toothpick,exercise). 4)  It is important that you exercise regularly at least 20 minutes 5 times a week. If you develop chest pain, have severe difficulty breathing, or feel very tired , stop exercising immediately and seek medical attention. 5)  You need to lose weight. Consider a lower calorie diet and regular exercise.  6)  Hepatic Panel prior to visit, ICD-9: 7)  Lipid Panel prior to visit, ICD-9: 8)  BMP prior to visit, ICD-9:   fasting today 9)  TSH prior to visit,  ICD-9: 10)  You will be referred to  Dr. Hilda Lias about your acute knee pain Prescriptions: KLOR-CON M20 20 MEQ CR-TABS (POTASSIUM CHLORIDE CRYS CR) Take 1 tablet by mouth once a day  #30 x 5   Entered and Authorized by:   Syliva Overman MD   Signed by:   Syliva Overman MD on 12/17/2008   Method used:   Electronically to        [facility] [address] [phone]* (retail)       [address] [place], [place]  [address]       Ph: [phone]       Fax: [phone]   RxID:   [id number] MOBIC 15 MG TABS (MELOXICAM) Take 1 tablet by mouth once a day  #30 x 4   Entered and Authorized by:   Syliva Overman MD   Signed by:   Syliva Overman MD on 12/16/2008   Method used:   Electronically to        [facility] [address] [phone]* (retail)       [address] [place], [place]  [address]       Ph: [phone]       Fax: [phone]   RxID:   [id number]     Tetanus/Td Vaccine    Vaccine Type: Tdap    Site: right deltoid    Mfr: boosterix    Dose: 0.5 ml    Route: IM    Given by: Everitt Amber    Exp. Date: 02/24/2011    Lot #: [id number]     Medication Administration  Injection # 1:    Medication: Ketorolac-Toradol 15mg     Diagnosis: KNEE PAIN, RIGHT, ACUTE (ICD-719.46)    Route: IM    Site: RUOQ gluteus    Exp Date: 05/2010    Lot #: [id number]    Mfr: [email or web address]    Comments: 60 mg given    Patient tolerated injection without complications    Given by: Everitt Amber (December 16, 2008 8:57 AM)  Orders Added: 1)  Est. Patient Level IV [99214] 2)  T-Basic Metabolic Panel [[id number]] 3)  T-Hepatic Function [80076-22960] 4)  T-Lipid Profile [80061-22930] 5)  T-TSH [89381-01751] 6)  Tdap => 18yrs IM [90715] 7)  Admin 1st  Vaccine [90471] 8)  Admin 1st Vaccine [facility]) [16109U] 9)  Ketorolac-Toradol 15mg  [J1885] 10)  Admin of Therapeutic Inj  intramuscular or subcutaneous [96372] 11)  Orthopedic Referral [Ortho]

## 2010-05-23 NOTE — Letter (Signed)
Summary: Appointment Reminder  [facility]  [address]   [place], [place] [address]   Phone: [phone]  Fax: [phone]       July 18, 2009   Brooke Maddox [address] [place] [address] [place], [place]  [address] 17-Jul-1970    Dear [name],  We have been unable to reach you by phone to schedule a follow up   appointment that was recommended for you by Dr. Darrick Penna. It is very   important that we reach you to schedule an appointment. We hope that you  allow [place] to participate in your health care needs. Please contact us at  [phone] at your earliest convenience to schedule your appointment.  Sincerely,    [facility] R. Roetta Sessions, M.D.    Jonette Eva, M.D. Lorenza Burton, FNP-BC    Tana Coast, PA-C Phone: [phone]    Fax: [phone]

## 2010-05-23 NOTE — Assessment & Plan Note (Signed)
Summary: NEW PATIENT   Vital Signs:  Patient profile:   40 year old female Menstrual status:  hysterectomy Height:      63 inches (160.02 cm) Weight:      172.25 pounds (78.30 kg) BMI:     30.62 O2 Sat:      95 % Pulse rate:   106 / minute Pulse rhythm:   regular Resp:     16 per minute BP sitting:   130 / 92  (left arm)  Vitals Entered By: Everitt Amber (Sep 13, 2008 2:51 PM)  Nutrition Counseling: Patient's BMI is greater than 25 and therefore counseled on weight management options. CC: Right lymph node strained and also recovering from Bronchitis (going on 2 months) Still coughing up phlegm with a trace of blood, also can feel a knot in left breast thats sore and swollen  years   days  Menstrual Status hysterectomy   CC:  Right lymph node strained and also recovering from Bronchitis (going on 2 months) Still coughing up phlegm with a trace of blood and also can feel a knot in left breast thats sore and swollen.  History of Present Illness: New pt eval. Chronic back pain, needs pain management laso has avascular necrosis of the hip, was evaluated in the past at [facility] treated for chronic bronchitis 2 weeks ago.No antibiotics. Tender swellinf of right neck glands.  Painful lump in R breast x 1 month. she has a past h/o crack, quit but has severe dental caries. Shedenies any recent fever or chills, she has bipolar disease and is [facility],  Preventive Screening-Counseling & Management     Smoking Status: current     Smoking Cessation Counseling: yes     Packs/Day: 0.5      Drug Use:  no.    Problems Prior to Update: 1)  Costochondritis, Left  (ICD-733.6) 2)  Acute Lymphadenitis  (ICD-683) 3)  Back Pain, Chronic  (ICD-724.5) 4)  Essential Hypertension  (ICD-401.9)  Current Medications (verified): 1)  Oxycodone-Acetaminophen 5-325 Mg Tabs (Oxycodone-Acetaminophen) .... Take 1-2 Tabs Every 6 Hours As Needed 2)  Lamotrigine 100 Mg Tabs (Lamotrigine) .... Take 1  Tablet By Mouth Three Times A Day 3)  Mirtazapine 15 Mg Tabs (Mirtazapine) .... Take 1 Tablet By Mouth Once A Day 4)  Alprazolam 1 Mg Tabs (Alprazolam) .... Take One Tab in The Am and Two Tabs The Pm  Allergies (verified): 1)  ! Iodine 2)  ! Depakote 3)  ! Codeine  Past History:  Past Medical History:    Hypertension     Arthritis    Depression/Anxiety, HOSPITALISED FOR MENTAL HEALTH PROBS  2007, Dr Thomasena Edis follows her    Seizures    Migraines    Chronic back pain  Past Surgical History:    Hysterectomy x 2009  Family History:    Mother-living-no chronic conditions    Father-Living, HTN, COPD    Brother- arthritis  Social History:    Occupation-disabled    Single    Current Smoker `1 ppd     Alcohol use-no    Drug use-no quit crack x 11 yeARS    tWO CHILDREN AGES 17 AND 19    Smoking Status:  current    Packs/Day:  0.5    Drug Use:  no  Review of Systems      See HPI General:  Complains of fatigue. ENT:  Complains of sore throat; denies postnasal drainage. CV:  Denies chest pain or discomfort, difficulty  breathing while lying down, near fainting, shortness of breath with exertion, and swelling of feet. Resp:  Denies cough, sputum productive, and wheezing. GI:  Denies abdominal pain, constipation, diarrhea, nausea, and vomiting. GU:  Denies dysuria and urinary frequency. MS:  Complains of joint pain, low back pain, mid back pain, and stiffness. Derm:  See HPI; Complains of lesion(s). Psych:  Complains of anxiety, easily angered, and unusual visions or sounds; denies suicidal thoughts/plans and thoughts of violence. Heme:  Denies abnormal bruising and bleeding.  Physical Exam  General:  alert, well-hydrated, and overweight-appearing.  HEENT: No facial asymmetry,  EOMI, No sinus tenderness, TM's Clear, oropharynx  pink and moist.Extremely poor dentition . R anterior servical adenitis.   Chest: Clear to auscultation bilaterally. tender to palpation over 4th qnd 5ht  Cc junctions on the left. CVS: S1, S2, No murmurs, No S3.   Abd: Soft, Nontender.  MS: decreased ROM spine and hips, adequate shoulders and knees.Abnormal gait  Ext: No edema.   CNS: CN 2-12 intact, power tone and sensation normal throughout.   Skin: Intact, no visible lesions or rashes.  Psych: Good eye contact, normal affect.  Memory intact, not anxious or depressed appearing. Breast: no mass, nipple discharge, inversion or axillary nodes palpated    Impression & Recommendations:  Problem # 1:  ACUTE LYMPHADENITIS (ICD-683) Assessment Comment Only  Her updated medication list for this problem includes:    Penicillin V Potassium 500 Mg Tabs (Penicillin v potassium) .Marland Kitchen... Take 1 tablet by mouth three times a day  Problem # 2:  BACK PAIN, CHRONIC (ICD-724.5) Assessment: Comment Only  Her updated medication list for this problem includes:    Oxycodone-acetaminophen 5-325 Mg Tabs (Oxycodone-acetaminophen) .Marland Kitchen... Take 1-2 tabs every 6 hours as needed    Ibuprofen 800 Mg Tabs (Ibuprofen) .Marland Kitchen... Take 1 tablet by mouth three times a day    Flexeril 10 Mg Tabs (Cyclobenzaprine hcl) .Marland Kitchen... Take 1 tab by mouth at bedtime pain clinic referral  Orders: Neurology Referral (Neuro)  Problem # 3:  ESSENTIAL HYPERTENSION (ICD-401.9) Assessment: Comment Only  BP today: 130/92, single reading , brief review shows normal valuesshe has atenolol prescribed on most recent note in may, but does not havie it with her , she is encouraged to fill the script  Problem # 4:  COSTOCHONDRITIS, LEFT (ICD-733.6) Assessment: Comment Only ibuprofen prescribed x 10days  Complete Medication List: 1)  Oxycodone-acetaminophen 5-325 Mg Tabs (Oxycodone-acetaminophen) .... Take 1-2 tabs every 6 hours as needed 2)  Lamotrigine 100 Mg Tabs (Lamotrigine) .... Take 1 tablet by mouth three times a day 3)  Mirtazapine 15 Mg Tabs (Mirtazapine) .... Take 1 tablet by mouth once a day 4)  Alprazolam 1 Mg Tabs (Alprazolam) ....  Take one tab in the am and two tabs the pm 5)  Penicillin V Potassium 500 Mg Tabs (Penicillin v potassium) .... Take 1 tablet by mouth three times a day 6)  Ibuprofen 800 Mg Tabs (Ibuprofen) .... Take 1 tablet by mouth three times a day 7)  Flexeril 10 Mg Tabs (Cyclobenzaprine hcl) .... Take 1 tab by mouth at bedtime  Other Orders: T-Basic Metabolic Panel [phone]) T-Lipid Profile [phone]) T-CBC w/Diff [phone])  Patient Instructions: 1)  Please schedule a follow-up appointment in 2 months. 2)  Tobacco is very bad for your health and your loved ones! You Should stop smoking!. 3)  Stop Smoking Tips: Choose a Quit date. Cut down before the Quit date. decide what you will do as a substitute when  you feel the urge to smoke(gum,toothpick,exercise). 4)  It is important that you exercise regularly at least 20 minutes 5 times a week. If you develop chest pain, have severe difficulty breathing, or feel very tired , stop exercising immediately and seek medical attention. 5)  You need to lose weight. Consider a lower calorie diet and regular exercise.  6)  BMP prior to visit, ICD-9: 7)  Lipid Panel prior to visit, ICD-9: fasting asap 8)  CBC w/ Diff prior to visit, ICD-9: 9)  You will be referred for chronic pain management.Meds will be sent in as discussed. Prescriptions: FLEXERIL 10 MG TABS (CYCLOBENZAPRINE HCL) Take 1 tab by mouth at bedtime  #30 x 1   Entered and Authorized by:   Syliva Overman MD   Signed by:   Syliva Overman MD on 09/13/2008   Method used:   Electronically to        [facility] [address] [phone]* (retail)       [address] [place], [place]  [address]       Ph: [phone]       Fax: [phone]   RxID:   [id number] IBUPROFEN 800 MG TABS (IBUPROFEN) Take 1 tablet by mouth three times a day  #30 x 0   Entered and Authorized by:   Syliva Overman MD   Signed by:   Syliva Overman MD on 09/13/2008   Method used:   Electronically to         [facility] [address] [phone]* (retail)       [address] [place], [place]  [address]       Ph: [phone]       Fax: [phone]   RxID:   [id number] PENICILLIN V POTASSIUM 500 MG TABS (PENICILLIN V POTASSIUM) Take 1 tablet by mouth three times a day  #30 x 0   Entered and Authorized by:   Syliva Overman MD   Signed by:   Syliva Overman MD on 09/13/2008   Method used:   Printed then faxed to ...         RxID:   [id number]

## 2010-05-23 NOTE — Progress Notes (Signed)
Summary: medicine  Phone Note Call from Patient   Summary of Call: left message  car broke down thats why she was not here  the pharmacy told her they send over her rx for her bp medicine she need them she is completely out call back at [phone] Initial call taken by: Lind Guest,  February 22, 2010 8:00 AM  Follow-up for Phone Call        Rx Called In Follow-up by: Adella Hare LPN,  February 22, 2010 1:11 PM

## 2010-05-23 NOTE — Progress Notes (Signed)
Summary: please advise  Phone Note Call from Patient   Summary of Call: patient called in and states she is sleeping a lot and she thinks this might be coming from her sugars running high.  She said that she talked to [facility] yesterday.  She says she isnt depressed or anything she is just sleeping too much, sleeping more then staying awake.  Please advise. Initial call taken by: Curtis Sites,  March 29, 2010 2:39 PM  Follow-up for Phone Call        pls order HBA1C advise her to do so we cn see if she is diabetic, also order fasting chem 7 with this pls Follow-up by: Syliva Overman MD,  March 29, 2010 5:12 PM  Additional Follow-up for Phone Call Additional follow up Details #1::        labs ordered, patient aware Additional Follow-up by: Adella Hare LPN,  March 30, 2010 11:36 AM

## 2010-05-23 NOTE — Letter (Signed)
Summary: RETURNED [facility]  RETURNED FAX [facility]   Imported By: Lind Guest 11/09/2008 08:54:52  _____________________________________________________________________  External Attachment:    Type:   Image     Comment:   External Document

## 2010-05-23 NOTE — Assessment & Plan Note (Signed)
Summary: NURSE VISIT  Nurse Visit  Comments complaining of burning with urnination and flank pain   Allergies: 1)  ! Iodine 2)  ! Depakote 3)  ! Codeine 4)  Pcn Laboratory Results   Urine Tests  Date/Time Received: 01/27/09 Date/Time Reported: 01/27/09  Routine Urinalysis   Color: yellow Appearance: Clear Glucose: negative   (Normal Range: Negative) Bilirubin: negative   (Normal Range: Negative) Ketone: negative   (Normal Range: Negative) Spec. Gravity: 1.015   (Normal Range: 1.003-1.035) Blood: small   (Normal Range: Negative) pH: 7.0   (Normal Range: 5.0-8.0) Protein: negative   (Normal Range: Negative) Urobilinogen: 0.2   (Normal Range: 0-1) Nitrite: negative   (Normal Range: Negative) Leukocyte Esterace: negative   (Normal Range: Negative)       Orders Added: 1)  UA Dipstick W/ Micro (manual) [81000] 2)  T-Urinalysis [81003-65000] 3)  T-Culture, Urine [[id number]]  Laboratory Results   Urine Tests    Routine Urinalysis   Color: yellow Appearance: Clear Glucose: negative   (Normal Range: Negative) Bilirubin: negative   (Normal Range: Negative) Ketone: negative   (Normal Range: Negative) Spec. Gravity: 1.015   (Normal Range: 1.003-1.035) Blood: small   (Normal Range: Negative) pH: 7.0   (Normal Range: 5.0-8.0) Protein: negative   (Normal Range: Negative) Urobilinogen: 0.2   (Normal Range: 0-1) Nitrite: negative   (Normal Range: Negative) Leukocyte Esterace: negative   (Normal Range: Negative)

## 2010-05-23 NOTE — Letter (Signed)
Summary: MEDICAL RELEASE  MEDICAL RELEASE   Imported By: Lind Guest 11/08/2008 09:47:24  _____________________________________________________________________  External Attachment:    Type:   Image     Comment:   External Document

## 2010-05-23 NOTE — Progress Notes (Signed)
Summary: BP  Phone Note Call from Patient   Summary of Call: CALL HER ABOUT HER BP AND MACHINE SHE HAS BEEN USING CALL  [phone]  Initial call taken by: Lind Guest,  October 19, 2008 1:35 PM  Follow-up for Phone Call        returned call, left message Follow-up by: Worthy Keeler LPN,  October 19, 2008 2:09 PM  Additional Follow-up for Phone Call Additional follow up Details #1::        Headache still a 10 on a scale of 1-10. Already took 2 Ibuprofen. Had 2 shots at the hospital that made her feel funny, made her loopy. (benadryl and Reglan IM) Could these have interacted with her meds? Feeling better from it now. Doesn't know what they were. Started taking the potassium 1 once daily. Will go thursday to have it rechecked. Additional Follow-up by: Everitt Amber,  October 19, 2008 3:27 PM  New Problems: HYPOKALEMIA (ICD-276.8)   Additional Follow-up for Phone Call Additional follow up Details #2::    agree qwith getting apotassium check since it waslow, i do not think there was a significant med interaction, if she continues to have a headache when you call back, let her know i will refer her to DR tESFAYE A NEUROLOGIST IN [place] ABOUT THIS, LET MEKNOW IF SHE IS INTERESTED IN THE REFERRAL Follow-up by: Syliva Overman MD,  October 19, 2008 6:15 PM  Additional Follow-up for Phone Call Additional follow up Details #3:: Details for Additional Follow-up Action Taken: She will call back if interested in referral, going to get pot. checked tomorrow Additional Follow-up by: Everitt Amber,  October 20, 2008 10:56 AM  New Problems: HYPOKALEMIA (ICD-276.8)

## 2010-05-25 NOTE — Progress Notes (Signed)
Summary: test results  Phone Note Call from Patient   Summary of Call: wants test results on her thyroids or something  call back and let her know what is going on [phone] Initial call taken by: Lind Guest,  April 11, 2010 1:15 PM  Follow-up for Phone Call        PATIENT AWARE Follow-up by: Adella Hare LPN,  April 11, 2010 6:21 PM

## 2010-05-25 NOTE — Letter (Signed)
Summary: DERMATOLOGY  DERMATOLOGY   Imported By: Lind Guest 05/18/2010 11:22:26  _____________________________________________________________________  External Attachment:    Type:   Image     Comment:   External Document

## 2010-05-25 NOTE — Progress Notes (Signed)
Summary: BLOOD PRESSURE MEDICINE   Phone Note Call from Patient   Summary of Call: THE PHARMACY IS GOING TO FAX OVER HERE BLOOD PRESSURE MEDICINE AND SHE HAS 1 PILL LEFT SHE NEEDS THIS MEDICINE TODAY SO PLEASE SEND OVER TO HER PHARMACY TODAY Initial call taken by: Lind Guest,  May 05, 2010 11:18 AM  Follow-up for Phone Call        medicine was faxed over this morning.  Follow-up by: Everitt Amber LPN,  May 05, 2010 2:45 PM

## 2010-05-25 NOTE — Letter (Signed)
Summary: LAB ADD ON  LAB ADD ON   Imported By: Lind Guest 04/04/2010 09:04:42  _____________________________________________________________________  External Attachment:    Type:   Image     Comment:   External Document

## 2010-05-25 NOTE — Letter (Signed)
Summary: dermatology  dermatology   Imported By: Lind Guest 05/01/2010 12:57:13  _____________________________________________________________________  External Attachment:    Type:   Image     Comment:   External Document

## 2010-05-25 NOTE — Miscellaneous (Signed)
  Clinical Lists Changes  Problems: Added new problem of ABNORMAL THYROID FUNCTION TESTS (ICD-794.5) Orders: Added new Referral order of Radiology Referral (Radiology) - Signed

## 2010-05-25 NOTE — Progress Notes (Signed)
  Phone Note Call from Patient   Caller: Patient Summary of Call: patient states we told her her thyroid was normal but states somthing is going on, she is very irritable, not growing any hair anywhere, has no libido Initial call taken by: Adella Hare LPN,  April 25, 2010 2:20 PM  Follow-up for Phone Call        the gland structure , the [place] is normal, but the bloodwork suggests the gland is mildly underactive, she will benefit from thyroid meds which I will send in pls let know, med has been sent in Follow-up by: Syliva Overman MD,  May 01, 2010 12:38 PM  Additional Follow-up for Phone Call Additional follow up Details #1::        Phone Call Completed Additional Follow-up by: Adella Hare LPN,  May 01, 2010 1:34 PM    New/Updated Medications: SYNTHROID 25 MCG TABS (LEVOTHYROXINE SODIUM) Take 1 tablet by mouth once a day Prescriptions: SYNTHROID 25 MCG TABS (LEVOTHYROXINE SODIUM) Take 1 tablet by mouth once a day  #30 x 2   Entered and Authorized by:   Syliva Overman MD   Signed by:   Syliva Overman MD on 05/01/2010   Method used:   Electronically to        [facility] [address] [phone]* (retail)       [address] [place]  [address]       Ph: [phone]       Fax: [phone]   RxID:   [id number]

## 2010-05-25 NOTE — Progress Notes (Signed)
Summary: medicine  Phone Note Call from Patient   Summary of Call: pt needs to speak with with nurse about hormone medicine. messing with her eyes. [phone] Initial call taken by: Rudene Anda,  May 19, 2010 1:08 PM  Follow-up for Phone Call        complains of dizziness and visual trouble, advised does not sound like a normal side effect from meds, advised if continues to go to er for eval  patient agrees Follow-up by: Adella Hare LPN,  May 19, 2010 2:04 PM

## 2010-05-29 ENCOUNTER — Telehealth: Payer: Self-pay | Admitting: Family Medicine

## 2010-06-08 ENCOUNTER — Encounter: Payer: Self-pay | Admitting: Family Medicine

## 2010-06-08 ENCOUNTER — Ambulatory Visit (INDEPENDENT_AMBULATORY_CARE_PROVIDER_SITE_OTHER): Payer: Medicare Other | Admitting: Family Medicine

## 2010-06-08 DIAGNOSIS — K219 Gastro-esophageal reflux disease without esophagitis: Secondary | ICD-10-CM

## 2010-06-08 DIAGNOSIS — R946 Abnormal results of thyroid function studies: Secondary | ICD-10-CM

## 2010-06-08 DIAGNOSIS — M549 Dorsalgia, unspecified: Secondary | ICD-10-CM

## 2010-06-08 DIAGNOSIS — R7301 Impaired fasting glucose: Secondary | ICD-10-CM | POA: Insufficient documentation

## 2010-06-08 DIAGNOSIS — I10 Essential (primary) hypertension: Secondary | ICD-10-CM

## 2010-06-08 NOTE — Progress Notes (Signed)
Summary: Nail in hand  Phone Note Call from Patient   Summary of Call: pt had a nail to go in hand. she has been cleaning it. has been awhile since she had tentus shot. its red today [phone] Initial call taken by: Rudene Anda,  May 29, 2010 1:23 PM  Follow-up for Phone Call        advised patient go to the urgent care and that her tdap was up to date Follow-up by: Everitt Amber LPN,  May 29, 2010 2:00 PM

## 2010-06-20 NOTE — Assessment & Plan Note (Signed)
Summary: office visit   Vital Signs:  Patient profile:   40 year old female Menstrual status:  hysterectomy Height:      63 inches Weight:      157 pounds BMI:     27.91 O2 Sat:      97 % Pulse rate:   74 / minute Pulse rhythm:   regular Resp:     16 per minute BP sitting:   128 / 92  (left arm) Cuff size:   regular  Vitals Entered By: Everitt Amber LPN (June 08, 2010 3:17 PM)  Nutrition Counseling: Patient's BMI is greater than 25 and therefore counseled on weight management options. CC: c/o having to walk everywhere she goes and is having pain in her feets and legs and the muscle relaxer doesn't help her pain and she took a low dose of percocet and it helped alot. Wants to know if she can get a stronger pain pill   Primary Care Provider:  Dr. Berenice Primas  CC:  c/o having to walk everywhere she goes and is having pain in her feets and legs and the muscle relaxer doesn't help her pain and she took a low dose of percocet and it helped alot. Wants to know if she can get a stronger pain pill.  History of Present Illness: c/o uncontrolled body pain which is aggravated by walking , states she was prescribed percocet in the ed and wants to know f this can be continued, since "it helped alot". I advised that as far as chrolnic arthritic pain is concerned I would not preascribe such a strong med, but would prescribe a muscle relaxant for her back spasms. She continues to smoke with no quit date set.  Denies recent fever or chills. Denies sinus pressure, nasal congestion , ear pain or sore throat. Denies chest congestion, or cough productive of sputum. Denies chest pain, palpitations, PND, orthopnea or leg swelling. Denies abdominal pain, nausea, vomitting, diarrhea or constipation. Denies change in bowel movements or bloody stool. Denies dysuria , frequency, incontinence or hesitancy.  Denies headaches, vertigo, seizures. Denies uncontrolled depression, anxiety or insomnia. Denies   rash, lesions, or itch.She has had some ski tages removed.     Preventive Screening-Counseling & Management  Alcohol-Tobacco     Smoking Cessation Counseling: yes  Current Medications (verified): 1)  Lamictal 150 Mg Tabs (Lamotrigine) .... Take 1 Tablet By Mouth Two Times A Day 2)  Diazepam 5 Mg Tabs (Diazepam) .... Take 1 Tablet By Mouth Three Times A Day 3)  Maxzide-25 37.5-25 Mg Tabs (Triamterene-Hctz) .... Take One and A Half Tablet Daily 4)  Simvastatin 20 Mg Tabs (Simvastatin) .... One Tab By Mouth Qhs 5)  Spiriva Handihaler 18 Mcg Caps (Tiotropium Bromide Monohydrate) .... One Inhalation Daily 6)  Prozac 20 Mg Caps (Fluoxetine Hcl) .... One Cap By Mouth Once Daily 7)  Thiothixene 10 Mg Caps (Thiothixene) .... One Cap By Mouth Qhs 8)  Methocarbamol 500 Mg Tabs (Methocarbamol) .... Take 2 Tabs Every 6 Hrs As Needed For Muscle Spasm 9)  Doxepin Hcl 10 Mg Caps (Doxepin Hcl) .... Take 1 Tab By Mouth At Bedtime 10)  Carbamazepine 200 Mg Tabs (Carbamazepine) .... Take 1 Tablet By Mouth Two Times A Day 11)  Potassium Chloride 20 Meq/74ml (10%) Liqd (Potassium Chloride) .... 30 Cc Three Times Daily 12)  Synthroid 25 Mcg Tabs (Levothyroxine Sodium) .... Take 1 Tablet By Mouth Once A Day  Allergies (verified): 1)  ! Iodine 2)  ! Depakote 3)  !  Codeine 4)  ! Lithium Carbonate (Lithium Carbonate) 5)  Pcn  Review of Systems      See HPI General:  Complains of fatigue. Eyes:  Denies discharge and red eye. Resp:  Complains of cough, shortness of breath, and wheezing; denies sputum productive. MS:  Complains of joint pain, low back pain, muscle aches, muscle weakness, and stiffness; right hip pain chronic, c/o lower ext pain, states she is walking more than in the past. Psych:  Complains of mental problems. Endo:  Denies cold intolerance, excessive hunger, excessive thirst, excessive urination, and heat intolerance. Heme:  Denies abnormal bruising and bleeding. Allergy:  Denies hives or  rash and itching eyes.  Physical Exam  General:  Well-developed,well-nourished,in no acute distress; alert,appropriate and cooperative throughout examination HEENT: No facial asymmetry,  EOMI, No sinus tenderness, , oropharynx  pink and moist. poor dentition  Chest: Clear to auscultation bilaterally.Reduced air entry throughout CVS: S1, S2, No murmurs, No S3.   Abd: Soft, Nontender.obese  MS: decreased ROM thoracolmbar spine, hips, shoulders and knees.  Ext: No edema.   CNS: CN 2-12 intact, power tone and sensation normal throughout.   Skin: Intact, Psych: Good eye contact, normal affect.  Memory intact, not anxious or depressed appearing.    Impression & Recommendations:  Problem # 1:  IMPAIRED FASTING GLUCOSE (ICD-790.21) Assessment Comment Only  Orders: T- Hemoglobin A1C ([id number]) Pt advised to reduce carbohydrate intake, espescially sweets, and to start regular physical activity, at least 30 minutes 5 days weekly, to enable weight loss, and reduce the risk of becoming diabetic   Problem # 2:  COPD (ICD-496) Assessment: Deteriorated  Her updated medication list for this problem includes:    Spiriva Handihaler 18 Mcg Caps (Tiotropium bromide monohydrate) ..... One inhalation daily  Pulmonary Functions Reviewed: O2 sat: 97 (06/08/2010)     Vaccines Reviewed: Flu Vax: Historical (12/30/2008)  Problem # 3:  NICOTINE ADDICTION (ICD-305.1) Assessment: Unchanged  Encouraged smoking cessation and discussed different methods for smoking cessation.   Problem # 4:  ESSENTIAL HYPERTENSION (ICD-401.9) Assessment: Deteriorated  Her updated medication list for this problem includes:    Maxzide-25 37.5-25 Mg Tabs (Triamterene-hctz) .Marland Kitchen... Take one and a half tablet daily  Orders: T-Basic Metabolic Panel [id number]) Medicare Electronic Prescription [phone])  BP today: 128/92 Prior BP: 110/82 (03/15/2010)  Labs Reviewed: K+: 3.4 (03/30/2010) Creat: : 0.58 (03/30/2010)    Chol: 186 (03/30/2010)   HDL: 42 (03/30/2010)   LDL: 116 (03/30/2010)   TG: 141 (03/30/2010)  Problem # 5:  BACK PAIN, CHRONIC (ICD-724.5) Assessment: Deteriorated  The following medications were removed from the medication list:    Methocarbamol 500 Mg Tabs (Methocarbamol) .Marland Kitchen... Take 2 tabs every 6 hrs as needed for muscle spasm Her updated medication list for this problem includes:    Tizanidine Hcl 4 Mg Tabs (Tizanidine hcl) .Marland Kitchen... Take 1 tab by mouth at bedtime  Orders: Depo- Medrol 80mg  (J1040) Ketorolac-Toradol 15mg  (H8469) Admin of Therapeutic Inj  intramuscular or subcutaneous (62952) Medicare Electronic Prescription (W4132)  Complete Medication List: 1)  Lamictal 150 Mg Tabs (Lamotrigine) .... Take 1 tablet by mouth two times a day 2)  Diazepam 5 Mg Tabs (Diazepam) .... Take 1 tablet by mouth three times a day 3)  Maxzide-25 37.5-25 Mg Tabs (Triamterene-hctz) .... Take one and a half tablet daily 4)  Simvastatin 20 Mg Tabs (Simvastatin) .... One tab by mouth qhs 5)  Spiriva Handihaler 18 Mcg Caps (Tiotropium bromide monohydrate) .... One inhalation daily 6)  Prozac 20  Mg Caps (Fluoxetine hcl) .... One cap by mouth once daily 7)  Thiothixene 10 Mg Caps (Thiothixene) .... One cap by mouth qhs 8)  Doxepin Hcl 10 Mg Caps (Doxepin hcl) .... Take 1 tab by mouth at bedtime 9)  Carbamazepine 200 Mg Tabs (Carbamazepine) .... Take 1 tablet by mouth two times a day 10)  Potassium Chloride 20 Meq/16ml (10%) Liqd (Potassium chloride) .... 30 cc three times daily 11)  Synthroid 25 Mcg Tabs (Levothyroxine sodium) .... Take 1 tablet by mouth once a day 12)  Tizanidine Hcl 4 Mg Tabs (Tizanidine hcl) .... Take 1 tab by mouth at bedtime  Other Orders: T-Hepatic Function [phone]) T-Lipid Profile [phone]) T-TSH [phone])  Patient Instructions: 1)  Please schedule a follow-up appointment in 3 months. 2)  It is important that you exercise regularly at least 20 minutes 5 times a  week. If you develop chest pain, have severe difficulty breathing, or feel very tired , stop exercising immediately and seek medical attention. 3)  You need to lose weight. Consider a lower calorie diet and regular exercise.  4)  BMP prior to visit, ICD-9: 5)  Hepatic Panel prior to visit, ICD-9: 6)  Lipid Panel prior to visit, ICD-9:  fasting April 8 or after. 7)  TSH prior to visit, ICD-9: 8)  HbgA1C prior to visit, ICD-9: 9)  Tobacco is very bad for your health and your loved ones! You Should stop smoking!. 10)  Stop Smoking Tips: Choose a Quit date. Cut down before the Quit date. decide what you will do as a substitute when you feel the urge to smoke(gum,toothpick,exercise).Current half PPD 11)  New muscle relaxer sent in forbedtime use only Prescriptions: MAXZIDE-25 37.5-25 MG TABS (TRIAMTERENE-HCTZ) Take one and a half tablet daily  #45 Tablet x 4   Entered by:   Everitt Amber LPN   Authorized by:   Syliva Overman MD   Signed by:   Everitt Amber LPN on 57/84/6962   Method used:   Electronically to        [facility] [address] [phone]* (retail)       [address] [place], [place]  [address]       Ph: [phone]       Fax: [phone]   RxID:   [id number] TIZANIDINE HCL 4 MG TABS (TIZANIDINE HCL) Take 1 tab by mouth at bedtime  #30 x 1   Entered and Authorized by:   Syliva Overman MD   Signed by:   Syliva Overman MD on 06/08/2010   Method used:   Electronically to        [facility] [address] [phone]* (retail)       [address] [place], [place]  [address]       Ph: [phone]       Fax: [phone]   RxID:   [id number]    Medication Administration  Injection # 1:    Medication: Depo- Medrol 80mg     Diagnosis: BACK PAIN, CHRONIC (ICD-724.5)    Route: IM    Site: RUOQ gluteus    Exp Date: 10/2010    Lot #: Gunnar Bulla     Mfr: Pharmacia    Comments: 80mg  given     Patient tolerated injection without complications    Given by: Everitt Amber LPN  (June 08, 2010 4:43 PM)  Injection # 2:    Medication: Ketorolac-Toradol 15mg     Diagnosis: BACK PAIN,  CHRONIC (ICD-724.5)    Route: IM    Site: RUOQ gluteus    Exp Date: 09/2011    Lot #: [id number]     Mfr: [email or web address]    Comments: 60mg  given     Patient tolerated injection without complications    Given by: Everitt Amber LPN (June 08, 2010 4:44 PM)  Orders Added: 1)  Est. Patient Level IV [99214] 2)  T-Basic Metabolic Panel [80048-22910] 3)  T-Hepatic Function [80076-22960] 4)  T-Lipid Profile [80061-22930] 5)  T- Hemoglobin A1C [83036-23375] 6)  T-TSH [81191-47829] 7)  Depo- Medrol 80mg  [J1040] 8)  Ketorolac-Toradol 15mg  [J1885] 9)  Admin of Therapeutic Inj  intramuscular or subcutaneous [96372] 10)  Medicare Electronic Prescription [G8553]     Medication Administration  Injection # 1:    Medication: Depo- Medrol 80mg     Diagnosis: BACK PAIN, CHRONIC (ICD-724.5)    Route: IM    Site: RUOQ gluteus    Exp Date: 10/2010    Lot #: Gunnar Bulla     Mfr: Pharmacia    Comments: 80mg  given     Patient tolerated injection without complications    Given by: Everitt Amber LPN (June 08, 2010 4:43 PM)  Injection # 2:    Medication: Ketorolac-Toradol 15mg     Diagnosis: BACK PAIN, CHRONIC (ICD-724.5)    Route: IM    Site: RUOQ gluteus    Exp Date: 09/2011    Lot #: [id number]     Mfr: [email or web address]    Comments: 60mg  given     Patient tolerated injection without complications    Given by: Everitt Amber LPN (June 08, 2010 4:44 PM)  Orders Added: 1)  Est. Patient Level IV [56213] 2)  T-Basic Metabolic Panel [08657-84696] 3)  T-Hepatic Function [80076-22960] 4)  T-Lipid Profile [80061-22930] 5)  T- Hemoglobin A1C [83036-23375] 6)  T-TSH [29528-41324] 7)  Depo- Medrol 80mg  [J1040] 8)  Ketorolac-Toradol 15mg  [J1885] 9)  Admin of Therapeutic Inj  intramuscular or subcutaneous [96372] 10)  Medicare Electronic Prescription [M0102]

## 2010-07-11 LAB — BASIC METABOLIC PANEL
BUN: 7 mg/dL (ref 6–23)
BUN: 7 mg/dL (ref 6–23)
CO2: 30 mEq/L (ref 19–32)
CO2: 31 mEq/L (ref 19–32)
Calcium: 9.1 mg/dL (ref 8.4–10.5)
Calcium: 9.9 mg/dL (ref 8.4–10.5)
Chloride: 94 mEq/L — ABNORMAL LOW (ref 96–112)
Chloride: 96 mEq/L (ref 96–112)
Creatinine, Ser: 0.73 mg/dL (ref 0.4–1.2)
Creatinine, Ser: 0.87 mg/dL (ref 0.4–1.2)
GFR calc Af Amer: >60 mL/min (ref >60)
GFR calc Af Amer: >60 mL/min (ref >60)
GFR calc non Af Amer: >60 mL/min (ref >60)
GFR calc non Af Amer: >60 mL/min (ref >60)
Glucose, Bld: 115 mg/dL — ABNORMAL HIGH (ref 70–99)
Glucose, Bld: 116 mg/dL — ABNORMAL HIGH (ref 70–99)
Potassium: 2.8 mEq/L — ABNORMAL LOW (ref 3.5–5.1)
Potassium: 3.1 mEq/L — ABNORMAL LOW (ref 3.5–5.1)
Sodium: 132 mEq/L — ABNORMAL LOW (ref 135–145)
Sodium: 135 mEq/L (ref 135–145)

## 2010-07-11 LAB — CBC
HCT: 39.8 % (ref 36.0–46.0)
Hemoglobin: 14.2 g/dL (ref 12.0–15.0)
MCHC: 35.8 g/dL (ref 30.0–36.0)
MCV: 93.5 fL (ref 78.0–100.0)
Platelets: 363 10*3/uL (ref 150–400)
RBC: 4.25 MIL/uL (ref 3.87–5.11)
RDW: 13.1 % (ref 11.5–15.5)
WBC: 12.6 10*3/uL — ABNORMAL HIGH (ref 4.0–10.5)

## 2010-07-11 LAB — RAPID URINE DRUG SCREEN, HOSP PERFORMED
Amphetamines: NOT DETECTED
Barbiturates: NOT DETECTED
Benzodiazepines: POSITIVE — AB
Cocaine: NOT DETECTED
Opiates: NOT DETECTED
Tetrahydrocannabinol: POSITIVE — AB

## 2010-07-11 LAB — ETHANOL: Alcohol, Ethyl (B): <5 mg/dL (ref 0–10)

## 2010-07-11 LAB — URINALYSIS, ROUTINE W REFLEX MICROSCOPIC
Bilirubin Urine: NEGATIVE
Glucose, UA: NEGATIVE mg/dL
Ketones, ur: NEGATIVE mg/dL
Leukocytes, UA: NEGATIVE
Nitrite: NEGATIVE
Protein, ur: NEGATIVE mg/dL
Specific Gravity, Urine: 1.015 (ref 1.005–1.030)
Urobilinogen, UA: 0.2 mg/dL (ref 0.0–1.0)
pH: 7.5 (ref 5.0–8.0)

## 2010-07-11 LAB — DIFFERENTIAL
Basophils Absolute: 0.1 10*3/uL (ref 0.0–0.1)
Basophils Relative: 1 % (ref 0–1)
Eosinophils Absolute: 0.4 10*3/uL (ref 0.0–0.7)
Eosinophils Relative: 3 % (ref 0–5)
Lymphocytes Relative: 35 % (ref 12–46)
Lymphs Abs: 4.4 10*3/uL — ABNORMAL HIGH (ref 0.7–4.0)
Monocytes Absolute: 0.6 10*3/uL (ref 0.1–1.0)
Monocytes Relative: 5 % (ref 3–12)
Neutro Abs: 7.2 10*3/uL (ref 1.7–7.7)
Neutrophils Relative %: 57 % (ref 43–77)

## 2010-07-11 LAB — URINE MICROSCOPIC-ADD ON

## 2010-07-12 ENCOUNTER — Telehealth: Payer: Self-pay | Admitting: Family Medicine

## 2010-07-12 NOTE — Telephone Encounter (Signed)
Order sent as requested

## 2010-07-14 ENCOUNTER — Ambulatory Visit: Payer: Self-pay | Admitting: Family Medicine

## 2010-07-16 LAB — BASIC METABOLIC PANEL
BUN: 4 mg/dL — ABNORMAL LOW (ref 6–23)
CO2: 32 mEq/L (ref 19–32)
Calcium: 9.4 mg/dL (ref 8.4–10.5)
Chloride: 93 mEq/L — ABNORMAL LOW (ref 96–112)
Creatinine, Ser: 0.79 mg/dL (ref 0.4–1.2)
GFR calc Af Amer: >60 mL/min (ref >60)
GFR calc non Af Amer: >60 mL/min (ref >60)
Glucose, Bld: 126 mg/dL — ABNORMAL HIGH (ref 70–99)
Potassium: 2.9 mEq/L — ABNORMAL LOW (ref 3.5–5.1)
Sodium: 135 mEq/L (ref 135–145)

## 2010-07-16 LAB — DIFFERENTIAL
Basophils Absolute: 0.1 10*3/uL (ref 0.0–0.1)
Basophils Relative: 1 % (ref 0–1)
Eosinophils Absolute: 0.3 10*3/uL (ref 0.0–0.7)
Eosinophils Relative: 2 % (ref 0–5)
Lymphocytes Relative: 18 % (ref 12–46)
Lymphs Abs: 2.5 10*3/uL (ref 0.7–4.0)
Monocytes Absolute: 0.7 10*3/uL (ref 0.1–1.0)
Monocytes Relative: 5 % (ref 3–12)
Neutro Abs: 10.2 10*3/uL — ABNORMAL HIGH (ref 1.7–7.7)
Neutrophils Relative %: 74 % (ref 43–77)

## 2010-07-16 LAB — CBC
HCT: 36.9 % (ref 36.0–46.0)
Hemoglobin: 13.1 g/dL (ref 12.0–15.0)
MCHC: 35.5 g/dL (ref 30.0–36.0)
MCV: 92.7 fL (ref 78.0–100.0)
Platelets: 316 10*3/uL (ref 150–400)
RBC: 3.98 MIL/uL (ref 3.87–5.11)
RDW: 12.6 % (ref 11.5–15.5)
WBC: 13.8 10*3/uL — ABNORMAL HIGH (ref 4.0–10.5)

## 2010-07-16 LAB — URINE MICROSCOPIC-ADD ON

## 2010-07-16 LAB — URINALYSIS, ROUTINE W REFLEX MICROSCOPIC
Bilirubin Urine: NEGATIVE
Glucose, UA: NEGATIVE mg/dL
Ketones, ur: NEGATIVE mg/dL
Leukocytes, UA: NEGATIVE
Nitrite: NEGATIVE
Protein, ur: NEGATIVE mg/dL
Specific Gravity, Urine: 1.015 (ref 1.005–1.030)
Urobilinogen, UA: 0.2 mg/dL (ref 0.0–1.0)
pH: 6 (ref 5.0–8.0)

## 2010-07-16 LAB — PREGNANCY, URINE: Preg Test, Ur: NEGATIVE

## 2010-07-24 LAB — BASIC METABOLIC PANEL
BUN: 8 mg/dL (ref 6–23)
CO2: 28 mEq/L (ref 19–32)
Calcium: 9.6 mg/dL (ref 8.4–10.5)
Chloride: 94 mEq/L — ABNORMAL LOW (ref 96–112)
Creatinine, Ser: 0.89 mg/dL (ref 0.4–1.2)
GFR calc Af Amer: >60 mL/min (ref >60)
GFR calc non Af Amer: >60 mL/min (ref >60)
Glucose, Bld: 117 mg/dL — ABNORMAL HIGH (ref 70–99)
Potassium: 3 mEq/L — ABNORMAL LOW (ref 3.5–5.1)
Sodium: 133 mEq/L — ABNORMAL LOW (ref 135–145)

## 2010-07-25 LAB — COMPREHENSIVE METABOLIC PANEL
ALT: 9 U/L (ref 0–35)
AST: 16 U/L (ref 0–37)
Albumin: 4.3 g/dL (ref 3.5–5.2)
Alkaline Phosphatase: 114 U/L (ref 39–117)
BUN: 6 mg/dL (ref 6–23)
CO2: 27 mEq/L (ref 19–32)
Calcium: 9.7 mg/dL (ref 8.4–10.5)
Chloride: 97 mEq/L (ref 96–112)
Creatinine, Ser: 0.78 mg/dL (ref 0.4–1.2)
GFR calc Af Amer: >60 mL/min (ref >60)
GFR calc non Af Amer: >60 mL/min (ref >60)
Glucose, Bld: 106 mg/dL — ABNORMAL HIGH (ref 70–99)
Potassium: 3.2 mEq/L — ABNORMAL LOW (ref 3.5–5.1)
Sodium: 134 mEq/L — ABNORMAL LOW (ref 135–145)
Total Bilirubin: 0.4 mg/dL (ref 0.3–1.2)
Total Protein: 7.3 g/dL (ref 6.0–8.3)

## 2010-07-25 LAB — CBC
HCT: 42 % (ref 36.0–46.0)
Hemoglobin: 14.2 g/dL (ref 12.0–15.0)
MCHC: 33.8 g/dL (ref 30.0–36.0)
MCV: 94.2 fL (ref 78.0–100.0)
Platelets: 425 10*3/uL — ABNORMAL HIGH (ref 150–400)
RBC: 4.46 MIL/uL (ref 3.87–5.11)
RDW: 13.3 % (ref 11.5–15.5)
WBC: 11.9 10*3/uL — ABNORMAL HIGH (ref 4.0–10.5)

## 2010-07-25 LAB — URINE MICROSCOPIC-ADD ON

## 2010-07-25 LAB — URINALYSIS, ROUTINE W REFLEX MICROSCOPIC
Bilirubin Urine: NEGATIVE
Glucose, UA: NEGATIVE mg/dL
Ketones, ur: NEGATIVE mg/dL
Leukocytes, UA: NEGATIVE
Nitrite: NEGATIVE
Protein, ur: NEGATIVE mg/dL
Specific Gravity, Urine: 1.01 (ref 1.005–1.030)
Urobilinogen, UA: 0.2 mg/dL (ref 0.0–1.0)
pH: 7 (ref 5.0–8.0)

## 2010-07-25 LAB — DIFFERENTIAL
Basophils Absolute: 0.1 10*3/uL (ref 0.0–0.1)
Basophils Relative: 1 % (ref 0–1)
Eosinophils Absolute: 0.3 10*3/uL (ref 0.0–0.7)
Eosinophils Relative: 2 % (ref 0–5)
Lymphocytes Relative: 36 % (ref 12–46)
Lymphs Abs: 4.2 10*3/uL — ABNORMAL HIGH (ref 0.7–4.0)
Monocytes Absolute: 0.8 10*3/uL (ref 0.1–1.0)
Monocytes Relative: 7 % (ref 3–12)
Neutro Abs: 6.4 10*3/uL (ref 1.7–7.7)
Neutrophils Relative %: 54 % (ref 43–77)

## 2010-07-25 LAB — LIPASE, BLOOD: Lipase: 19 U/L (ref 11–59)

## 2010-07-26 LAB — URINALYSIS, ROUTINE W REFLEX MICROSCOPIC
Bilirubin Urine: NEGATIVE
Bilirubin Urine: NEGATIVE
Glucose, UA: NEGATIVE mg/dL
Glucose, UA: NEGATIVE mg/dL
Hgb urine dipstick: NEGATIVE
Ketones, ur: NEGATIVE mg/dL
Ketones, ur: NEGATIVE mg/dL
Leukocytes, UA: NEGATIVE
Nitrite: NEGATIVE
Nitrite: NEGATIVE
Protein, ur: NEGATIVE mg/dL
Protein, ur: NEGATIVE mg/dL
Specific Gravity, Urine: <1.005 — ABNORMAL LOW (ref 1.005–1.030)
Specific Gravity, Urine: 1.01 (ref 1.005–1.030)
Urobilinogen, UA: 0.2 mg/dL (ref 0.0–1.0)
Urobilinogen, UA: 0.2 mg/dL (ref 0.0–1.0)
pH: 6 (ref 5.0–8.0)
pH: 6 (ref 5.0–8.0)

## 2010-07-26 LAB — URINE MICROSCOPIC-ADD ON

## 2010-07-26 LAB — DIFFERENTIAL
Basophils Absolute: 0.1 K/uL (ref 0.0–0.1)
Basophils Absolute: 0.2 K/uL — ABNORMAL HIGH (ref 0.0–0.1)
Basophils Relative: 1 % (ref 0–1)
Basophils Relative: 1 % (ref 0–1)
Eosinophils Absolute: 0.5 K/uL (ref 0.0–0.7)
Eosinophils Absolute: 0.2 K/uL (ref 0.0–0.7)
Eosinophils Relative: 3 % (ref 0–5)
Eosinophils Relative: 2 % (ref 0–5)
Lymphocytes Relative: 27 % (ref 12–46)
Lymphocytes Relative: 41 % (ref 12–46)
Lymphs Abs: 4.1 K/uL — ABNORMAL HIGH (ref 0.7–4.0)
Lymphs Abs: 6.2 K/uL — ABNORMAL HIGH (ref 0.7–4.0)
Monocytes Absolute: 0.8 K/uL (ref 0.1–1.0)
Monocytes Absolute: 0.3 K/uL (ref 0.1–1.0)
Monocytes Relative: 5 % (ref 3–12)
Monocytes Relative: 2 % — ABNORMAL LOW (ref 3–12)
Neutro Abs: 9.7 K/uL — ABNORMAL HIGH (ref 1.7–7.7)
Neutro Abs: 8.1 K/uL — ABNORMAL HIGH (ref 1.7–7.7)
Neutrophils Relative %: 64 % (ref 43–77)
Neutrophils Relative %: 54 % (ref 43–77)

## 2010-07-26 LAB — BASIC METABOLIC PANEL
BUN: 5 mg/dL — ABNORMAL LOW (ref 6–23)
CO2: 31 mEq/L (ref 19–32)
Calcium: 9.5 mg/dL (ref 8.4–10.5)
Chloride: 98 mEq/L (ref 96–112)
Creatinine, Ser: 0.77 mg/dL (ref 0.4–1.2)
GFR calc Af Amer: >60 mL/min (ref >60)
GFR calc non Af Amer: >60 mL/min (ref >60)
Glucose, Bld: 118 mg/dL — ABNORMAL HIGH (ref 70–99)
Potassium: 3 mEq/L — ABNORMAL LOW (ref 3.5–5.1)
Sodium: 137 mEq/L (ref 135–145)

## 2010-07-26 LAB — BASIC METABOLIC PANEL WITH GFR
BUN: 7 mg/dL (ref 6–23)
CO2: 27 meq/L (ref 19–32)
Calcium: 10.1 mg/dL (ref 8.4–10.5)
Chloride: 97 meq/L (ref 96–112)
Creatinine, Ser: 0.85 mg/dL (ref 0.4–1.2)
GFR calc non Af Amer: >60 mL/min
Glucose, Bld: 102 mg/dL — ABNORMAL HIGH (ref 70–99)
Potassium: 2.9 meq/L — ABNORMAL LOW (ref 3.5–5.1)
Sodium: 135 meq/L (ref 135–145)

## 2010-07-26 LAB — CBC
HCT: 40.9 % (ref 36.0–46.0)
HCT: 42 % (ref 36.0–46.0)
Hemoglobin: 14.3 g/dL (ref 12.0–15.0)
Hemoglobin: 14.7 g/dL (ref 12.0–15.0)
MCHC: 35 g/dL (ref 30.0–36.0)
MCHC: 35.1 g/dL (ref 30.0–36.0)
MCV: 93.5 fL (ref 78.0–100.0)
MCV: 93.9 fL (ref 78.0–100.0)
Platelets: 372 K/uL (ref 150–400)
Platelets: 406 10*3/uL — ABNORMAL HIGH (ref 150–400)
RBC: 4.37 MIL/uL (ref 3.87–5.11)
RBC: 4.47 MIL/uL (ref 3.87–5.11)
RDW: 12.8 % (ref 11.5–15.5)
RDW: 12.6 % (ref 11.5–15.5)
WBC: 15.2 K/uL — ABNORMAL HIGH (ref 4.0–10.5)
WBC: 15 10*3/uL — ABNORMAL HIGH (ref 4.0–10.5)

## 2010-07-26 LAB — HEPATIC FUNCTION PANEL
ALT: 14 U/L (ref 0–35)
AST: 22 U/L (ref 0–37)
Albumin: 3.9 g/dL (ref 3.5–5.2)
Alkaline Phosphatase: 107 U/L (ref 39–117)
Bilirubin, Direct: 0.1 mg/dL (ref 0.0–0.3)
Indirect Bilirubin: 0.1 mg/dL — ABNORMAL LOW (ref 0.3–0.9)
Total Bilirubin: 0.2 mg/dL — ABNORMAL LOW (ref 0.3–1.2)
Total Protein: 6.8 g/dL (ref 6.0–8.3)

## 2010-07-26 LAB — LIPASE, BLOOD: Lipase: 15 U/L (ref 11–59)

## 2010-07-26 LAB — PREGNANCY, URINE: Preg Test, Ur: NEGATIVE

## 2010-07-28 LAB — BASIC METABOLIC PANEL
BUN: 10 mg/dL (ref 6–23)
BUN: 13 mg/dL (ref 6–23)
BUN: 5 mg/dL — ABNORMAL LOW (ref 6–23)
BUN: 8 mg/dL (ref 6–23)
CO2: 25 mEq/L (ref 19–32)
CO2: 26 mEq/L (ref 19–32)
CO2: 26 mEq/L (ref 19–32)
CO2: 31 mEq/L (ref 19–32)
Calcium: 10 mg/dL (ref 8.4–10.5)
Calcium: 8.9 mg/dL (ref 8.4–10.5)
Calcium: 8.9 mg/dL (ref 8.4–10.5)
Calcium: 9 mg/dL (ref 8.4–10.5)
Chloride: 102 mEq/L (ref 96–112)
Chloride: 103 mEq/L (ref 96–112)
Chloride: 108 mEq/L (ref 96–112)
Chloride: 97 mEq/L (ref 96–112)
Creatinine, Ser: 0.76 mg/dL (ref 0.4–1.2)
Creatinine, Ser: 0.91 mg/dL (ref 0.4–1.2)
Creatinine, Ser: 0.92 mg/dL (ref 0.4–1.2)
Creatinine, Ser: 1.09 mg/dL (ref 0.4–1.2)
GFR calc Af Amer: >60 mL/min (ref >60)
GFR calc Af Amer: >60 mL/min (ref >60)
GFR calc Af Amer: >60 mL/min (ref >60)
GFR calc Af Amer: >60 mL/min (ref >60)
GFR calc non Af Amer: 56 mL/min — ABNORMAL LOW (ref >60)
GFR calc non Af Amer: >60 mL/min (ref >60)
GFR calc non Af Amer: >60 mL/min (ref >60)
GFR calc non Af Amer: >60 mL/min (ref >60)
Glucose, Bld: 105 mg/dL — ABNORMAL HIGH (ref 70–99)
Glucose, Bld: 110 mg/dL — ABNORMAL HIGH (ref 70–99)
Glucose, Bld: 119 mg/dL — ABNORMAL HIGH (ref 70–99)
Glucose, Bld: 126 mg/dL — ABNORMAL HIGH (ref 70–99)
Potassium: 2.5 mEq/L — CL (ref 3.5–5.1)
Potassium: 3.5 mEq/L (ref 3.5–5.1)
Potassium: 3.7 mEq/L (ref 3.5–5.1)
Potassium: 3.9 mEq/L (ref 3.5–5.1)
Sodium: 134 mEq/L — ABNORMAL LOW (ref 135–145)
Sodium: 135 mEq/L (ref 135–145)
Sodium: 136 mEq/L (ref 135–145)
Sodium: 136 mEq/L (ref 135–145)

## 2010-07-28 LAB — DIFFERENTIAL
Basophils Absolute: 0.1 10*3/uL (ref 0.0–0.1)
Basophils Absolute: 0.1 10*3/uL (ref 0.0–0.1)
Basophils Absolute: 0.2 10*3/uL — ABNORMAL HIGH (ref 0.0–0.1)
Basophils Relative: 0 % (ref 0–1)
Basophils Relative: 1 % (ref 0–1)
Basophils Relative: 1 % (ref 0–1)
Eosinophils Absolute: 0.6 10*3/uL (ref 0.0–0.7)
Eosinophils Absolute: 0.7 10*3/uL (ref 0.0–0.7)
Eosinophils Absolute: 1.2 10*3/uL — ABNORMAL HIGH (ref 0.0–0.7)
Eosinophils Relative: 4 % (ref 0–5)
Eosinophils Relative: 5 % (ref 0–5)
Eosinophils Relative: 8 % — ABNORMAL HIGH (ref 0–5)
Lymphocytes Relative: 15 % (ref 12–46)
Lymphocytes Relative: 22 % (ref 12–46)
Lymphocytes Relative: 25 % (ref 12–46)
Lymphs Abs: 2.2 10*3/uL (ref 0.7–4.0)
Lymphs Abs: 3.3 10*3/uL (ref 0.7–4.0)
Lymphs Abs: 3.9 10*3/uL (ref 0.7–4.0)
Monocytes Absolute: 0.8 10*3/uL (ref 0.1–1.0)
Monocytes Absolute: 0.9 10*3/uL (ref 0.1–1.0)
Monocytes Absolute: 1.2 10*3/uL — ABNORMAL HIGH (ref 0.1–1.0)
Monocytes Relative: 6 % (ref 3–12)
Monocytes Relative: 6 % (ref 3–12)
Monocytes Relative: 8 % (ref 3–12)
Neutro Abs: 10.9 10*3/uL — ABNORMAL HIGH (ref 1.7–7.7)
Neutro Abs: 9.4 10*3/uL — ABNORMAL HIGH (ref 1.7–7.7)
Neutro Abs: 9.9 10*3/uL — ABNORMAL HIGH (ref 1.7–7.7)
Neutrophils Relative %: 61 % (ref 43–77)
Neutrophils Relative %: 65 % (ref 43–77)
Neutrophils Relative %: 74 % (ref 43–77)

## 2010-07-28 LAB — URINE MICROSCOPIC-ADD ON

## 2010-07-28 LAB — CBC
HCT: 32.7 % — ABNORMAL LOW (ref 36.0–46.0)
HCT: 34.4 % — ABNORMAL LOW (ref 36.0–46.0)
HCT: 38 % (ref 36.0–46.0)
Hemoglobin: 11.5 g/dL — ABNORMAL LOW (ref 12.0–15.0)
Hemoglobin: 12.2 g/dL (ref 12.0–15.0)
Hemoglobin: 13.4 g/dL (ref 12.0–15.0)
MCHC: 35.3 g/dL (ref 30.0–36.0)
MCHC: 35.4 g/dL (ref 30.0–36.0)
MCHC: 35.4 g/dL (ref 30.0–36.0)
MCV: 93.8 fL (ref 78.0–100.0)
MCV: 93.9 fL (ref 78.0–100.0)
MCV: 95.4 fL (ref 78.0–100.0)
Platelets: 358 10*3/uL (ref 150–400)
Platelets: 371 10*3/uL (ref 150–400)
Platelets: 414 10*3/uL — ABNORMAL HIGH (ref 150–400)
RBC: 3.42 MIL/uL — ABNORMAL LOW (ref 3.87–5.11)
RBC: 3.67 MIL/uL — ABNORMAL LOW (ref 3.87–5.11)
RBC: 4.05 MIL/uL (ref 3.87–5.11)
RDW: 12.8 % (ref 11.5–15.5)
RDW: 12.8 % (ref 11.5–15.5)
RDW: 12.9 % (ref 11.5–15.5)
WBC: 14.7 10*3/uL — ABNORMAL HIGH (ref 4.0–10.5)
WBC: 15.1 10*3/uL — ABNORMAL HIGH (ref 4.0–10.5)
WBC: 15.4 10*3/uL — ABNORMAL HIGH (ref 4.0–10.5)

## 2010-07-28 LAB — URINALYSIS, ROUTINE W REFLEX MICROSCOPIC
Glucose, UA: NEGATIVE mg/dL
Ketones, ur: NEGATIVE mg/dL
Leukocytes, UA: NEGATIVE
Nitrite: NEGATIVE
Protein, ur: 30 mg/dL — AB
Specific Gravity, Urine: >1.03 — ABNORMAL HIGH (ref 1.005–1.030)
Urobilinogen, UA: 0.2 mg/dL (ref 0.0–1.0)
pH: 6.5 (ref 5.0–8.0)

## 2010-07-28 LAB — PREGNANCY, URINE: Preg Test, Ur: NEGATIVE

## 2010-07-28 LAB — LITHIUM LEVEL: Lithium Lvl: 1.23 mEq/L (ref 0.80–1.40)

## 2010-07-28 LAB — MAGNESIUM: Magnesium: 2.1 mg/dL (ref 1.5–2.5)

## 2010-07-30 LAB — DIFFERENTIAL
Basophils Absolute: 0.1 10*3/uL (ref 0.0–0.1)
Basophils Relative: 1 % (ref 0–1)
Eosinophils Absolute: 0.3 10*3/uL (ref 0.0–0.7)
Eosinophils Relative: 2 % (ref 0–5)
Lymphocytes Relative: 38 % (ref 12–46)
Lymphs Abs: 6.2 10*3/uL — ABNORMAL HIGH (ref 0.7–4.0)
Monocytes Absolute: 1 10*3/uL (ref 0.1–1.0)
Monocytes Relative: 6 % (ref 3–12)
Neutro Abs: 8.5 10*3/uL — ABNORMAL HIGH (ref 1.7–7.7)
Neutrophils Relative %: 53 % (ref 43–77)

## 2010-07-30 LAB — CBC
HCT: 37.8 % (ref 36.0–46.0)
Hemoglobin: 13.4 g/dL (ref 12.0–15.0)
MCHC: 35.4 g/dL (ref 30.0–36.0)
MCV: 93.7 fL (ref 78.0–100.0)
Platelets: 443 10*3/uL — ABNORMAL HIGH (ref 150–400)
RBC: 4.03 MIL/uL (ref 3.87–5.11)
RDW: 12.9 % (ref 11.5–15.5)
WBC: 16.2 10*3/uL — ABNORMAL HIGH (ref 4.0–10.5)

## 2010-07-30 LAB — RAPID URINE DRUG SCREEN, HOSP PERFORMED
Amphetamines: NOT DETECTED
Barbiturates: NOT DETECTED
Benzodiazepines: NOT DETECTED
Cocaine: NOT DETECTED
Opiates: POSITIVE — AB
Tetrahydrocannabinol: NOT DETECTED

## 2010-07-30 LAB — POCT CARDIAC MARKERS
CKMB, poc: 1.8 ng/mL (ref 1.0–8.0)
Myoglobin, poc: 57.9 ng/mL (ref 12–200)
Troponin i, poc: <0.05 ng/mL (ref 0.00–0.09)

## 2010-07-30 LAB — BASIC METABOLIC PANEL
BUN: 7 mg/dL (ref 6–23)
CO2: 27 mEq/L (ref 19–32)
Calcium: 9.6 mg/dL (ref 8.4–10.5)
Chloride: 97 mEq/L (ref 96–112)
Creatinine, Ser: 0.88 mg/dL (ref 0.4–1.2)
GFR calc Af Amer: >60 mL/min (ref >60)
GFR calc non Af Amer: >60 mL/min (ref >60)
Glucose, Bld: 144 mg/dL — ABNORMAL HIGH (ref 70–99)
Potassium: 2.6 mEq/L — CL (ref 3.5–5.1)
Sodium: 134 mEq/L — ABNORMAL LOW (ref 135–145)

## 2010-07-31 LAB — CBC
HCT: 36.6 % (ref 36.0–46.0)
Hemoglobin: 13.1 g/dL (ref 12.0–15.0)
MCHC: 35.8 g/dL (ref 30.0–36.0)
MCV: 93.1 fL (ref 78.0–100.0)
Platelets: 320 10*3/uL (ref 150–400)
RBC: 3.93 MIL/uL (ref 3.87–5.11)
RDW: 11.9 % (ref 11.5–15.5)
WBC: 10.5 10*3/uL (ref 4.0–10.5)

## 2010-07-31 LAB — DIFFERENTIAL
Basophils Absolute: 0.1 10*3/uL (ref 0.0–0.1)
Basophils Relative: 1 % (ref 0–1)
Eosinophils Absolute: 0.5 10*3/uL (ref 0.0–0.7)
Eosinophils Relative: 5 % (ref 0–5)
Lymphocytes Relative: 51 % — ABNORMAL HIGH (ref 12–46)
Lymphs Abs: 5.3 10*3/uL — ABNORMAL HIGH (ref 0.7–4.0)
Monocytes Absolute: 0.6 10*3/uL (ref 0.1–1.0)
Monocytes Relative: 6 % (ref 3–12)
Neutro Abs: 3.9 10*3/uL (ref 1.7–7.7)
Neutrophils Relative %: 38 % — ABNORMAL LOW (ref 43–77)

## 2010-07-31 LAB — BASIC METABOLIC PANEL
BUN: 2 mg/dL — ABNORMAL LOW (ref 6–23)
CO2: 34 mEq/L — ABNORMAL HIGH (ref 19–32)
Calcium: 9.7 mg/dL (ref 8.4–10.5)
Chloride: 92 mEq/L — ABNORMAL LOW (ref 96–112)
Creatinine, Ser: 0.72 mg/dL (ref 0.4–1.2)
GFR calc Af Amer: >60 mL/min (ref >60)
GFR calc non Af Amer: >60 mL/min (ref >60)
Glucose, Bld: 116 mg/dL — ABNORMAL HIGH (ref 70–99)
Potassium: 2.7 mEq/L — CL (ref 3.5–5.1)
Sodium: 133 mEq/L — ABNORMAL LOW (ref 135–145)

## 2010-07-31 LAB — POCT CARDIAC MARKERS
CKMB, poc: 4.6 ng/mL (ref 1.0–8.0)
CKMB, poc: 4.7 ng/mL (ref 1.0–8.0)
Myoglobin, poc: 49.4 ng/mL (ref 12–200)
Myoglobin, poc: 50.8 ng/mL (ref 12–200)
Troponin i, poc: <0.05 ng/mL (ref 0.00–0.09)
Troponin i, poc: <0.05 ng/mL (ref 0.00–0.09)

## 2010-08-01 ENCOUNTER — Other Ambulatory Visit: Payer: Self-pay | Admitting: Family Medicine

## 2010-08-30 ENCOUNTER — Encounter: Payer: Self-pay | Admitting: Family Medicine

## 2010-09-05 ENCOUNTER — Other Ambulatory Visit: Payer: Self-pay | Admitting: Family Medicine

## 2010-09-05 LAB — BASIC METABOLIC PANEL
BUN: 7 mg/dL (ref 6–23)
CO2: 29 mEq/L (ref 19–32)
Calcium: 9.6 mg/dL (ref 8.4–10.5)
Chloride: 87 mEq/L — ABNORMAL LOW (ref 96–112)
Creat: 0.55 mg/dL (ref 0.40–1.20)
Glucose, Bld: 85 mg/dL (ref 70–99)
Potassium: 3.4 mEq/L — ABNORMAL LOW (ref 3.5–5.3)
Sodium: 129 mEq/L — ABNORMAL LOW (ref 135–145)

## 2010-09-05 LAB — LIPID PANEL
Cholesterol: 172 mg/dL (ref 0–200)
HDL: 35 mg/dL — ABNORMAL LOW (ref >39)
LDL Cholesterol: 117 mg/dL — ABNORMAL HIGH (ref 0–99)
Total CHOL/HDL Ratio: 4.9 Ratio
Triglycerides: 98 mg/dL (ref <150)
VLDL: 20 mg/dL (ref 0–40)

## 2010-09-05 LAB — HEPATIC FUNCTION PANEL
ALT: 8 U/L (ref 0–35)
AST: 12 U/L (ref 0–37)
Albumin: 4.3 g/dL (ref 3.5–5.2)
Alkaline Phosphatase: 136 U/L — ABNORMAL HIGH (ref 39–117)
Bilirubin, Direct: <0.1 mg/dL (ref 0.0–0.3)
Total Bilirubin: 0.3 mg/dL (ref 0.3–1.2)
Total Protein: 6.7 g/dL (ref 6.0–8.3)

## 2010-09-05 LAB — TSH: TSH: 2.392 u[IU]/mL (ref 0.350–4.500)

## 2010-09-06 LAB — HEMOGLOBIN A1C
Hgb A1c MFr Bld: 5.5 % (ref <5.7)
Mean Plasma Glucose: 111 mg/dL (ref <117)
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
## 2010-09-11 ENCOUNTER — Ambulatory Visit (INDEPENDENT_AMBULATORY_CARE_PROVIDER_SITE_OTHER): Payer: Medicare Other | Admitting: Family Medicine

## 2010-09-11 ENCOUNTER — Encounter: Payer: Self-pay | Admitting: Family Medicine

## 2010-09-11 VITALS — BP 112/80 | HR 67 | Resp 16 | Ht 62.5 in | Wt 149.1 lb

## 2010-09-11 DIAGNOSIS — F17200 Nicotine dependence, unspecified, uncomplicated: Secondary | ICD-10-CM

## 2010-09-11 DIAGNOSIS — E785 Hyperlipidemia, unspecified: Secondary | ICD-10-CM

## 2010-09-11 DIAGNOSIS — E876 Hypokalemia: Secondary | ICD-10-CM

## 2010-09-11 DIAGNOSIS — I10 Essential (primary) hypertension: Secondary | ICD-10-CM

## 2010-09-11 DIAGNOSIS — F319 Bipolar disorder, unspecified: Secondary | ICD-10-CM

## 2010-09-11 DIAGNOSIS — R946 Abnormal results of thyroid function studies: Secondary | ICD-10-CM

## 2010-09-11 DIAGNOSIS — R7301 Impaired fasting glucose: Secondary | ICD-10-CM

## 2010-09-11 DIAGNOSIS — M549 Dorsalgia, unspecified: Secondary | ICD-10-CM

## 2010-09-11 MED ORDER — KETOROLAC TROMETHAMINE 60 MG/2ML IM SOLN
60.0000 mg | Freq: Once | INTRAMUSCULAR | Status: AC
  Administered 2010-09-11: 60 mg via INTRAMUSCULAR

## 2010-09-11 MED ORDER — POTASSIUM CHLORIDE 20 MEQ/15ML (10%) PO LIQD: 20.0000 meq | Freq: Two times a day (BID) | ORAL | Status: DC

## 2010-09-11 MED ORDER — PRAVASTATIN SODIUM 80 MG PO TABS: 80.0000 mg | ORAL_TABLET | Freq: Every evening | ORAL | Status: DC

## 2010-09-11 NOTE — Patient Instructions (Addendum)
F/u in 4 months.  You will be referred for a mamogram  Please think about quitting smoking.  This is very important for your health.  Consider setting a quit date, then cutting back or switching brands to prepare to stop.  Also think of the money you will save every day by not smoking.  Quick Tips to Quit Smoking: Fix a date i.e. keep a date in mind from when you would not touch a tobacco product to smoke  Keep yourself busy and block your mind with work loads or reading books or watching movies in malls where smoking is not allowed  Vanish off the things which reminds you about smoking for example match box, or your favorite lighter, or the pipe you used for smoking, or your favorite jeans and shirt with which you used to enjoy smoking, or the club where you used to do smoking  Try to avoid certain people places and incidences where and with whom smoking is a common factor to add on  Praise yourself with some token gifts from the money you saved by stopping smoking  Anti Smoking teams are there to help you. Join their programs  Anti-smoking Gums are there in many medical shops. Try them to quit smoking   Side-effects of Smoking: Disease caused by smoking cigarettes are emphysema, bronchitis, heart failures  Premature death  Cancer is the major side effect of smoking  Heart attacks and strokes are the quick effects of smoking causing sudden death  Some smokers lives end up with limbs amputated  Breathing problem or fast breathing is another side effect of smoking  Due to more intakes of smokes, carbon mono-oxide goes into your brain and other muscles of the body which leads to swelling of the veins and blockage to the air passage to lungs  Carbon monoxide blocks blood vessels which leads to blockage in the flow of blood to different major body organs like heart lungs and thus leads to attacks and deaths  During pregnancy smoking is very harmful and leads to premature birth of the infant,  spontaneous abortions, low weight of the infant during birth  Fat depositions to narrow and blocked blood vessels causing heart attacks  In many cases cigarette smoking caused infertility in men    Congrats on weight loss, pls keep it up , your blood sugar has improved a lot.  It is important that you exercise regularly at least 30 minutes 5 times a week. If you develop chest pain, have severe difficulty breathing, or feel very tired, stop exercising immediately and seek medical attention   A healthy diet is rich in fruit, vegetables and whole grains. Poultry fish, nuts and beans are a healthy choice for protein rather then red meat. A low sodium diet and drinking 64 ounces of water daily is generally recommended. Oils and sweet should be limited. Carbohydrates especially for those who are diabetic or overweight, should be limited to 34-45 gram per meal. It is important to eat on a regular schedule, at least 3 times daily. Snacks should be primarily fruits, vegetables or nuts.   Injection today for pain.   New medication for  Cholesterol, pls reduce fried and fatty foods.   Increased dose of potassium to twice daily  Chem 7 in 4 weeks.  Fasting lipid,hepatic, chem 7 and tSH in 4 months

## 2010-09-12 ENCOUNTER — Encounter: Payer: Self-pay | Admitting: Family Medicine

## 2010-09-12 NOTE — Assessment & Plan Note (Signed)
Under corrected, dose increase on potassium

## 2010-09-12 NOTE — Assessment & Plan Note (Signed)
Controlled, no change in medication  

## 2010-09-12 NOTE — Progress Notes (Signed)
  Subjective:    Patient ID: Brooke Maddox, female    DOB: 02-Nov-1970, 40 y.o.   MRN: [id number]  HPI The PT is here for follow up and re-evaluation of chronic medical conditions, medication management and review of recent lab and radiology data.  Preventive health is updated, specifically  Cancer screening,  and Immunization.   Questions or concerns regarding consultations or procedures which the PT has had in the interim are  addressed. The PT denies any adverse reactions to current medications since the last visit.  There are no new concerns. She continues to c/o uncontrolled daily generalized pain but states she "did it to herself" by abusing cocaine in the past     Review of Systems Denies recent fever or chills. Denies sinus pressure, nasal congestion, ear pain or sore throat. Denies chest congestion, productive cough or wheezing. Denies chest pains, palpitations, paroxysmal nocturnal dyspnea, orthopnea and leg swelling Denies abdominal pain, nausea, vomiting,diarrhea or constipation.  Denies rectal bleeding or change in bowel movement. Denies dysuria, frequency, hesitancy or incontinence. Denies headaches, seizure, numbness, or tingling. Denies uncontrolled  depression, anxiety or insomnia. Denies skin break down or rash. Trying to cut back on nicotine with a view to quitting       Objective:   Physical Exam Patient alert and oriented and in no Cardiopulmonary distress.  HEENT: No facial asymmetry, EOMI, no sinus tenderness, TM's clear, Oropharynx pink and moist.  Neck supple no adenopathy.  Chest: Clear to auscultation bilaterally.Dectraresed air entry throughout  CVS: S1, S2 no murmurs, no S3.  ABD: Soft non tender. Bowel sounds normal.  Ext: No edema  MS: decreased  ROM spine, shoulders, hips and knees.  Skin: Intact, no ulcerations or rash noted.  Psych: Poor eye contact, normal affect. Memory intact not anxious or depressed appearing.  CNS: CN 2-12 intact, power,  tone and sensation normal throughout.        Assessment & Plan:

## 2010-09-12 NOTE — Assessment & Plan Note (Signed)
Improved, low fat diet encouraged , no med change

## 2010-09-12 NOTE — Assessment & Plan Note (Signed)
Stable and controlled, treated by psychiatry 

## 2010-09-12 NOTE — Assessment & Plan Note (Signed)
Unchanged , cessation counseling done 

## 2010-09-12 NOTE — Assessment & Plan Note (Signed)
Improved, with improved HBA1C and lifestyle change and weight loss

## 2010-09-12 NOTE — Assessment & Plan Note (Signed)
Unchanged, toradol administered during oV, continued weight loss encouraged

## 2010-10-02 ENCOUNTER — Other Ambulatory Visit: Payer: Self-pay | Admitting: Family Medicine

## 2010-10-02 DIAGNOSIS — Z139 Encounter for screening, unspecified: Secondary | ICD-10-CM

## 2010-10-05 ENCOUNTER — Ambulatory Visit (HOSPITAL_COMMUNITY)
Admission: RE | Admit: 2010-10-05 | Discharge: 2010-10-05 | Disposition: A | Discharge status: Home / Self Care | Payer: Medicare Other | Source: Ambulatory Visit | Attending: Family Medicine | Admitting: Family Medicine

## 2010-10-05 DIAGNOSIS — Z1231 Encounter for screening mammogram for malignant neoplasm of breast: Secondary | ICD-10-CM | POA: Insufficient documentation

## 2010-10-05 DIAGNOSIS — Z139 Encounter for screening, unspecified: Secondary | ICD-10-CM

## 2010-10-29 ENCOUNTER — Other Ambulatory Visit: Payer: Self-pay | Admitting: Family Medicine

## 2010-11-20 ENCOUNTER — Telehealth: Payer: Self-pay | Admitting: Family Medicine

## 2010-11-20 NOTE — Telephone Encounter (Signed)
Order sent to lab as requested

## 2010-11-24 ENCOUNTER — Encounter: Payer: Self-pay | Admitting: Family Medicine

## 2010-11-27 ENCOUNTER — Encounter: Payer: Self-pay | Admitting: Family Medicine

## 2010-11-27 ENCOUNTER — Ambulatory Visit: Payer: Medicare Other | Admitting: Family Medicine

## 2010-11-28 ENCOUNTER — Other Ambulatory Visit: Payer: Self-pay | Admitting: Family Medicine

## 2010-12-05 ENCOUNTER — Telehealth: Payer: Self-pay | Admitting: Family Medicine

## 2010-12-05 MED ORDER — DOXYCYCLINE HYCLATE 100 MG PO TABS: 100.0000 mg | ORAL_TABLET | Freq: Two times a day (BID) | ORAL | Status: AC

## 2010-12-05 NOTE — Telephone Encounter (Signed)
Med sent in and patient aware to collect and finish all medication

## 2010-12-05 NOTE — Telephone Encounter (Signed)
noted 

## 2010-12-05 NOTE — Telephone Encounter (Signed)
pls send in and letpt know doxycycline 100mg  one twice daily #14

## 2010-12-06 ENCOUNTER — Encounter: Payer: Self-pay | Admitting: Family Medicine

## 2010-12-06 ENCOUNTER — Ambulatory Visit (INDEPENDENT_AMBULATORY_CARE_PROVIDER_SITE_OTHER): Payer: Medicare Other | Admitting: Family Medicine

## 2010-12-06 DIAGNOSIS — I10 Essential (primary) hypertension: Secondary | ICD-10-CM

## 2010-12-06 DIAGNOSIS — R110 Nausea: Secondary | ICD-10-CM

## 2010-12-06 DIAGNOSIS — T148XXA Other injury of unspecified body region, initial encounter: Secondary | ICD-10-CM

## 2010-12-06 DIAGNOSIS — T148 Other injury of unspecified body region: Secondary | ICD-10-CM

## 2010-12-06 DIAGNOSIS — E871 Hypo-osmolality and hyponatremia: Secondary | ICD-10-CM

## 2010-12-06 DIAGNOSIS — D72829 Elevated white blood cell count, unspecified: Secondary | ICD-10-CM

## 2010-12-06 DIAGNOSIS — W57XXXA Bitten or stung by nonvenomous insect and other nonvenomous arthropods, initial encounter: Secondary | ICD-10-CM

## 2010-12-06 DIAGNOSIS — R112 Nausea with vomiting, unspecified: Secondary | ICD-10-CM

## 2010-12-06 DIAGNOSIS — R5383 Other fatigue: Secondary | ICD-10-CM

## 2010-12-06 DIAGNOSIS — R7301 Impaired fasting glucose: Secondary | ICD-10-CM

## 2010-12-06 DIAGNOSIS — R5381 Other malaise: Secondary | ICD-10-CM

## 2010-12-06 DIAGNOSIS — M25559 Pain in unspecified hip: Secondary | ICD-10-CM

## 2010-12-06 DIAGNOSIS — E876 Hypokalemia: Secondary | ICD-10-CM

## 2010-12-06 DIAGNOSIS — E785 Hyperlipidemia, unspecified: Secondary | ICD-10-CM

## 2010-12-06 LAB — CBC WITH DIFFERENTIAL/PLATELET
Basophils Absolute: 0 10*3/uL (ref 0.0–0.1)
Basophils Relative: 0 % (ref 0–1)
Eosinophils Absolute: 0.2 10*3/uL (ref 0.0–0.7)
Eosinophils Relative: 2 % (ref 0–5)
HCT: 40.2 % (ref 36.0–46.0)
Hemoglobin: 14.8 g/dL (ref 12.0–15.0)
Lymphocytes Relative: 20 % (ref 12–46)
Lymphs Abs: 2.1 10*3/uL (ref 0.7–4.0)
MCH: 34.1 pg — ABNORMAL HIGH (ref 26.0–34.0)
MCHC: 36.8 g/dL — ABNORMAL HIGH (ref 30.0–36.0)
MCV: 92.6 fL (ref 78.0–100.0)
Monocytes Absolute: 0.8 10*3/uL (ref 0.1–1.0)
Monocytes Relative: 7 % (ref 3–12)
Neutro Abs: 7.8 10*3/uL — ABNORMAL HIGH (ref 1.7–7.7)
Neutrophils Relative %: 71 % (ref 43–77)
Platelets: 382 10*3/uL (ref 150–400)
RBC: 4.34 MIL/uL (ref 3.87–5.11)
RDW: 12.5 % (ref 11.5–15.5)
WBC: 10.9 10*3/uL — ABNORMAL HIGH (ref 4.0–10.5)

## 2010-12-06 LAB — BASIC METABOLIC PANEL
BUN: 6 mg/dL (ref 6–23)
CO2: 27 mEq/L (ref 19–32)
Calcium: 9.6 mg/dL (ref 8.4–10.5)
Chloride: 87 mEq/L — ABNORMAL LOW (ref 96–112)
Creat: 0.6 mg/dL (ref 0.50–1.10)
Glucose, Bld: 96 mg/dL (ref 70–99)
Potassium: 3.6 mEq/L (ref 3.5–5.3)
Sodium: 127 mEq/L — ABNORMAL LOW (ref 135–145)

## 2010-12-06 MED ORDER — PROMETHAZINE HCL 12.5 MG RE SUPP: 12.5000 mg | Freq: Three times a day (TID) | RECTAL | Status: DC | PRN

## 2010-12-06 MED ORDER — KETOROLAC TROMETHAMINE 60 MG/2ML IM SOLN: 60.0000 mg | Freq: Once | INTRAMUSCULAR | Status: AC

## 2010-12-06 MED ORDER — ONDANSETRON HCL 4 MG/2ML IJ SOLN: 4.0000 mg | Freq: Once | INTRAMUSCULAR | Status: DC

## 2010-12-06 NOTE — Assessment & Plan Note (Signed)
C/o right hip pain wants toradol

## 2010-12-06 NOTE — Progress Notes (Signed)
  Subjective:    Patient ID: Brooke Maddox, female    DOB: Jan 14, 1971, 40 y.o.   MRN: [id number]  HPI Pt reports finding tick on left post thigh 5 days ago, since then chills, fever, and vomitting.  States she had RMSF in childhood and "nearly died"  Hospitalized for 10 days  Smokes 10 ciggs per day   Review of Systems Denies sinus pressure, nasal congestion, ear pain or sore throat. Denies chest congestion, productive cough or wheezing. Denies chest pains, palpitations and leg swelling Denies diarrhea or constipation.   Denies dysuria, frequency, hesitancy or incontinence. Chronic back pain, c/o increased and uncontrolled hip pain, no recent trauma Denies headaches, seizures, numbness, or tingling. Denies uncontrolled  depression, anxiety or insomnia.      Objective:   Physical Exam Patient alert and oriented and in no cardiopulmonary distress.  HEENT: No facial asymmetry, EOMI, no sinus tenderness,  oropharynx pink and moist.  Neck supple no adenopathy.  Chest: Clear to auscultation bilaterally.Decreased air entry  CVS: S1, S2 no murmurs, no S3.  ABD: Soft non tender. Bowel sounds normal.  Ext: No edema  MS: Adequate ROM spine, shoulders,  and knees.Decreased in hips  Skin: erythematous lesion on posterior left thigh in area of bite, no retained tick seen or felt  Psych: Good eye contact, normal affect. Memory intact not anxious or depressed appearing.  CNS: CN 2-12 intact, power, tone and sensation normal throughout.        Assessment & Plan:

## 2010-12-06 NOTE — Patient Instructions (Addendum)
CPE in mid November  Labs today  Fasting labs before next visit  LABWORK  NEEDS TO BE DONE BETWEEN 3 TO 7 DAYS BEFORE YOUR NEXT SCEDULED  VISIT.  THIS WILL IMPROVE THE QUALITY OF YOUR CARE.   Injections for pain and nausea  And suppositories sent in.  pls follow the bRAT diet   Please think about quitting smoking.  This is very important for your health.  Consider setting a quit date, then cutting back or switching brands to prepare to stop.  Also think of the money you will save every day by not smoking.  Quick Tips to Quit Smoking: Fix a date i.e. keep a date in mind from when you would not touch a tobacco product to smoke  Keep yourself busy and block your mind with work loads or reading books or watching movies in malls where smoking is not allowed  Vanish off the things which reminds you about smoking for example match box, or your favorite lighter, or the pipe you used for smoking, or your favorite jeans and shirt with which you used to enjoy smoking, or the club where you used to do smoking  Try to avoid certain people places and incidences where and with whom smoking is a common factor to add on  Praise yourself with some token gifts from the money you saved by stopping smoking  Anti Smoking teams are there to help you. Join their programs  Anti-smoking Gums are there in many medical shops. Try them to quit smoking   Side-effects of Smoking: Disease caused by smoking cigarettes are emphysema, bronchitis, heart failures  Premature death  Cancer is the major side effect of smoking  Heart attacks and strokes are the quick effects of smoking causing sudden death  Some smokers lives end up with limbs amputated  Breathing problem or fast breathing is another side effect of smoking  Due to more intakes of smokes, carbon mono-oxide goes into your brain and other muscles of the body which leads to swelling of the veins and blockage to the air passage to lungs  Carbon monoxide blocks  blood vessels which leads to blockage in the flow of blood to different major body organs like heart lungs and thus leads to attacks and deaths  During pregnancy smoking is very harmful and leads to premature birth of the infant, spontaneous abortions, low weight of the infant during birth  Fat depositions to narrow and blocked blood vessels causing heart attacks  In many cases cigarette smoking caused infertility in men

## 2010-12-07 ENCOUNTER — Ambulatory Visit: Payer: Medicare Other | Admitting: Family Medicine

## 2010-12-07 ENCOUNTER — Telehealth: Payer: Self-pay | Admitting: Family Medicine

## 2010-12-07 LAB — B. BURGDORFI ANTIBODIES BY WB
B burgdorferi IgG Abs (IB): NEGATIVE
B burgdorferi IgM Abs (IB): NEGATIVE

## 2010-12-07 LAB — ROCKY MTN SPOTTED FVR AB, IGG-BLOOD: RMSF IgG: 0.78 IV

## 2010-12-08 NOTE — Telephone Encounter (Signed)
Put in box for Dr [employer]

## 2010-12-10 DIAGNOSIS — W57XXXA Bitten or stung by nonvenomous insect and other nonvenomous arthropods, initial encounter: Secondary | ICD-10-CM | POA: Insufficient documentation

## 2010-12-10 NOTE — Assessment & Plan Note (Signed)
Titers for RMSF and lyme disease will be drawn, pt to complete doxycycline course

## 2010-12-10 NOTE — Assessment & Plan Note (Signed)
Increased and uncontrolled, med administered and prescribed

## 2010-12-10 NOTE — Assessment & Plan Note (Signed)
Controlled, no change in medication  

## 2010-12-11 ENCOUNTER — Telehealth: Payer: Self-pay | Admitting: *Deleted

## 2010-12-11 NOTE — Telephone Encounter (Signed)
States once a day and she is using the suppositories

## 2010-12-11 NOTE — Telephone Encounter (Signed)
Left detailed message.   

## 2010-12-11 NOTE — Telephone Encounter (Signed)
Error her sodium was low last week and she already was given an order to rept this week, -pls verify that she has that order and she is to continue the suppos, can get chem 7 tomorrow, not fasting

## 2010-12-11 NOTE — Telephone Encounter (Signed)
pls get more info as to # of episodes per day, if worsening go to Ed, ensure she is taking the phenmergan suppos

## 2010-12-11 NOTE — Telephone Encounter (Signed)
Called patient, left message.

## 2010-12-11 NOTE — Telephone Encounter (Signed)
This should resolve  Soon, she just had normal labs ,  No need to rept now pls let her know

## 2010-12-12 ENCOUNTER — Telehealth: Payer: Self-pay | Admitting: Family Medicine

## 2010-12-13 NOTE — Telephone Encounter (Signed)
Called patient and left message that order was sent to the lab

## 2010-12-19 ENCOUNTER — Telehealth: Payer: Self-pay | Admitting: Family Medicine

## 2010-12-19 NOTE — Telephone Encounter (Signed)
Patient states she has not gone to get blood work yet but she will try to get it by next week. States psychiatrist is concerned about her potassium and sodium levels and she is too. Patient wants to know what is causing this.

## 2010-12-19 NOTE — Telephone Encounter (Signed)
Pt may be drinking excessive amt of water, and this wioill cause sodium to be low, also many blood pressure pills cause thepotassium to be a it low,

## 2010-12-20 NOTE — Telephone Encounter (Signed)
She is aware 

## 2011-01-09 ENCOUNTER — Encounter: Payer: Self-pay | Admitting: Family Medicine

## 2011-01-10 ENCOUNTER — Ambulatory Visit (INDEPENDENT_AMBULATORY_CARE_PROVIDER_SITE_OTHER): Payer: Medicare Other | Admitting: Family Medicine

## 2011-01-10 ENCOUNTER — Telehealth: Payer: Self-pay | Admitting: Family Medicine

## 2011-01-10 ENCOUNTER — Encounter: Payer: Self-pay | Admitting: Family Medicine

## 2011-01-10 VITALS — BP 138/88 | HR 80 | Temp 97.9°F | Resp 16 | Ht 63.0 in | Wt 150.0 lb

## 2011-01-10 DIAGNOSIS — F17200 Nicotine dependence, unspecified, uncomplicated: Secondary | ICD-10-CM

## 2011-01-10 DIAGNOSIS — R51 Headache: Secondary | ICD-10-CM

## 2011-01-10 DIAGNOSIS — E871 Hypo-osmolality and hyponatremia: Secondary | ICD-10-CM

## 2011-01-10 DIAGNOSIS — J40 Bronchitis, not specified as acute or chronic: Secondary | ICD-10-CM

## 2011-01-10 DIAGNOSIS — J329 Chronic sinusitis, unspecified: Secondary | ICD-10-CM | POA: Insufficient documentation

## 2011-01-10 DIAGNOSIS — I10 Essential (primary) hypertension: Secondary | ICD-10-CM

## 2011-01-10 DIAGNOSIS — J040 Acute laryngitis: Secondary | ICD-10-CM

## 2011-01-10 MED ORDER — BENZONATATE 100 MG PO CAPS: 100.0000 mg | ORAL_CAPSULE | Freq: Four times a day (QID) | ORAL | Status: DC | PRN

## 2011-01-10 MED ORDER — KETOROLAC TROMETHAMINE 30 MG/ML IJ SOLN
60.0000 mg | Freq: Once | INTRAMUSCULAR | Status: AC
  Administered 2011-01-10: 60 mg via INTRAMUSCULAR

## 2011-01-10 MED ORDER — METHYLPREDNISOLONE ACETATE 80 MG/ML IJ SUSP
80.0000 mg | Freq: Once | INTRAMUSCULAR | Status: AC
  Administered 2011-01-10: 80 mg via INTRAMUSCULAR

## 2011-01-10 MED ORDER — SULFAMETHOXAZOLE-TRIMETHOPRIM 800-160 MG PO TABS: 1.0000 | ORAL_TABLET | Freq: Two times a day (BID) | ORAL | Status: AC

## 2011-01-10 NOTE — Progress Notes (Signed)
  Subjective:    Patient ID: Brooke Maddox, female    DOB: Jan 09, 1971, 40 y.o.   MRN: [id number]  HPI 1 week h/o head and chest congestion, yellow sputum, loss of voice, fever and chills, no known sick contact.Still smokes, but less, trying to quit slowly   Review of Systems See HPI Denies chest pains, palpitations and leg swelling Denies abdominal pain, nausea, vomiting,diarrhea or constipation.   Denies dysuria, frequency, hesitancy or incontinence. Denies joint pain, swelling and limitation in mobility. Denies  seizures, numbness, or tingling. Deniesuncontrolled depression, anxiety or insomnia. C/o headache , frontal and generalized, espescially worse with cough Denies skin break down or rash.        Objective:   Physical Exam  Patient alert and oriented and in no cardiopulmonary distress.  HEENT: No facial asymmetry, EOMI, frontal  sinus tenderness,  oropharynx pink and moist.  Neck supple bilateral anterior cervical adenitis  Chest: Decreased air entry, scattered crackles, no wheezes  CVS: S1, S2 no murmurs, no S3.  ABD: Soft non tender. Bowel sounds normal.  Ext: No edema  MS: Adequate ROM spine, shoulders, hips and knees.  Skin: Intact, no ulcerations or rash noted.  Psych: Good eye contact, normal affect. Memory intact not anxious or depressed appearing.  CNS: CN 2-12 intact, power, tone and sensation normal throughout.       Assessment & Plan:

## 2011-01-10 NOTE — Patient Instructions (Addendum)
F/u as before.  You wiill get injections for headache. Medication is sent in for sinusitis and bronchitis.    Please think about quitting smoking.  This is very important for your health.  Consider setting a quit date, then cutting back or switching brands to prepare to stop.  Also think of the money you will save every day by not smoking.  Quick Tips to Quit Smoking: Fix a date i.e. keep a date in mind from when you would not touch a tobacco product to smoke  Keep yourself busy and block your mind with work loads or reading books or watching movies in malls where smoking is not allowed  Vanish off the things which reminds you about smoking for example match box, or your favorite lighter, or the pipe you used for smoking, or your favorite jeans and shirt with which you used to enjoy smoking, or the club where you used to do smoking  Try to avoid certain people places and incidences where and with whom smoking is a common factor to add on  Praise yourself with some token gifts from the money you saved by stopping smoking  Anti Smoking teams are there to help you. Join their programs  Anti-smoking Gums are there in many medical shops. Try them to quit smoking   Side-effects of Smoking: Disease caused by smoking cigarettes are emphysema, bronchitis, heart failures  Premature death  Cancer is the major side effect of smoking  Heart attacks and strokes are the quick effects of smoking causing sudden death  Some smokers lives end up with limbs amputated  Breathing problem or fast breathing is another side effect of smoking  Due to more intakes of smokes, carbon mono-oxide goes into your brain and other muscles of the body which leads to swelling of the veins and blockage to the air passage to lungs  Carbon monoxide blocks blood vessels which leads to blockage in the flow of blood to different major body organs like heart lungs and thus leads to attacks and deaths  During pregnancy smoking is  very harmful and leads to premature birth of the infant, spontaneous abortions, low weight of the infant during birth  Fat depositions to narrow and blocked blood vessels causing heart attacks  In many cases cigarette smoking caused infertility in men

## 2011-01-11 ENCOUNTER — Ambulatory Visit: Payer: Medicare Other | Admitting: Family Medicine

## 2011-01-12 ENCOUNTER — Ambulatory Visit: Payer: Medicare Other | Admitting: Family Medicine

## 2011-01-15 ENCOUNTER — Telehealth: Payer: Self-pay | Admitting: Family Medicine

## 2011-01-15 NOTE — Telephone Encounter (Signed)
Called patient left message

## 2011-01-17 ENCOUNTER — Telehealth: Payer: Self-pay | Admitting: Family Medicine

## 2011-01-17 DIAGNOSIS — J40 Bronchitis, not specified as acute or chronic: Secondary | ICD-10-CM | POA: Insufficient documentation

## 2011-01-17 MED ORDER — AZITHROMYCIN 250 MG PO TABS: ORAL_TABLET | ORAL | Status: AC

## 2011-01-17 NOTE — Telephone Encounter (Signed)
pls let pt know z pack has be sent in

## 2011-01-17 NOTE — Assessment & Plan Note (Signed)
Septra initially prescribed, pt stated later made her feel sick, z pack prescrinbed

## 2011-01-17 NOTE — Telephone Encounter (Signed)
PATIENT STATES SHE JUST STARTED TAKING SEPTRA BUT IT MAKES HER SICK AND SHE HAS TRIED IT WITH FOOD. STATES SHE NEEDS SOMETHING BECAUSE SHE IS NOT GETTING BETTER.

## 2011-01-17 NOTE — Assessment & Plan Note (Signed)
Unchanged , cessation counseling done 

## 2011-01-17 NOTE — Assessment & Plan Note (Signed)
Controlled, no change in medication  

## 2011-01-17 NOTE — Assessment & Plan Note (Addendum)
Acute sinusitis, antibiotic prescribed, pt states making her sick, will send in z pack  instead

## 2011-01-17 NOTE — Telephone Encounter (Signed)
Patient aware.

## 2011-01-25 NOTE — Telephone Encounter (Signed)
She was very concerned about her sodium level always being abnormal. Her Psych is as well. She was complaining saying nothing is being done about it and you are telling her to see a kidney doc but she already has and she said she feels like we are putting her off. Said she could hear through the walls how much time you took with the other patients and it seemed like you rushed with her. I told her that was not the case and I would relay the message to you.

## 2011-01-25 NOTE — Telephone Encounter (Signed)
Could not get patient, left several messages on voicemail

## 2011-01-26 ENCOUNTER — Telehealth: Payer: Self-pay | Admitting: Family Medicine

## 2011-01-26 DIAGNOSIS — J329 Chronic sinusitis, unspecified: Secondary | ICD-10-CM

## 2011-01-26 MED ORDER — BENZONATATE 100 MG PO CAPS: 100.0000 mg | ORAL_CAPSULE | Freq: Four times a day (QID) | ORAL | Status: DC | PRN

## 2011-01-26 NOTE — Telephone Encounter (Signed)
Wanted me to delete the meds off her list that she did not take. Resent the cough pills to [facility] because she couldn't afford it at [facility]. Was on $4 list.

## 2011-01-29 NOTE — Telephone Encounter (Signed)
pls let pt know I tried to call her back , no response.   However, it is certainly not my intention to "putt her off"  I suggest she get chem 7  Today if possible. Let her know the kidney specialists are the docs who can help with this , so I feel this is the best thing to do , even if she has seen one in the past.  Let me know if she agrees since I want to refer her for re-eval, also if you think it best I spk with her directly, get me to the phone if you get her this morning pls

## 2011-01-30 ENCOUNTER — Other Ambulatory Visit: Payer: Self-pay | Admitting: Family Medicine

## 2011-01-30 DIAGNOSIS — E871 Hypo-osmolality and hyponatremia: Secondary | ICD-10-CM

## 2011-01-30 NOTE — Telephone Encounter (Signed)
pls refer to nephrology, Dr Alferd Apa, evaluate persistent low sodium

## 2011-01-30 NOTE — Telephone Encounter (Signed)
Addended by: Adella Hare B on: 01/30/2011 01:40 PM   Modules accepted: Orders

## 2011-01-30 NOTE — Telephone Encounter (Signed)
Patient aware and will do labs in the morning, states it is okay to refer her to kidney specialist

## 2011-01-31 ENCOUNTER — Telehealth: Payer: Self-pay | Admitting: Family Medicine

## 2011-01-31 LAB — BASIC METABOLIC PANEL
BUN: 8 mg/dL (ref 6–23)
CO2: 28 mEq/L (ref 19–32)
Calcium: 9.1 mg/dL (ref 8.4–10.5)
Chloride: 87 mEq/L — ABNORMAL LOW (ref 96–112)
Creat: 0.58 mg/dL (ref 0.50–1.10)
Glucose, Bld: 89 mg/dL (ref 70–99)
Potassium: 3.4 mEq/L — ABNORMAL LOW (ref 3.5–5.3)
Sodium: 125 mEq/L — ABNORMAL LOW (ref 135–145)

## 2011-01-31 NOTE — Telephone Encounter (Signed)
Pt has been referred to dr. Kristian Covey. Pt is aware that his office will call with appt and time

## 2011-02-02 ENCOUNTER — Other Ambulatory Visit: Payer: Self-pay | Admitting: Family Medicine

## 2011-02-02 ENCOUNTER — Telehealth: Payer: Self-pay | Admitting: Family Medicine

## 2011-02-02 MED ORDER — CLONIDINE HCL 0.1 MG PO TABS: 0.1000 mg | ORAL_TABLET | Freq: Every day | ORAL | Status: DC

## 2011-02-02 NOTE — Telephone Encounter (Signed)
sent 

## 2011-02-02 NOTE — Telephone Encounter (Signed)
I spoke directly with Brooke Maddox she understands to discontinue maxzide as this may be contributing to the low sodium. States she is unable to come to the office today. She will be starting clonidine 0.1mg  at bedtime for blood pressure. If she is unable to see nephrology by mid next week she needs a visit with me.I am attempting to get the visit scheduled asap

## 2011-02-05 ENCOUNTER — Telehealth: Payer: Self-pay | Admitting: Family Medicine

## 2011-02-05 ENCOUNTER — Encounter: Payer: Self-pay | Admitting: Family Medicine

## 2011-02-05 NOTE — Telephone Encounter (Signed)
Have patient schedule appointment for her concerns (first available)

## 2011-02-05 NOTE — Telephone Encounter (Signed)
Appt. 10.16.12 patient is aware

## 2011-02-06 ENCOUNTER — Ambulatory Visit (INDEPENDENT_AMBULATORY_CARE_PROVIDER_SITE_OTHER): Payer: Medicare Other | Admitting: Family Medicine

## 2011-02-06 ENCOUNTER — Other Ambulatory Visit: Payer: Self-pay | Admitting: Family Medicine

## 2011-02-06 ENCOUNTER — Encounter: Payer: Self-pay | Admitting: Family Medicine

## 2011-02-06 VITALS — BP 110/70 | HR 84 | Temp 98.4°F | Ht 62.75 in | Wt 154.0 lb

## 2011-02-06 DIAGNOSIS — R5381 Other malaise: Secondary | ICD-10-CM

## 2011-02-06 DIAGNOSIS — J329 Chronic sinusitis, unspecified: Secondary | ICD-10-CM

## 2011-02-06 DIAGNOSIS — R7301 Impaired fasting glucose: Secondary | ICD-10-CM

## 2011-02-06 DIAGNOSIS — E876 Hypokalemia: Secondary | ICD-10-CM

## 2011-02-06 DIAGNOSIS — R51 Headache: Secondary | ICD-10-CM

## 2011-02-06 DIAGNOSIS — F319 Bipolar disorder, unspecified: Secondary | ICD-10-CM

## 2011-02-06 DIAGNOSIS — F17200 Nicotine dependence, unspecified, uncomplicated: Secondary | ICD-10-CM

## 2011-02-06 DIAGNOSIS — I10 Essential (primary) hypertension: Secondary | ICD-10-CM

## 2011-02-06 DIAGNOSIS — E871 Hypo-osmolality and hyponatremia: Secondary | ICD-10-CM

## 2011-02-06 MED ORDER — KETOROLAC TROMETHAMINE 60 MG/2ML IM SOLN
60.0000 mg | Freq: Once | INTRAMUSCULAR | Status: AC
  Administered 2011-02-06: 60 mg via INTRAMUSCULAR

## 2011-02-06 NOTE — Patient Instructions (Addendum)
F/u in 3 months.  Toradol injection today for headache   HBA1C and chem 7 today.TSH and cbc today  Blood pressure is excellent no med change  For  headache you will be referred to neurology  Head CT scan  Return for flu vaccine in about 2 weeks, pls make appt

## 2011-02-07 LAB — CBC WITH DIFFERENTIAL/PLATELET
Basophils Absolute: 0 10*3/uL (ref 0.0–0.1)
Basophils Relative: 0 % (ref 0–1)
Eosinophils Absolute: 0.1 10*3/uL (ref 0.0–0.7)
Eosinophils Relative: 1 % (ref 0–5)
HCT: 37.3 % (ref 36.0–46.0)
Hemoglobin: 12.9 g/dL (ref 12.0–15.0)
Lymphocytes Relative: 34 % (ref 12–46)
Lymphs Abs: 3.7 10*3/uL (ref 0.7–4.0)
MCH: 32.7 pg (ref 26.0–34.0)
MCHC: 34.6 g/dL (ref 30.0–36.0)
MCV: 94.4 fL (ref 78.0–100.0)
Monocytes Absolute: 0.3 10*3/uL (ref 0.1–1.0)
Monocytes Relative: 3 % (ref 3–12)
Neutro Abs: 6.9 10*3/uL (ref 1.7–7.7)
Neutrophils Relative %: 62 % (ref 43–77)
Platelets: 368 10*3/uL (ref 150–400)
RBC: 3.95 MIL/uL (ref 3.87–5.11)
RDW: 13 % (ref 11.5–15.5)
WBC: 11 10*3/uL — ABNORMAL HIGH (ref 4.0–10.5)

## 2011-02-07 LAB — HEMOGLOBIN A1C
Hgb A1c MFr Bld: 5.7 % — ABNORMAL HIGH (ref <5.7)
Mean Plasma Glucose: 117 mg/dL — ABNORMAL HIGH (ref <117)

## 2011-02-07 LAB — BASIC METABOLIC PANEL
BUN: 2 mg/dL — ABNORMAL LOW (ref 6–23)
CO2: 25 mEq/L (ref 19–32)
Calcium: 9.2 mg/dL (ref 8.4–10.5)
Chloride: 93 mEq/L — ABNORMAL LOW (ref 96–112)
Creat: 0.56 mg/dL (ref 0.50–1.10)
Glucose, Bld: 86 mg/dL (ref 70–99)
Potassium: 3.5 mEq/L (ref 3.5–5.3)
Sodium: 130 mEq/L — ABNORMAL LOW (ref 135–145)

## 2011-02-07 LAB — TSH: TSH: 1.554 u[IU]/mL (ref 0.350–4.500)

## 2011-02-07 NOTE — Assessment & Plan Note (Signed)
Followed by psych

## 2011-02-07 NOTE — Progress Notes (Signed)
  Subjective:    Patient ID: Brooke Maddox, female    DOB: 1970/08/07, 40 y.o.   MRN: [id number]  HPI Pt in for f/u hypertension complicated by hyponatremia, which may be primarily med related. Over the past approx 4 days her med has been changed to clonidine from maxzide. She denies light headedness, or dry mouth, states that the medication is keeping her awake rather than sedating her , so I advised her to take it during the daytime instead. C/o 1 week  uncontrolled headache, pounding, primarily in the left maxillary area, but also frontal. Has used maxalt in the past, however due to potential interaction with psych meds I will not prescribe for her. States the toradol helped. C/o green foul tasting post nasal drainage and pressure over left cheek, despite antibiotic course, feels sinuses are still infected. C/o "knot " over right buttock, thinks it is where she may have recently received injection   Review of Systems See HPI Denies recent fever has had intermittent  chills. Denies  ear pain or sore throat. Denies chest congestion, productive cough or wheezing. Denies chest pains, palpitations and leg swelling C/o poor appetite x 5 days, denies vomiting or  diarreah  Denies dysuria, frequency, hesitancy or incontinence. Denies joint pain, swelling and limitation in mobility. Denies  seizures, numbness, or tingling. Denies uncontrolled  depression, anxiety or insomnia.       Objective:   Physical Exam  Patient alert and oriented and in no cardiopulmonary distress.  HEENT: No facial asymmetry, EOMI, left  sinus tenderness,  oropharynx pink and moist.  Neck supple left anterior adenopathy.  Chest: Clear to auscultation bilaterally.decreased air entry bilaterally  CVS: S1, S2 no murmurs, no S3.  ABD: Soft non tender. Bowel sounds normal.  Ext: No edema  MS: Adequate ROM spine, shoulders, hips and knees.  Skin: Intact, no ulcerations or rash noted.no nodule palpated in area of concern  over buttock.  Psych: Good eye contact, . Memory intact not anxious or depressed appearing.  CNS: CN 2-12 intact, power, tone and sensation normal throughout.       Assessment & Plan:

## 2011-02-07 NOTE — Assessment & Plan Note (Signed)
Anticipate improvement with change in antihypertensive

## 2011-02-07 NOTE — Assessment & Plan Note (Signed)
Improved but no quit date set

## 2011-02-07 NOTE — Assessment & Plan Note (Signed)
Chronic sinusitis with persistent pain and drainage, will obtain scan of sinuses to further elucidate

## 2011-02-07 NOTE — Assessment & Plan Note (Signed)
H/o migraine responsive to maxalt , however will hold off based on potential drug interactions. toradol in office

## 2011-02-07 NOTE — Assessment & Plan Note (Signed)
Controlled, no change in medication  

## 2011-02-08 ENCOUNTER — Ambulatory Visit (HOSPITAL_COMMUNITY)
Admission: RE | Admit: 2011-02-08 | Discharge: 2011-02-08 | Disposition: A | Discharge status: Home / Self Care | Payer: Medicare Other | Source: Ambulatory Visit | Attending: Family Medicine | Admitting: Family Medicine

## 2011-02-08 DIAGNOSIS — J329 Chronic sinusitis, unspecified: Secondary | ICD-10-CM

## 2011-02-08 DIAGNOSIS — J3489 Other specified disorders of nose and nasal sinuses: Secondary | ICD-10-CM | POA: Insufficient documentation

## 2011-02-09 ENCOUNTER — Other Ambulatory Visit: Payer: Self-pay | Admitting: Family Medicine

## 2011-02-09 DIAGNOSIS — J329 Chronic sinusitis, unspecified: Secondary | ICD-10-CM

## 2011-02-13 ENCOUNTER — Telehealth: Payer: Self-pay | Admitting: *Deleted

## 2011-02-13 NOTE — Telephone Encounter (Signed)
Called patient, no answer 

## 2011-02-13 NOTE — Telephone Encounter (Signed)
Message copied by Diamantina Monks on Tue Feb 13, 2011  3:00 PM ------      Message from: Syliva Overman MD E      Created: Fri Feb 09, 2011  4:40 AM       pls advise she has mild thickening in the lining of her sinuses and possibly a polyp or cyst. Since she has symptoms of pressure, I am referring her to ENT for further eval. Pls let scheduling know after you have discussed with her so appt can be scheduled

## 2011-02-13 NOTE — Telephone Encounter (Signed)
Message copied by Diamantina Monks on Tue Feb 13, 2011  2:59 PM ------      Message from: Syliva Overman MD E      Created: Wed Feb 07, 2011  5:33 AM       pls advise sodium much better, up from 125 to 130, continue fluid restriction as discussed, and the new BP medication is doing good, she needs to start eating again.      Thyroid function is normal

## 2011-02-13 NOTE — Telephone Encounter (Signed)
Message copied by BOOTHE, JAIME B on Tue Feb 13, 2011  3:00 PM ------      Message from: SIMPSON, MARGARET MD E      Created: Fri Feb 09, 2011  4:40 AM       pls advise she has mild thickening in the lining of her sinuses and possibly a polyp or cyst. Since she has symptoms of pressure, I am referring her to ENT for further eval. Pls let scheduling know after you have discussed with her so appt can be scheduled 

## 2011-02-15 NOTE — Telephone Encounter (Signed)
Called patient no answer.

## 2011-02-16 NOTE — Telephone Encounter (Signed)
Patient aware.

## 2011-02-22 ENCOUNTER — Telehealth: Payer: Self-pay | Admitting: Family Medicine

## 2011-02-22 DIAGNOSIS — J329 Chronic sinusitis, unspecified: Secondary | ICD-10-CM

## 2011-02-23 MED ORDER — BENZONATATE 100 MG PO CAPS: 100.0000 mg | ORAL_CAPSULE | Freq: Four times a day (QID) | ORAL | Status: DC | PRN

## 2011-02-23 NOTE — Telephone Encounter (Signed)
Sent in. Called and left message

## 2011-02-23 NOTE — Telephone Encounter (Signed)
Can she have more of the cough med for the weekend?

## 2011-02-23 NOTE — Telephone Encounter (Signed)
P;ls refill tessalon perles x 1 and let her know

## 2011-02-23 NOTE — Telephone Encounter (Signed)
Tried to call to get more info but no answer

## 2011-03-01 ENCOUNTER — Ambulatory Visit (INDEPENDENT_AMBULATORY_CARE_PROVIDER_SITE_OTHER): Payer: Medicare Other | Admitting: Otolaryngology

## 2011-03-01 DIAGNOSIS — R490 Dysphonia: Secondary | ICD-10-CM

## 2011-03-01 DIAGNOSIS — J320 Chronic maxillary sinusitis: Secondary | ICD-10-CM

## 2011-03-01 DIAGNOSIS — K219 Gastro-esophageal reflux disease without esophagitis: Secondary | ICD-10-CM

## 2011-03-01 DIAGNOSIS — H903 Sensorineural hearing loss, bilateral: Secondary | ICD-10-CM

## 2011-03-07 ENCOUNTER — Encounter: Payer: Self-pay | Admitting: Family Medicine

## 2011-03-08 ENCOUNTER — Ambulatory Visit: Payer: Medicare Other | Admitting: Family Medicine

## 2011-03-08 ENCOUNTER — Encounter: Payer: Self-pay | Admitting: Family Medicine

## 2011-03-08 ENCOUNTER — Ambulatory Visit (INDEPENDENT_AMBULATORY_CARE_PROVIDER_SITE_OTHER): Payer: Medicare Other | Admitting: Family Medicine

## 2011-03-08 DIAGNOSIS — E785 Hyperlipidemia, unspecified: Secondary | ICD-10-CM

## 2011-03-08 DIAGNOSIS — R7301 Impaired fasting glucose: Secondary | ICD-10-CM

## 2011-03-08 DIAGNOSIS — F319 Bipolar disorder, unspecified: Secondary | ICD-10-CM

## 2011-03-08 DIAGNOSIS — E876 Hypokalemia: Secondary | ICD-10-CM

## 2011-03-08 DIAGNOSIS — F17200 Nicotine dependence, unspecified, uncomplicated: Secondary | ICD-10-CM

## 2011-03-08 DIAGNOSIS — I10 Essential (primary) hypertension: Secondary | ICD-10-CM

## 2011-03-08 DIAGNOSIS — R5381 Other malaise: Secondary | ICD-10-CM

## 2011-03-08 DIAGNOSIS — R5383 Other fatigue: Secondary | ICD-10-CM

## 2011-03-08 DIAGNOSIS — J449 Chronic obstructive pulmonary disease, unspecified: Secondary | ICD-10-CM

## 2011-03-08 MED ORDER — BECLOMETHASONE DIPROPIONATE 40 MCG/ACT IN AERS: 2.0000 | INHALATION_SPRAY | Freq: Two times a day (BID) | RESPIRATORY_TRACT | Status: DC

## 2011-03-08 NOTE — Assessment & Plan Note (Signed)
Controlled, no change in medication  

## 2011-03-08 NOTE — Progress Notes (Signed)
  Subjective:    Patient ID: Brooke Maddox, female    DOB: 04-23-1971, 40 y.o.   MRN: [id number]  HPI Pt originally scheduled for CPE, however states due to joint pains and cough she does not feel up to it, also states her nerves are bad, she sees psychiatry. Denies suicidal or homicidal ideation  States she is tired of coughing all the time and does believe that if she stopped smoking this would help. Unwilling to set a quit date but slowly reducing usage. Recently evaluated by eNT, and has rescheduled appt with nephrology which she missed. Denies any recent fever or chills. Sputum is thick and clear   Review of Systems See HPI  Chronic  sinus pressure, nasal congestion, ear pain being evaluated and treated by ENT at this time.  Denies chest pains, palpitations and leg swelling Denies abdominal pain, nausea, vomiting,diarrhea or constipation.   Denies dysuria, frequency, hesitancy or incontinence. Chronic back pain unchanged Denies headaches, seizures, numbness, or tingling. Denies skin break down or rash.       Objective:   Physical Exam Patient alert and oriented and in no cardiopulmonary distress.  HEENT: No facial asymmetry, EOMI, no sinus tenderness,  oropharynx  moist.  Neck supple no adenopathy.  Chest: Markedly decreased air entry, with scattered wheezes, no crackles  CVS: S1, S2 no murmurs, no S3.  ABD: Soft non tender. Bowel sounds normal.  Ext: No edema  MS: decreased ROM spine,adequate in  shoulders, hips and knees.  Skin: Intact, no ulcerations or rash noted.  Psych: Good eye contact, normal affect. Memory intact not anxious or depressed appearing.  CNS: CN 2-12 intact, power, tone and sensation normal throughout.        Assessment & Plan:

## 2011-03-08 NOTE — Assessment & Plan Note (Signed)
Deteriorated, add qvar , cessation counseling done

## 2011-03-08 NOTE — Assessment & Plan Note (Signed)
Unchanged, counseled to quit 

## 2011-03-08 NOTE — Assessment & Plan Note (Signed)
Reports not doing well mentally,l not suicidal or homicidal, has psych appt next month

## 2011-03-08 NOTE — Assessment & Plan Note (Signed)
Corrected with change in anti hypertensive agent

## 2011-03-08 NOTE — Patient Instructions (Addendum)
CPE in 4.5 months.  You need to stop smoking.   Labs today lipid, hepatic and chem 7.   New med for breathing, qvar, you also need a CXR today due to chronic cough  HBA1C, tSH and chem 7 in 4.5 months, non fasting

## 2011-03-09 LAB — BASIC METABOLIC PANEL
BUN: 7 mg/dL (ref 6–23)
CO2: 25 mEq/L (ref 19–32)
Calcium: 9.6 mg/dL (ref 8.4–10.5)
Chloride: 102 mEq/L (ref 96–112)
Creat: 0.57 mg/dL (ref 0.50–1.10)
Glucose, Bld: 83 mg/dL (ref 70–99)
Potassium: 4.1 mEq/L (ref 3.5–5.3)
Sodium: 139 mEq/L (ref 135–145)

## 2011-03-09 LAB — HEPATIC FUNCTION PANEL
ALT: 8 U/L (ref 0–35)
AST: 14 U/L (ref 0–37)
Albumin: 4.5 g/dL (ref 3.5–5.2)
Alkaline Phosphatase: 127 U/L — ABNORMAL HIGH (ref 39–117)
Bilirubin, Direct: 0.1 mg/dL (ref 0.0–0.3)
Indirect Bilirubin: 0.2 mg/dL (ref 0.0–0.9)
Total Bilirubin: 0.3 mg/dL (ref 0.3–1.2)
Total Protein: 7 g/dL (ref 6.0–8.3)

## 2011-03-09 LAB — LIPID PANEL
Cholesterol: 209 mg/dL — ABNORMAL HIGH (ref 0–200)
HDL: 43 mg/dL (ref >39)
LDL Cholesterol: 139 mg/dL — ABNORMAL HIGH (ref 0–99)
Total CHOL/HDL Ratio: 4.9 Ratio
Triglycerides: 137 mg/dL (ref <150)
VLDL: 27 mg/dL (ref 0–40)

## 2011-03-23 ENCOUNTER — Other Ambulatory Visit: Payer: Self-pay

## 2011-03-23 ENCOUNTER — Other Ambulatory Visit: Payer: Self-pay | Admitting: Family Medicine

## 2011-03-23 MED ORDER — ROSUVASTATIN CALCIUM 20 MG PO TABS: 20.0000 mg | ORAL_TABLET | Freq: Every day | ORAL | Status: DC

## 2011-04-05 ENCOUNTER — Telehealth: Payer: Self-pay | Admitting: Family Medicine

## 2011-04-05 NOTE — Telephone Encounter (Signed)
Noted and agree. 

## 2011-04-19 ENCOUNTER — Ambulatory Visit (INDEPENDENT_AMBULATORY_CARE_PROVIDER_SITE_OTHER): Payer: Medicare Other | Admitting: Otolaryngology

## 2011-04-26 ENCOUNTER — Ambulatory Visit (INDEPENDENT_AMBULATORY_CARE_PROVIDER_SITE_OTHER): Payer: Medicare Other | Admitting: Otolaryngology

## 2011-04-26 DIAGNOSIS — J0100 Acute maxillary sinusitis, unspecified: Secondary | ICD-10-CM

## 2011-04-26 DIAGNOSIS — R040 Epistaxis: Secondary | ICD-10-CM | POA: Diagnosis not present

## 2011-04-26 DIAGNOSIS — J310 Chronic rhinitis: Secondary | ICD-10-CM | POA: Diagnosis not present

## 2011-05-04 ENCOUNTER — Telehealth: Payer: Self-pay

## 2011-05-06 NOTE — Telephone Encounter (Signed)
Advise clean with n/s 2 to 3 times daily, keep clean and dry. If no redness or drainage no ned to use antibiotic cream/ointment

## 2011-05-07 NOTE — Telephone Encounter (Signed)
Pt aware.

## 2011-05-09 ENCOUNTER — Encounter: Payer: Self-pay | Admitting: Family Medicine

## 2011-05-14 ENCOUNTER — Encounter: Payer: Self-pay | Admitting: Family Medicine

## 2011-05-14 ENCOUNTER — Ambulatory Visit (INDEPENDENT_AMBULATORY_CARE_PROVIDER_SITE_OTHER): Payer: Medicare Other | Admitting: Family Medicine

## 2011-05-14 VITALS — BP 128/74 | HR 82 | Resp 18 | Ht 62.75 in | Wt 159.0 lb

## 2011-05-14 DIAGNOSIS — M1990 Unspecified osteoarthritis, unspecified site: Secondary | ICD-10-CM | POA: Diagnosis not present

## 2011-05-14 DIAGNOSIS — R51 Headache: Secondary | ICD-10-CM

## 2011-05-14 DIAGNOSIS — M25559 Pain in unspecified hip: Secondary | ICD-10-CM | POA: Diagnosis not present

## 2011-05-14 DIAGNOSIS — R112 Nausea with vomiting, unspecified: Secondary | ICD-10-CM

## 2011-05-14 DIAGNOSIS — E785 Hyperlipidemia, unspecified: Secondary | ICD-10-CM

## 2011-05-14 DIAGNOSIS — R946 Abnormal results of thyroid function studies: Secondary | ICD-10-CM

## 2011-05-14 DIAGNOSIS — J449 Chronic obstructive pulmonary disease, unspecified: Secondary | ICD-10-CM

## 2011-05-14 DIAGNOSIS — J4489 Other specified chronic obstructive pulmonary disease: Secondary | ICD-10-CM

## 2011-05-14 DIAGNOSIS — F319 Bipolar disorder, unspecified: Secondary | ICD-10-CM

## 2011-05-14 DIAGNOSIS — I10 Essential (primary) hypertension: Secondary | ICD-10-CM | POA: Diagnosis not present

## 2011-05-14 DIAGNOSIS — R5381 Other malaise: Secondary | ICD-10-CM

## 2011-05-14 DIAGNOSIS — R5383 Other fatigue: Secondary | ICD-10-CM

## 2011-05-14 DIAGNOSIS — F17200 Nicotine dependence, unspecified, uncomplicated: Secondary | ICD-10-CM | POA: Diagnosis not present

## 2011-05-14 DIAGNOSIS — R7301 Impaired fasting glucose: Secondary | ICD-10-CM

## 2011-05-14 DIAGNOSIS — R569 Unspecified convulsions: Secondary | ICD-10-CM

## 2011-05-14 MED ORDER — ONDANSETRON HCL 4 MG/2ML IJ SOLN
4.0000 mg | Freq: Once | INTRAMUSCULAR | Status: AC
  Administered 2011-05-14: 4 mg via INTRAMUSCULAR

## 2011-05-14 MED ORDER — KETOROLAC TROMETHAMINE 60 MG/2ML IM SOLN
60.0000 mg | Freq: Once | INTRAMUSCULAR | Status: AC
  Administered 2011-05-14: 60 mg via INTRAMUSCULAR

## 2011-05-14 NOTE — Assessment & Plan Note (Signed)
Controlled, no change in medication  

## 2011-05-14 NOTE — Assessment & Plan Note (Signed)
Hyperlipidemia:Low fat diet discussed and encouraged.  Fasting labs prior to return

## 2011-05-14 NOTE — Assessment & Plan Note (Signed)
Smokes 10 per day, trying to cut down will try to reduce by 1 per month

## 2011-05-14 NOTE — Progress Notes (Signed)
  Subjective:    Patient ID: Brooke Maddox, female    DOB: [birth year], 41 y.o.   MRN: [id number]  HPI The PT is here for follow up and re-evaluation of chronic medical conditions, medication management and review of any available recent lab and radiology data.  Preventive health is updated, specifically  Cancer screening and Immunization.   Questions or concerns regarding consultations or procedures which the PT has had in the interim are  addressed. The PT denies any adverse reactions to current medications since the last visit.  3 month h/o headaches on avg 4 per week which are disbling. Today she reports acute loss of vision, seeing double,from right eye, feels as though something is in the eye, alsogot lightheaded andhad to lie down, similar episode last year. Not orthostatic in the office when I check Acute nausea and vomitting in the office, denies loose stool, fever or chills    Review of Systems See HPI Denies recent fever or chills. Denies sinus pressure, nasal congestion, ear pain or sore throat. Chronic chest congestion with smoker's cough, denies wheezing Denies chest pains, palpitations and leg swelling Denies diarrhea or constipation.   Denies dysuria, frequency, hesitancy or incontinence.  Denies  seizures, numbness, or tingling. Denies uncontrolled  depression, anxiety or insomnia. Denies skin break down or rash.        Objective:   Physical Exam Patient alert and oriented and in no cardiopulmonary distress.  HEENT: No facial asymmetry, EOMI, no sinus tenderness,  oropharynx pink and moist.  Neck supple no adenopathy.  Chest: Clear to auscultation bilaterally.Decreased air entry throughout  CVS: S1, S2 no murmurs, no S3.  ABD: mild epigastric tenderness, no guarding or rebound, hyperactive bowel sounds  Ext: No edema  EX:[id number]   ROM spine, and hips and knees.  Skin: Intact, no ulcerations or rash noted.  Psych:. Memory intact not anxious or depressed  appearing.  CNS: CN 2-12 intact, power, tone and sensation normal throughout.        Assessment & Plan:

## 2011-05-14 NOTE — Assessment & Plan Note (Addendum)
3 month h/o increased disabling headache frequency, and reported light headedness and vision disturbance, also repeated falls, referred to neurology

## 2011-05-14 NOTE — Patient Instructions (Addendum)
F/u in April.  You will get toradol in the office today for hip pain.  Your blood pressure did not fall and your heart rate remained the same when you stood. Blood pressure is excellent.  You are referred to neurologist about uncontrolled headaches.  Please cut back by one cigarette each month, you need to stop smoking.   Pls cut down on carbs and sweets so you lower your chance of becoming diabetic  Fasting lipid cmp , hBa1C tsh , vit D in April before return  It is important that you exercise regularly at least 30 minutes 5 times a week. If you develop chest pain, have severe difficulty breathing, or feel very tired, stop exercising immediately and seek medical attention    A healthy diet is rich in fruit, vegetables and whole grains. Poultry fish, nuts and beans are a healthy choice for protein rather then red meat. A low sodium diet and drinking 64 ounces of water daily is generally recommended. Oils and sweet should be limited. Carbohydrates especially for those who are diabetic or overweight, should be limited to 34-45 gram per meal. It is important to eat on a regular schedule, at least 3 times daily. Snacks should be primarily fruits, vegetables or nuts.

## 2011-05-14 NOTE — Assessment & Plan Note (Signed)
Increased pain for approx 2 months, also reports several recent falls, requests injection  Administer toradol in office

## 2011-05-20 DIAGNOSIS — R112 Nausea with vomiting, unspecified: Secondary | ICD-10-CM | POA: Insufficient documentation

## 2011-05-20 NOTE — Assessment & Plan Note (Signed)
Stable, followed by mental health. 

## 2011-05-20 NOTE — Assessment & Plan Note (Signed)
Acute flare of lower extremity pain toradol administered

## 2011-05-20 NOTE — Assessment & Plan Note (Signed)
Low carb diet discussed and encouraged o reduce development of diabetes

## 2011-05-20 NOTE — Assessment & Plan Note (Signed)
Acute episode in the office, no h/o diarrhea, zofran administered

## 2011-05-20 NOTE — Assessment & Plan Note (Signed)
Controlled, no change in medication  

## 2011-05-20 NOTE — Assessment & Plan Note (Signed)
Progressively worsening due to ongoing nicotine use 

## 2011-05-20 NOTE — Assessment & Plan Note (Signed)
Corrected on low dose of synthyroid

## 2011-05-30 DIAGNOSIS — F3110 Bipolar disorder, current episode manic without psychotic features, unspecified: Secondary | ICD-10-CM | POA: Diagnosis not present

## 2011-05-31 DIAGNOSIS — Z79899 Other long term (current) drug therapy: Secondary | ICD-10-CM | POA: Diagnosis not present

## 2011-06-04 ENCOUNTER — Other Ambulatory Visit: Payer: Self-pay | Admitting: Neurology

## 2011-06-04 DIAGNOSIS — R269 Unspecified abnormalities of gait and mobility: Secondary | ICD-10-CM | POA: Diagnosis not present

## 2011-06-04 DIAGNOSIS — Z79899 Other long term (current) drug therapy: Secondary | ICD-10-CM | POA: Diagnosis not present

## 2011-06-04 DIAGNOSIS — R270 Ataxia, unspecified: Secondary | ICD-10-CM

## 2011-06-04 DIAGNOSIS — R5381 Other malaise: Secondary | ICD-10-CM | POA: Diagnosis not present

## 2011-06-04 DIAGNOSIS — M810 Age-related osteoporosis without current pathological fracture: Secondary | ICD-10-CM | POA: Diagnosis not present

## 2011-06-04 DIAGNOSIS — G4700 Insomnia, unspecified: Secondary | ICD-10-CM | POA: Diagnosis not present

## 2011-06-04 DIAGNOSIS — I10 Essential (primary) hypertension: Secondary | ICD-10-CM | POA: Diagnosis not present

## 2011-06-04 DIAGNOSIS — H532 Diplopia: Secondary | ICD-10-CM

## 2011-06-04 DIAGNOSIS — R51 Headache: Secondary | ICD-10-CM

## 2011-06-06 ENCOUNTER — Ambulatory Visit (HOSPITAL_COMMUNITY): Admission: RE | Admit: 2011-06-06 | Payer: Medicare Other | Source: Ambulatory Visit

## 2011-06-12 ENCOUNTER — Ambulatory Visit (HOSPITAL_COMMUNITY)
Admission: RE | Admit: 2011-06-12 | Discharge: 2011-06-12 | Disposition: A | Discharge status: Home / Self Care | Payer: Medicare Other | Source: Ambulatory Visit | Attending: Neurology | Admitting: Neurology

## 2011-06-12 DIAGNOSIS — R51 Headache: Secondary | ICD-10-CM | POA: Diagnosis not present

## 2011-06-12 DIAGNOSIS — H532 Diplopia: Secondary | ICD-10-CM

## 2011-06-12 DIAGNOSIS — H538 Other visual disturbances: Secondary | ICD-10-CM | POA: Diagnosis not present

## 2011-06-12 DIAGNOSIS — R270 Ataxia, unspecified: Secondary | ICD-10-CM

## 2011-06-12 MED ORDER — GADOBENATE DIMEGLUMINE 529 MG/ML IV SOLN
15.0000 mL | Freq: Once | INTRAVENOUS | Status: AC | PRN
  Administered 2011-06-12: 15 mL via INTRAVENOUS

## 2011-06-18 ENCOUNTER — Ambulatory Visit: Payer: Medicare Other | Attending: Neurology | Admitting: Sleep Medicine

## 2011-06-18 DIAGNOSIS — G4710 Hypersomnia, unspecified: Secondary | ICD-10-CM | POA: Insufficient documentation

## 2011-06-18 DIAGNOSIS — G4730 Sleep apnea, unspecified: Secondary | ICD-10-CM

## 2011-06-26 NOTE — Procedures (Signed)
NAMELANDREE, Brooke Maddox                     ACCOUNT NO.:  [id number]  MEDICAL RECORD NO.:  [id number]          PATIENT TYPE:  OUT  LOCATION:  SLEEP LAB                     FACILITY:  [facility]  PHYSICIAN:  Biviana Saddler A. Gerilyn Pilgrim, M.D. DATE OF BIRTH:  1971/02/23  DATE OF STUDY:  06/18/2011                           NOCTURNAL POLYSOMNOGRAM  REFERRING PHYSICIAN:  Jaylena Holloway A. Gerilyn Pilgrim, M.D.  INDICATION:  A 41 year old who presents with fatigue and snoring.  This study is being done to evaluate for obstructive sleep apnea syndrome.  MEDICATIONS:  Lamotrigine, alprazolam, Prozac, clonidine, carbamazepine, potassium, Synthroid.  EPWORTH SLEEPINESS SCALE: 1. BMI 27.  ARCHITECTURAL SUMMARY:  The total recording time is 410 minutes.  Sleep efficiency 52%.  Sleep latency 142 minutes.  REM latency 205 minutes. Stage N1 9%, N2 63%, N3 12%, and REM sleep 16%.  RESPIRATORY SUMMARY:  Baseline oxygen saturation is 97, lowest saturation 87, AHI 4, and RDI 4.  LIMB MOVEMENT SUMMARY:  PLM index 17.  ELECTROCARDIOGRAM SUMMARY:  Average heart rate is 53 with no significant dysrhythmias observed.  IMPRESSION:  Moderate periodic limb movement disorder sleep, otherwise unremarkable study.    Jarae Panas A. Gerilyn Pilgrim, M.D.    KAD/MEDQ  D:  06/25/2011 08:47:20  T:  06/26/2011 00:05:26  Job:  [id number]

## 2011-06-28 ENCOUNTER — Ambulatory Visit (INDEPENDENT_AMBULATORY_CARE_PROVIDER_SITE_OTHER): Payer: Medicare Other | Admitting: Otolaryngology

## 2011-07-04 DIAGNOSIS — G43919 Migraine, unspecified, intractable, without status migrainosus: Secondary | ICD-10-CM | POA: Diagnosis not present

## 2011-07-04 DIAGNOSIS — G4710 Hypersomnia, unspecified: Secondary | ICD-10-CM | POA: Diagnosis not present

## 2011-07-04 DIAGNOSIS — M545 Low back pain, unspecified: Secondary | ICD-10-CM | POA: Diagnosis not present

## 2011-07-04 DIAGNOSIS — R269 Unspecified abnormalities of gait and mobility: Secondary | ICD-10-CM | POA: Diagnosis not present

## 2011-07-04 DIAGNOSIS — G4700 Insomnia, unspecified: Secondary | ICD-10-CM | POA: Diagnosis not present

## 2011-07-04 DIAGNOSIS — R51 Headache: Secondary | ICD-10-CM | POA: Diagnosis not present

## 2011-07-04 DIAGNOSIS — Z79899 Other long term (current) drug therapy: Secondary | ICD-10-CM | POA: Diagnosis not present

## 2011-07-05 ENCOUNTER — Ambulatory Visit (HOSPITAL_COMMUNITY)
Admission: RE | Admit: 2011-07-05 | Discharge: 2011-07-05 | Disposition: A | Discharge status: Home / Self Care | Payer: Medicare Other | Source: Ambulatory Visit | Attending: Neurology | Admitting: Neurology

## 2011-07-05 ENCOUNTER — Encounter: Payer: Medicare Other | Admitting: Family Medicine

## 2011-07-05 ENCOUNTER — Other Ambulatory Visit: Payer: Self-pay | Admitting: Neurology

## 2011-07-05 DIAGNOSIS — M5137 Other intervertebral disc degeneration, lumbosacral region: Secondary | ICD-10-CM | POA: Diagnosis not present

## 2011-07-05 DIAGNOSIS — M545 Low back pain, unspecified: Secondary | ICD-10-CM | POA: Diagnosis not present

## 2011-07-05 DIAGNOSIS — Q762 Congenital spondylolisthesis: Secondary | ICD-10-CM | POA: Diagnosis not present

## 2011-07-05 DIAGNOSIS — M4310 Spondylolisthesis, site unspecified: Secondary | ICD-10-CM | POA: Diagnosis not present

## 2011-07-17 DIAGNOSIS — IMO0002 Reserved for concepts with insufficient information to code with codable children: Secondary | ICD-10-CM | POA: Diagnosis not present

## 2011-07-17 DIAGNOSIS — G609 Hereditary and idiopathic neuropathy, unspecified: Secondary | ICD-10-CM | POA: Diagnosis not present

## 2011-07-24 ENCOUNTER — Encounter: Payer: Self-pay | Admitting: Family Medicine

## 2011-08-01 DIAGNOSIS — G8929 Other chronic pain: Secondary | ICD-10-CM | POA: Diagnosis not present

## 2011-08-01 DIAGNOSIS — M1990 Unspecified osteoarthritis, unspecified site: Secondary | ICD-10-CM | POA: Diagnosis not present

## 2011-08-01 DIAGNOSIS — G43009 Migraine without aura, not intractable, without status migrainosus: Secondary | ICD-10-CM | POA: Diagnosis not present

## 2011-08-01 DIAGNOSIS — R269 Unspecified abnormalities of gait and mobility: Secondary | ICD-10-CM | POA: Diagnosis not present

## 2011-08-22 DIAGNOSIS — F3110 Bipolar disorder, current episode manic without psychotic features, unspecified: Secondary | ICD-10-CM | POA: Diagnosis not present

## 2011-08-30 ENCOUNTER — Other Ambulatory Visit: Payer: Self-pay | Admitting: Family Medicine

## 2011-08-30 DIAGNOSIS — Z139 Encounter for screening, unspecified: Secondary | ICD-10-CM

## 2011-08-31 DIAGNOSIS — M545 Low back pain, unspecified: Secondary | ICD-10-CM | POA: Diagnosis not present

## 2011-08-31 DIAGNOSIS — IMO0002 Reserved for concepts with insufficient information to code with codable children: Secondary | ICD-10-CM | POA: Diagnosis not present

## 2011-09-10 DIAGNOSIS — G43719 Chronic migraine without aura, intractable, without status migrainosus: Secondary | ICD-10-CM | POA: Diagnosis not present

## 2011-09-10 DIAGNOSIS — Z79899 Other long term (current) drug therapy: Secondary | ICD-10-CM | POA: Diagnosis not present

## 2011-09-10 DIAGNOSIS — M1990 Unspecified osteoarthritis, unspecified site: Secondary | ICD-10-CM | POA: Diagnosis not present

## 2011-09-10 DIAGNOSIS — G8929 Other chronic pain: Secondary | ICD-10-CM | POA: Diagnosis not present

## 2011-10-08 ENCOUNTER — Ambulatory Visit (HOSPITAL_COMMUNITY)
Admission: RE | Admit: 2011-10-08 | Discharge: 2011-10-08 | Disposition: A | Discharge status: Home / Self Care | Payer: Medicare Other | Source: Ambulatory Visit | Attending: Family Medicine | Admitting: Family Medicine

## 2011-10-08 DIAGNOSIS — Z139 Encounter for screening, unspecified: Secondary | ICD-10-CM

## 2011-10-08 DIAGNOSIS — Z1231 Encounter for screening mammogram for malignant neoplasm of breast: Secondary | ICD-10-CM | POA: Diagnosis not present

## 2011-11-30 ENCOUNTER — Emergency Department (HOSPITAL_COMMUNITY)
Admission: EM | Admit: 2011-11-30 | Discharge: 2011-11-30 | Disposition: A | Discharge status: Home / Self Care | Payer: Medicare Other | Attending: Emergency Medicine | Admitting: Emergency Medicine

## 2011-11-30 ENCOUNTER — Encounter (HOSPITAL_COMMUNITY): Payer: Self-pay | Admitting: Emergency Medicine

## 2011-11-30 DIAGNOSIS — S51009A Unspecified open wound of unspecified elbow, initial encounter: Secondary | ICD-10-CM | POA: Diagnosis not present

## 2011-11-30 DIAGNOSIS — E785 Hyperlipidemia, unspecified: Secondary | ICD-10-CM | POA: Diagnosis not present

## 2011-11-30 DIAGNOSIS — I10 Essential (primary) hypertension: Secondary | ICD-10-CM | POA: Insufficient documentation

## 2011-11-30 DIAGNOSIS — J4489 Other specified chronic obstructive pulmonary disease: Secondary | ICD-10-CM | POA: Diagnosis not present

## 2011-11-30 DIAGNOSIS — J449 Chronic obstructive pulmonary disease, unspecified: Secondary | ICD-10-CM | POA: Diagnosis not present

## 2011-11-30 DIAGNOSIS — Z79899 Other long term (current) drug therapy: Secondary | ICD-10-CM | POA: Insufficient documentation

## 2011-11-30 DIAGNOSIS — S51019A Laceration without foreign body of unspecified elbow, initial encounter: Secondary | ICD-10-CM

## 2011-11-30 MED ORDER — LIDOCAINE-EPINEPHRINE 2 %-1:100000 IJ SOLN: 10.0000 mL | Freq: Once | INTRAMUSCULAR | Status: DC

## 2011-11-30 MED ORDER — LIDOCAINE-EPINEPHRINE (PF) 1 %-1:200000 IJ SOLN
INTRAMUSCULAR | Status: AC
  Filled 2011-11-30: qty 10

## 2011-11-30 MED ORDER — OXYCODONE-ACETAMINOPHEN 5-325 MG PO TABS
2.0000 | ORAL_TABLET | Freq: Once | ORAL | Status: AC
  Administered 2011-11-30: 2 via ORAL
  Filled 2011-11-30: qty 2

## 2011-11-30 MED ORDER — IBUPROFEN 400 MG PO TABS
400.0000 mg | ORAL_TABLET | Freq: Once | ORAL | Status: AC
  Administered 2011-11-30: 400 mg via ORAL
  Filled 2011-11-30: qty 1
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
## 2011-11-30 NOTE — ED Notes (Signed)
Pt stable at discharge Verbalized understanding of wound care

## 2011-11-30 NOTE — ED Notes (Signed)
Patient given ice chips per MD approval.  
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
## 2011-12-10 ENCOUNTER — Encounter (HOSPITAL_COMMUNITY): Payer: Self-pay | Admitting: *Deleted

## 2011-12-10 ENCOUNTER — Emergency Department (HOSPITAL_COMMUNITY)
Admission: EM | Admit: 2011-12-10 | Discharge: 2011-12-10 | Disposition: A | Discharge status: Home / Self Care | Payer: Medicare Other | Attending: Emergency Medicine | Admitting: Emergency Medicine

## 2011-12-10 DIAGNOSIS — F17200 Nicotine dependence, unspecified, uncomplicated: Secondary | ICD-10-CM | POA: Diagnosis not present

## 2011-12-10 DIAGNOSIS — Z8261 Family history of arthritis: Secondary | ICD-10-CM | POA: Insufficient documentation

## 2011-12-10 DIAGNOSIS — Z4802 Encounter for removal of sutures: Secondary | ICD-10-CM | POA: Diagnosis not present

## 2011-12-10 DIAGNOSIS — Z836 Family history of other diseases of the respiratory system: Secondary | ICD-10-CM | POA: Diagnosis not present

## 2011-12-10 DIAGNOSIS — T8140XA Infection following a procedure, unspecified, initial encounter: Secondary | ICD-10-CM | POA: Diagnosis not present

## 2011-12-10 DIAGNOSIS — M25569 Pain in unspecified knee: Secondary | ICD-10-CM | POA: Diagnosis not present

## 2011-12-10 DIAGNOSIS — L089 Local infection of the skin and subcutaneous tissue, unspecified: Secondary | ICD-10-CM | POA: Diagnosis not present

## 2011-12-10 DIAGNOSIS — Z8249 Family history of ischemic heart disease and other diseases of the circulatory system: Secondary | ICD-10-CM | POA: Diagnosis not present

## 2011-12-10 DIAGNOSIS — I10 Essential (primary) hypertension: Secondary | ICD-10-CM | POA: Diagnosis not present

## 2011-12-10 DIAGNOSIS — M1990 Unspecified osteoarthritis, unspecified site: Secondary | ICD-10-CM | POA: Insufficient documentation

## 2011-12-10 DIAGNOSIS — E785 Hyperlipidemia, unspecified: Secondary | ICD-10-CM | POA: Insufficient documentation

## 2011-12-10 DIAGNOSIS — M129 Arthropathy, unspecified: Secondary | ICD-10-CM | POA: Insufficient documentation

## 2011-12-10 DIAGNOSIS — J449 Chronic obstructive pulmonary disease, unspecified: Secondary | ICD-10-CM | POA: Diagnosis not present

## 2011-12-10 DIAGNOSIS — Z888 Allergy status to other drugs, medicaments and biological substances status: Secondary | ICD-10-CM | POA: Insufficient documentation

## 2011-12-10 DIAGNOSIS — Y849 Medical procedure, unspecified as the cause of abnormal reaction of the patient, or of later complication, without mention of misadventure at the time of the procedure: Secondary | ICD-10-CM | POA: Insufficient documentation

## 2011-12-10 DIAGNOSIS — F319 Bipolar disorder, unspecified: Secondary | ICD-10-CM | POA: Insufficient documentation

## 2011-12-10 DIAGNOSIS — G43909 Migraine, unspecified, not intractable, without status migrainosus: Secondary | ICD-10-CM | POA: Diagnosis not present

## 2011-12-10 DIAGNOSIS — R569 Unspecified convulsions: Secondary | ICD-10-CM | POA: Insufficient documentation

## 2011-12-10 DIAGNOSIS — Z91041 Radiographic dye allergy status: Secondary | ICD-10-CM | POA: Insufficient documentation

## 2011-12-10 DIAGNOSIS — R5381 Other malaise: Secondary | ICD-10-CM | POA: Diagnosis not present

## 2011-12-10 DIAGNOSIS — T148XXA Other injury of unspecified body region, initial encounter: Secondary | ICD-10-CM | POA: Insufficient documentation

## 2011-12-10 DIAGNOSIS — J4489 Other specified chronic obstructive pulmonary disease: Secondary | ICD-10-CM | POA: Insufficient documentation

## 2011-12-10 DIAGNOSIS — F341 Dysthymic disorder: Secondary | ICD-10-CM | POA: Diagnosis not present

## 2011-12-10 DIAGNOSIS — G8929 Other chronic pain: Secondary | ICD-10-CM | POA: Diagnosis not present

## 2011-12-10 DIAGNOSIS — E669 Obesity, unspecified: Secondary | ICD-10-CM | POA: Diagnosis not present

## 2011-12-10 DIAGNOSIS — Z880 Allergy status to penicillin: Secondary | ICD-10-CM | POA: Insufficient documentation

## 2011-12-10 DIAGNOSIS — Z885 Allergy status to narcotic agent status: Secondary | ICD-10-CM | POA: Insufficient documentation

## 2011-12-10 DIAGNOSIS — M549 Dorsalgia, unspecified: Secondary | ICD-10-CM | POA: Insufficient documentation

## 2011-12-10 MED ORDER — SULFAMETHOXAZOLE-TRIMETHOPRIM 800-160 MG PO TABS: 1.0000 | ORAL_TABLET | Freq: Two times a day (BID) | ORAL | Status: AC

## 2011-12-10 MED ORDER — HYDROCODONE-ACETAMINOPHEN 5-325 MG PO TABS: 1.0000 | ORAL_TABLET | ORAL | Status: AC | PRN

## 2011-12-10 NOTE — ED Notes (Signed)
Pt here for stitch removal, pt had stitches placed to posterior area of right elbow 10 days ago, pt denies any complaints,

## 2011-12-10 NOTE — ED Notes (Signed)
Pt here for suture removal to right elbow, states sutures been in x 10 days, redness noted to site
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
## 2011-12-13 NOTE — ED Provider Notes (Signed)
Medical screening examination/treatment/procedure(s) were performed by non-physician practitioner and as supervising physician I was immediately available for consultation/collaboration. Shuronda Santino, MD, FACEP   Edmundo Tedesco L Renee Erb, MD 12/13/11 0241 

## 2011-12-30 DIAGNOSIS — Z23 Encounter for immunization: Secondary | ICD-10-CM | POA: Diagnosis not present

## 2012-01-09 ENCOUNTER — Ambulatory Visit: Payer: Medicare Other | Admitting: Family Medicine

## 2012-01-09 ENCOUNTER — Encounter: Payer: Self-pay | Admitting: Family Medicine

## 2012-01-24 ENCOUNTER — Ambulatory Visit (INDEPENDENT_AMBULATORY_CARE_PROVIDER_SITE_OTHER): Payer: Medicare Other | Admitting: Otolaryngology

## 2012-02-17 ENCOUNTER — Other Ambulatory Visit: Payer: Self-pay | Admitting: Family Medicine

## 2012-02-18 DIAGNOSIS — F3110 Bipolar disorder, current episode manic without psychotic features, unspecified: Secondary | ICD-10-CM | POA: Diagnosis not present

## 2012-02-21 ENCOUNTER — Encounter: Payer: Self-pay | Admitting: Family Medicine

## 2012-02-21 ENCOUNTER — Ambulatory Visit (INDEPENDENT_AMBULATORY_CARE_PROVIDER_SITE_OTHER): Payer: Medicare Other | Admitting: Family Medicine

## 2012-02-21 VITALS — BP 110/70 | HR 70 | Resp 18 | Ht 62.75 in | Wt 158.0 lb

## 2012-02-21 DIAGNOSIS — E785 Hyperlipidemia, unspecified: Secondary | ICD-10-CM

## 2012-02-21 DIAGNOSIS — J4489 Other specified chronic obstructive pulmonary disease: Secondary | ICD-10-CM

## 2012-02-21 DIAGNOSIS — M549 Dorsalgia, unspecified: Secondary | ICD-10-CM | POA: Diagnosis not present

## 2012-02-21 DIAGNOSIS — M1990 Unspecified osteoarthritis, unspecified site: Secondary | ICD-10-CM

## 2012-02-21 DIAGNOSIS — R946 Abnormal results of thyroid function studies: Secondary | ICD-10-CM

## 2012-02-21 DIAGNOSIS — R5381 Other malaise: Secondary | ICD-10-CM

## 2012-02-21 DIAGNOSIS — F319 Bipolar disorder, unspecified: Secondary | ICD-10-CM

## 2012-02-21 DIAGNOSIS — I10 Essential (primary) hypertension: Secondary | ICD-10-CM | POA: Diagnosis not present

## 2012-02-21 DIAGNOSIS — R112 Nausea with vomiting, unspecified: Secondary | ICD-10-CM

## 2012-02-21 DIAGNOSIS — J449 Chronic obstructive pulmonary disease, unspecified: Secondary | ICD-10-CM | POA: Diagnosis not present

## 2012-02-21 DIAGNOSIS — R7301 Impaired fasting glucose: Secondary | ICD-10-CM

## 2012-02-21 DIAGNOSIS — F17200 Nicotine dependence, unspecified, uncomplicated: Secondary | ICD-10-CM | POA: Diagnosis not present

## 2012-02-21 MED ORDER — KETOROLAC TROMETHAMINE 60 MG/2ML IJ SOLN
60.0000 mg | Freq: Once | INTRAMUSCULAR | Status: AC
  Administered 2012-02-21: 60 mg via INTRAMUSCULAR

## 2012-02-21 MED ORDER — PROMETHAZINE HCL 25 MG RE SUPP: RECTAL | Status: DC
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
## 2012-02-21 NOTE — Patient Instructions (Addendum)
Pelvic and breast  in 4.5 month  Toradol 60mg  IM in office today for back pain  CBc, fasting lipid, cmp, TSH, HBA1C , vit D in am  Please set a quit date and stop smoking  Refill on clonidine today  It is important that you exercise regularly at least 30 minutes 5 times a week. If you develop chest pain, have severe difficulty breathing, or feel very tired, stop exercising immediately and seek medical attention

## 2012-02-24 NOTE — Assessment & Plan Note (Signed)
Controlled, no change in medication DASH diet and commitment to daily physical activity for a minimum of 30 minutes discussed and encouraged, as a part of hypertension management. The importance of attaining a healthy weight is also discussed.  

## 2012-02-24 NOTE — Assessment & Plan Note (Signed)
Continues to deteriorate due to ongoing nicotine

## 2012-02-24 NOTE — Assessment & Plan Note (Signed)
Hyperlipidemia:Low fat diet discussed and encouraged.  Updated lab needed 

## 2012-02-24 NOTE — Assessment & Plan Note (Signed)
Increased and uncontrolled, toradol in office 

## 2012-02-24 NOTE — Assessment & Plan Note (Signed)

## 2012-02-24 NOTE — Assessment & Plan Note (Signed)
Patient educated about the importance of limiting  Carbohydrate intake , the need to commit to daily physical activity for a minimum of 30 minutes , and to commit weight loss. The fact that changes in all these areas will reduce or eliminate all together the development of diabetes is stressed.   Updated HBa1C past due

## 2012-02-24 NOTE — Assessment & Plan Note (Signed)
satble and followed by psych

## 2012-02-25 ENCOUNTER — Telehealth: Payer: Self-pay | Admitting: Family Medicine

## 2012-02-25 MED ORDER — CLONIDINE HCL 0.1 MG PO TABS: 0.1000 mg | ORAL_TABLET | Freq: Every day | ORAL | Status: DC

## 2012-02-25 NOTE — Telephone Encounter (Signed)
Resent in

## 2012-02-26 DIAGNOSIS — E785 Hyperlipidemia, unspecified: Secondary | ICD-10-CM | POA: Diagnosis not present

## 2012-02-26 DIAGNOSIS — R7301 Impaired fasting glucose: Secondary | ICD-10-CM | POA: Diagnosis not present

## 2012-02-26 DIAGNOSIS — I10 Essential (primary) hypertension: Secondary | ICD-10-CM | POA: Diagnosis not present

## 2012-02-26 DIAGNOSIS — R946 Abnormal results of thyroid function studies: Secondary | ICD-10-CM | POA: Diagnosis not present

## 2012-02-26 LAB — COMPREHENSIVE METABOLIC PANEL
ALT: <8 U/L (ref 0–35)
AST: 11 U/L (ref 0–37)
Albumin: 4.1 g/dL (ref 3.5–5.2)
Alkaline Phosphatase: 92 U/L (ref 39–117)
BUN: 9 mg/dL (ref 6–23)
CO2: 27 mEq/L (ref 19–32)
Calcium: 9.5 mg/dL (ref 8.4–10.5)
Chloride: 104 mEq/L (ref 96–112)
Creat: 0.77 mg/dL (ref 0.50–1.10)
Glucose, Bld: 75 mg/dL (ref 70–99)
Potassium: 3.8 mEq/L (ref 3.5–5.3)
Sodium: 142 mEq/L (ref 135–145)
Total Bilirubin: 0.3 mg/dL (ref 0.3–1.2)
Total Protein: 6.1 g/dL (ref 6.0–8.3)

## 2012-02-26 LAB — CBC
HCT: 39.3 % (ref 36.0–46.0)
Hemoglobin: 13 g/dL (ref 12.0–15.0)
MCH: 31.5 pg (ref 26.0–34.0)
MCHC: 33.1 g/dL (ref 30.0–36.0)
MCV: 95.2 fL (ref 78.0–100.0)
Platelets: 327 10*3/uL (ref 150–400)
RBC: 4.13 MIL/uL (ref 3.87–5.11)
RDW: 13 % (ref 11.5–15.5)
WBC: 9.3 10*3/uL (ref 4.0–10.5)

## 2012-02-26 LAB — LIPID PANEL
Cholesterol: 120 mg/dL (ref 0–200)
HDL: 46 mg/dL (ref >39)
LDL Cholesterol: 51 mg/dL (ref 0–99)
Total CHOL/HDL Ratio: 2.6 Ratio
Triglycerides: 113 mg/dL (ref <150)
VLDL: 23 mg/dL (ref 0–40)

## 2012-02-26 LAB — HEMOGLOBIN A1C
Hgb A1c MFr Bld: 5.7 % — ABNORMAL HIGH (ref <5.7)
Mean Plasma Glucose: 117 mg/dL — ABNORMAL HIGH (ref <117)

## 2012-02-26 LAB — TSH: TSH: 2.433 u[IU]/mL (ref 0.350–4.500)

## 2012-03-02 ENCOUNTER — Other Ambulatory Visit: Payer: Self-pay | Admitting: Family Medicine

## 2012-04-22 ENCOUNTER — Other Ambulatory Visit: Payer: Self-pay | Admitting: Family Medicine

## 2012-04-24 DIAGNOSIS — H35039 Hypertensive retinopathy, unspecified eye: Secondary | ICD-10-CM | POA: Diagnosis not present

## 2012-05-07 DIAGNOSIS — F3110 Bipolar disorder, current episode manic without psychotic features, unspecified: Secondary | ICD-10-CM | POA: Diagnosis not present

## 2012-05-13 ENCOUNTER — Other Ambulatory Visit: Payer: Self-pay | Admitting: Family Medicine

## 2012-05-27 ENCOUNTER — Telehealth: Payer: Self-pay | Admitting: Family Medicine

## 2012-05-27 MED ORDER — TIOTROPIUM BROMIDE MONOHYDRATE 18 MCG IN CAPS: 18.0000 ug | ORAL_CAPSULE | Freq: Every day | RESPIRATORY_TRACT | Status: DC

## 2012-05-27 NOTE — Telephone Encounter (Signed)
Sent in

## 2012-06-18 ENCOUNTER — Encounter: Payer: Medicare Other | Admitting: Family Medicine

## 2012-06-19 ENCOUNTER — Encounter: Payer: Medicare Other | Admitting: Family Medicine

## 2012-06-25 ENCOUNTER — Telehealth: Payer: Self-pay | Admitting: Family Medicine

## 2012-06-25 NOTE — Telephone Encounter (Signed)
Wanted to know why she was having hot flashes, if it was menopause. I advised her that the dr would address it when she came in for her appt and maybe do some labs

## 2012-07-14 ENCOUNTER — Ambulatory Visit (INDEPENDENT_AMBULATORY_CARE_PROVIDER_SITE_OTHER): Payer: Medicare Other | Admitting: Family Medicine

## 2012-07-14 ENCOUNTER — Encounter: Payer: Self-pay | Admitting: Family Medicine

## 2012-07-14 VITALS — BP 108/82 | HR 83 | Resp 16 | Ht 62.75 in | Wt 158.8 lb

## 2012-07-14 DIAGNOSIS — R946 Abnormal results of thyroid function studies: Secondary | ICD-10-CM

## 2012-07-14 DIAGNOSIS — R7301 Impaired fasting glucose: Secondary | ICD-10-CM

## 2012-07-14 DIAGNOSIS — I10 Essential (primary) hypertension: Secondary | ICD-10-CM

## 2012-07-14 DIAGNOSIS — M549 Dorsalgia, unspecified: Secondary | ICD-10-CM | POA: Diagnosis not present

## 2012-07-14 DIAGNOSIS — F17200 Nicotine dependence, unspecified, uncomplicated: Secondary | ICD-10-CM

## 2012-07-14 DIAGNOSIS — R5381 Other malaise: Secondary | ICD-10-CM | POA: Diagnosis not present

## 2012-07-14 MED ORDER — KETOROLAC TROMETHAMINE 60 MG/2ML IJ SOLN
60.0000 mg | Freq: Once | INTRAMUSCULAR | Status: AC
  Administered 2012-07-14: 60 mg via INTRAMUSCULAR

## 2012-07-14 MED ORDER — POTASSIUM CHLORIDE CRYS ER 20 MEQ PO TBCR: EXTENDED_RELEASE_TABLET | ORAL | Status: DC

## 2012-07-14 MED ORDER — PREDNISONE 5 MG PO TABS: 5.0000 mg | ORAL_TABLET | Freq: Two times a day (BID) | ORAL | Status: AC

## 2012-07-14 MED ORDER — METHYLPREDNISOLONE ACETATE 80 MG/ML IJ SUSP
80.0000 mg | Freq: Once | INTRAMUSCULAR | Status: AC
  Administered 2012-07-14: 80 mg via INTRAMUSCULAR

## 2012-07-14 NOTE — Patient Instructions (Addendum)
CPE in 3.5 month, call if you need me before.  Toradol 60mg  and depo medrol 80mg  iM today for back pain . Five days of prednisone twice daily also prescribed.  Potassium is prescribed as tablets per your request  HBA1C, chem 7 , TSH in 2 weeks please, non fasting  Please cut back on smoking, you need to quit  It is important that you exercise regularly at least 30 minutes 5 times a week. If you develop chest pain, have severe difficulty breathing, or feel very tired, stop exercising immediately and seek medical attention   A healthy diet is rich in fruit, vegetables and whole grains. Poultry fish, nuts and beans are a healthy choice for protein rather then red meat. A low sodium diet and drinking 64 ounces of water daily is generally recommended. Oils and sweet should be limited. Carbohydrates especially for those who are diabetic or overweight, should be limited to 30-45 gram per meal. It is important to eat on a regular schedule, at least 3 times daily. Snacks should be primarily fruits, vegetables or nuts.

## 2012-07-15 ENCOUNTER — Telehealth: Payer: Self-pay | Admitting: Family Medicine

## 2012-07-15 DIAGNOSIS — M461 Sacroiliitis, not elsewhere classified: Secondary | ICD-10-CM | POA: Diagnosis not present

## 2012-07-15 DIAGNOSIS — G43009 Migraine without aura, not intractable, without status migrainosus: Secondary | ICD-10-CM | POA: Diagnosis not present

## 2012-07-15 DIAGNOSIS — F17200 Nicotine dependence, unspecified, uncomplicated: Secondary | ICD-10-CM | POA: Diagnosis not present

## 2012-07-15 DIAGNOSIS — R209 Unspecified disturbances of skin sensation: Secondary | ICD-10-CM | POA: Diagnosis not present

## 2012-07-15 NOTE — Telephone Encounter (Signed)
Clarification needed on potassium.  Clarified that med changed from liquid to tablet per patient request.  No change in dose made.

## 2012-07-21 NOTE — Progress Notes (Signed)
  Subjective:    Patient ID: Brooke Maddox, female    DOB: 1971-03-13, 42 y.o.   MRN: [id number]  HPI The PT is here for follow up and re-evaluation of chronic medical conditions, medication management and review of any available recent lab and radiology data.  Preventive health is updated, specifically  Cancer screening and Immunization.   Questions or concerns regarding consultations or procedures which the PT has had in the interim are  addressed. The PT denies any adverse reactions to current medications since the last visit.  8 week h/o increased and uncontrolled back pain, no specific aggravating factor which started this.Radiates down right leg      Review of Systems See HPI Denies recent fever or chills. Denies sinus pressure, nasal congestion, ear pain or sore throat. Denies chest congestion, productive cough or wheezing. Denies chest pains, palpitations and leg swelling Denies abdominal pain, nausea, vomiting,diarrhea or constipation.   Denies dysuria, frequency, hesitancy or incontinence. Denies headaches, seizures, numbness, or tingling. Denies depression, anxiety or insomnia. Denies skin break down or rash.        Objective:   Physical Exam  Patient alert and oriented and in no cardiopulmonary distress.Pt in pain  HEENT: No facial asymmetry, EOMI, no sinus tenderness,  oropharynx pink and moist.  Neck supple no adenopathy.  Chest: Clear to auscultation bilaterally.Decreased air entry throughout  CVS: S1, S2 no murmurs, no S3.  ABD: Soft non tender. Bowel sounds normal.  Ext: No edema  MS: decreased  ROM spine, shoulders, hips and knees.  Skin: Intact, no ulcerations or rash noted.  Psych: Good eye contact, normal affect. Memory intact not anxious or depressed appearing.  CNS: CN 2-12 intact, power,  and sensation normal throughout.       Assessment & Plan:

## 2012-07-21 NOTE — Assessment & Plan Note (Signed)
Unchanged, unwilling to set quit date at this time Patient counseled for approximately 5 minutes regarding the health risks of ongoing nicotine use, specifically all types of cancer, heart disease, stroke and respiratory failure. The options available for help with cessation ,the behavioral changes to assist the process, and the option to either gradully reduce usage  Or abruptly stop.is also discussed. Pt is also encouraged to set specific goals in number of cigarettes used daily, as well as to set a quit date.  

## 2012-07-21 NOTE — Assessment & Plan Note (Signed)
Patient educated about the importance of limiting  Carbohydrate intake , the need to commit to daily physical activity for a minimum of 30 minutes , and to commit weight loss. The fact that changes in all these areas will reduce or eliminate all together the development of diabetes is stressed.   Updated lab needed 

## 2012-07-21 NOTE — Assessment & Plan Note (Signed)
Uncontrolled, anti inflammatories in office followed by short course of oral prednisone

## 2012-07-21 NOTE — Assessment & Plan Note (Signed)
Controlled, no change in medication DASH diet and commitment to daily physical activity for a minimum of 30 minutes discussed and encouraged, as a part of hypertension management. The importance of attaining a healthy weight is also discussed.  

## 2012-07-23 ENCOUNTER — Other Ambulatory Visit: Payer: Self-pay

## 2012-07-23 MED ORDER — LEVOTHYROXINE SODIUM 25 MCG PO TABS: 25.0000 ug | ORAL_TABLET | Freq: Every day | ORAL | Status: DC

## 2012-07-23 MED ORDER — ROSUVASTATIN CALCIUM 20 MG PO TABS: ORAL_TABLET | ORAL | Status: DC

## 2012-07-28 ENCOUNTER — Telehealth: Payer: Self-pay | Admitting: Family Medicine

## 2012-07-28 DIAGNOSIS — R946 Abnormal results of thyroid function studies: Secondary | ICD-10-CM | POA: Diagnosis not present

## 2012-07-28 DIAGNOSIS — R7301 Impaired fasting glucose: Secondary | ICD-10-CM | POA: Diagnosis not present

## 2012-07-28 DIAGNOSIS — I10 Essential (primary) hypertension: Secondary | ICD-10-CM | POA: Diagnosis not present

## 2012-07-28 LAB — HEMOGLOBIN A1C
Hgb A1c MFr Bld: 5.6 % (ref <5.7)
Mean Plasma Glucose: 114 mg/dL (ref <117)

## 2012-07-28 LAB — TSH: TSH: 1.737 u[IU]/mL (ref 0.350–4.500)

## 2012-07-28 LAB — BASIC METABOLIC PANEL
BUN: 6 mg/dL (ref 6–23)
CO2: 29 mEq/L (ref 19–32)
Calcium: 9.4 mg/dL (ref 8.4–10.5)
Chloride: 100 mEq/L (ref 96–112)
Creat: 0.68 mg/dL (ref 0.50–1.10)
Glucose, Bld: 85 mg/dL (ref 70–99)
Potassium: 4.1 mEq/L (ref 3.5–5.3)
Sodium: 135 mEq/L (ref 135–145)

## 2012-07-29 DIAGNOSIS — G609 Hereditary and idiopathic neuropathy, unspecified: Secondary | ICD-10-CM | POA: Diagnosis not present

## 2012-07-29 DIAGNOSIS — M4712 Other spondylosis with myelopathy, cervical region: Secondary | ICD-10-CM | POA: Diagnosis not present

## 2012-07-29 DIAGNOSIS — IMO0002 Reserved for concepts with insufficient information to code with codable children: Secondary | ICD-10-CM | POA: Diagnosis not present

## 2012-07-29 NOTE — Telephone Encounter (Signed)
Noted  

## 2012-08-04 DIAGNOSIS — F3110 Bipolar disorder, current episode manic without psychotic features, unspecified: Secondary | ICD-10-CM | POA: Diagnosis not present

## 2012-08-14 DIAGNOSIS — Z79899 Other long term (current) drug therapy: Secondary | ICD-10-CM | POA: Diagnosis not present

## 2012-08-14 DIAGNOSIS — M545 Low back pain, unspecified: Secondary | ICD-10-CM | POA: Diagnosis not present

## 2012-08-14 DIAGNOSIS — M461 Sacroiliitis, not elsewhere classified: Secondary | ICD-10-CM | POA: Diagnosis not present

## 2012-08-14 DIAGNOSIS — M4712 Other spondylosis with myelopathy, cervical region: Secondary | ICD-10-CM | POA: Diagnosis not present

## 2012-08-15 DIAGNOSIS — M461 Sacroiliitis, not elsewhere classified: Secondary | ICD-10-CM | POA: Diagnosis not present

## 2012-08-21 ENCOUNTER — Other Ambulatory Visit: Payer: Self-pay | Admitting: Neurology

## 2012-08-21 DIAGNOSIS — M4692 Unspecified inflammatory spondylopathy, cervical region: Secondary | ICD-10-CM

## 2012-08-21 DIAGNOSIS — M542 Cervicalgia: Secondary | ICD-10-CM

## 2012-08-22 ENCOUNTER — Ambulatory Visit (HOSPITAL_COMMUNITY)
Admission: RE | Admit: 2012-08-22 | Discharge: 2012-08-22 | Disposition: A | Discharge status: Home / Self Care | Payer: Medicare Other | Source: Ambulatory Visit | Attending: Neurology | Admitting: Neurology

## 2012-08-22 ENCOUNTER — Other Ambulatory Visit: Payer: Self-pay | Admitting: Neurology

## 2012-08-22 DIAGNOSIS — M533 Sacrococcygeal disorders, not elsewhere classified: Secondary | ICD-10-CM | POA: Insufficient documentation

## 2012-08-22 DIAGNOSIS — R209 Unspecified disturbances of skin sensation: Secondary | ICD-10-CM | POA: Insufficient documentation

## 2012-08-22 DIAGNOSIS — M542 Cervicalgia: Secondary | ICD-10-CM | POA: Diagnosis not present

## 2012-08-22 DIAGNOSIS — M461 Sacroiliitis, not elsewhere classified: Secondary | ICD-10-CM

## 2012-08-22 DIAGNOSIS — M47817 Spondylosis without myelopathy or radiculopathy, lumbosacral region: Secondary | ICD-10-CM | POA: Diagnosis not present

## 2012-08-22 DIAGNOSIS — M4692 Unspecified inflammatory spondylopathy, cervical region: Secondary | ICD-10-CM

## 2012-08-22 DIAGNOSIS — M4310 Spondylolisthesis, site unspecified: Secondary | ICD-10-CM | POA: Diagnosis not present

## 2012-08-27 ENCOUNTER — Other Ambulatory Visit: Payer: Self-pay | Admitting: Family Medicine

## 2012-08-27 DIAGNOSIS — N63 Unspecified lump in unspecified breast: Secondary | ICD-10-CM

## 2012-08-27 DIAGNOSIS — M461 Sacroiliitis, not elsewhere classified: Secondary | ICD-10-CM | POA: Diagnosis not present

## 2012-08-27 DIAGNOSIS — Z139 Encounter for screening, unspecified: Secondary | ICD-10-CM

## 2012-08-27 DIAGNOSIS — IMO0002 Reserved for concepts with insufficient information to code with codable children: Secondary | ICD-10-CM

## 2012-08-27 DIAGNOSIS — R609 Edema, unspecified: Secondary | ICD-10-CM

## 2012-08-27 DIAGNOSIS — Z79899 Other long term (current) drug therapy: Secondary | ICD-10-CM | POA: Diagnosis not present

## 2012-08-27 DIAGNOSIS — T148XXA Other injury of unspecified body region, initial encounter: Secondary | ICD-10-CM

## 2012-08-27 DIAGNOSIS — R222 Localized swelling, mass and lump, trunk: Secondary | ICD-10-CM

## 2012-08-31 ENCOUNTER — Other Ambulatory Visit: Payer: Self-pay | Admitting: Family Medicine

## 2012-09-12 DIAGNOSIS — M461 Sacroiliitis, not elsewhere classified: Secondary | ICD-10-CM | POA: Diagnosis not present

## 2012-09-12 DIAGNOSIS — M545 Low back pain, unspecified: Secondary | ICD-10-CM | POA: Diagnosis not present

## 2012-09-12 DIAGNOSIS — Z79899 Other long term (current) drug therapy: Secondary | ICD-10-CM | POA: Diagnosis not present

## 2012-09-12 DIAGNOSIS — M4712 Other spondylosis with myelopathy, cervical region: Secondary | ICD-10-CM | POA: Diagnosis not present

## 2012-09-18 ENCOUNTER — Other Ambulatory Visit: Payer: Self-pay | Admitting: Family Medicine

## 2012-10-09 ENCOUNTER — Ambulatory Visit (HOSPITAL_COMMUNITY): Payer: Medicare Other

## 2012-10-27 ENCOUNTER — Ambulatory Visit: Payer: Medicare Other | Admitting: Family Medicine

## 2012-10-27 DIAGNOSIS — F3110 Bipolar disorder, current episode manic without psychotic features, unspecified: Secondary | ICD-10-CM | POA: Diagnosis not present

## 2012-12-04 ENCOUNTER — Ambulatory Visit (INDEPENDENT_AMBULATORY_CARE_PROVIDER_SITE_OTHER): Payer: Medicare Other | Admitting: Otolaryngology

## 2012-12-04 ENCOUNTER — Encounter: Payer: Self-pay | Admitting: Family Medicine

## 2012-12-04 ENCOUNTER — Ambulatory Visit (INDEPENDENT_AMBULATORY_CARE_PROVIDER_SITE_OTHER): Payer: Medicare Other | Admitting: Family Medicine

## 2012-12-04 VITALS — BP 140/82 | HR 88 | Resp 18 | Ht 62.75 in | Wt 160.0 lb

## 2012-12-04 DIAGNOSIS — L509 Urticaria, unspecified: Secondary | ICD-10-CM | POA: Diagnosis not present

## 2012-12-04 DIAGNOSIS — E785 Hyperlipidemia, unspecified: Secondary | ICD-10-CM | POA: Diagnosis not present

## 2012-12-04 DIAGNOSIS — I10 Essential (primary) hypertension: Secondary | ICD-10-CM

## 2012-12-04 DIAGNOSIS — R5383 Other fatigue: Secondary | ICD-10-CM | POA: Diagnosis not present

## 2012-12-04 DIAGNOSIS — R5381 Other malaise: Secondary | ICD-10-CM | POA: Diagnosis not present

## 2012-12-04 DIAGNOSIS — F17200 Nicotine dependence, unspecified, uncomplicated: Secondary | ICD-10-CM

## 2012-12-04 DIAGNOSIS — R7301 Impaired fasting glucose: Secondary | ICD-10-CM

## 2012-12-04 DIAGNOSIS — R569 Unspecified convulsions: Secondary | ICD-10-CM

## 2012-12-04 DIAGNOSIS — M549 Dorsalgia, unspecified: Secondary | ICD-10-CM

## 2012-12-04 MED ORDER — PREDNISONE (PAK) 5 MG PO TABS: 5.0000 mg | ORAL_TABLET | ORAL | Status: DC

## 2012-12-04 MED ORDER — HYDROXYZINE HCL 25 MG PO TABS: ORAL_TABLET | ORAL | Status: DC

## 2012-12-04 NOTE — Patient Instructions (Addendum)
Pelvic and breast in mid October, call if you need me before  Depo medrol 80 mg IM in the office for hives, followed by prednisone by mouth for 6 days. Hydroxyzine tablets for itching at the pharmacy, sleep in gloves so you don't scratch your skin  Toradol 60mg  IM for pain  If you develop problems with breathing or swallowing you need to go the ED   Please continue to cut back on cigarettes, you need to quit  Fasting lipid, cmp, TSH, cBC, and HBA1C 3 to 5 days before October appt

## 2012-12-04 NOTE — Progress Notes (Signed)
  Subjective:    Patient ID: Brooke Maddox, female    DOB: January 26, 1971, 42 y.o.   MRN: [id number]  HPI 1 day h/o spreading red rash, upper extremities and abdomen, no known insult, first episode. Denies difficulty breathing or swallowing. No wheezing.Rash itches a lot, she was unale to sleeplast night  States she is under increased stress in the past several days to unexpected death of a friend, denies new foods or products. C/o generalized arthritic pain especially in back, requests injection for pain   Review of Systems See HPI Denies recent fever or chills. Denies sinus pressure, nasal congestion, ear pain or sore throat. Denies chest congestion, productive cough or wheezing. Denies chest pains, palpitations and leg swelling Denies abdominal pain, nausea, vomiting,diarrhea or constipation.   Denies dysuria, frequency, hesitancy or incontinence. Denies headaches, seizures, numbness, or tingling.      Objective:   Physical Exam  Patient alert and oriented and in no cardiopulmonary distress.  HEENT: No facial asymmetry, EOMI, no sinus tenderness,  oropharynx pink and moist.  Neck supple no adenopathy.No swelling in oropharyngeal area  Chest: Clear to auscultation bilaterally.decreased though adequate air entry  CVS: S1, S2 no murmurs, no S3.  ABD: Soft non tender. Bowel sounds normal.  Ext: No edema  MS: decreased  ROM spine,adequate in  shoulders, hips and knees.  Skin: Intact, urticaria , raised papular red rash on upper and lower extremities and trunk  Psych: Good eye contact, normal affect. Memory intact mildly anxious and depressed appearing.  CNS: CN 2-12 intact, power, tone and sensation normal throughout.       Assessment & Plan:

## 2012-12-05 DIAGNOSIS — L509 Urticaria, unspecified: Secondary | ICD-10-CM | POA: Diagnosis not present

## 2012-12-05 MED ORDER — KETOROLAC TROMETHAMINE 60 MG/2ML IJ SOLN
60.0000 mg | Freq: Once | INTRAMUSCULAR | Status: AC
  Administered 2012-12-05: 60 mg via INTRAMUSCULAR

## 2012-12-05 MED ORDER — METHYLPREDNISOLONE ACETATE 80 MG/ML IJ SUSP
80.0000 mg | Freq: Once | INTRAMUSCULAR | Status: AC
  Administered 2012-12-04: 80 mg via INTRAMUSCULAR

## 2012-12-07 NOTE — Assessment & Plan Note (Signed)
Unchanged and unwilling to quit at this time, though has been cutting back Patient counseled for approximately 5 minutes regarding the health risks of ongoing nicotine use, specifically all types of cancer, heart disease, stroke and respiratory failure. The options available for help with cessation ,the behavioral changes to assist the process, and the option to either gradully reduce usage  Or abruptly stop.is also discussed. Pt is also encouraged to set specific goals in number of cigarettes used daily, as well as to set a quit date.

## 2012-12-07 NOTE — Assessment & Plan Note (Signed)
Acute flare , toradol in office

## 2012-12-07 NOTE — Assessment & Plan Note (Signed)
Depo medrol in office followed by prednisone Hydroxyzine for itch No evidence of airway obstruction at visit, pt warned of possibility and need to go to ED if this develops

## 2012-12-07 NOTE — Assessment & Plan Note (Signed)
No seizure activity , adequately controlled on med

## 2012-12-07 NOTE — Assessment & Plan Note (Signed)
Sub optimal control, no med change Pt to work on smoking cessation  And lifestyle modification

## 2012-12-07 NOTE — Assessment & Plan Note (Signed)
Updated lab needed Hyperlipidemia:Low fat diet discussed and encouraged.   

## 2013-01-13 ENCOUNTER — Encounter: Payer: Medicare Other | Admitting: Family Medicine

## 2013-01-17 DIAGNOSIS — Z23 Encounter for immunization: Secondary | ICD-10-CM | POA: Diagnosis not present

## 2013-01-20 ENCOUNTER — Other Ambulatory Visit: Payer: Self-pay | Admitting: Family Medicine

## 2013-02-10 ENCOUNTER — Encounter: Payer: Medicare Other | Admitting: Family Medicine

## 2013-02-17 ENCOUNTER — Telehealth: Payer: Self-pay | Admitting: Family Medicine

## 2013-02-17 NOTE — Telephone Encounter (Signed)
Needs an appt in office or with gyne for pelvic pain, pls ex[plain this to her and schedule

## 2013-02-17 NOTE — Telephone Encounter (Signed)
Rescheduled appt for physical

## 2013-02-17 NOTE — Telephone Encounter (Signed)
Also hurting utrine wall dr had to scrap uterine wall no right ovary hurting real bad in right side like it was before please call patient back

## 2013-02-18 NOTE — Telephone Encounter (Signed)
Patient already scheduled for Nov 4th for her physical

## 2013-02-24 ENCOUNTER — Ambulatory Visit (INDEPENDENT_AMBULATORY_CARE_PROVIDER_SITE_OTHER): Payer: Medicare Other | Admitting: Family Medicine

## 2013-02-24 ENCOUNTER — Encounter (INDEPENDENT_AMBULATORY_CARE_PROVIDER_SITE_OTHER): Payer: Self-pay

## 2013-02-24 ENCOUNTER — Other Ambulatory Visit (HOSPITAL_COMMUNITY)
Admission: RE | Admit: 2013-02-24 | Discharge: 2013-02-24 | Disposition: A | Discharge status: Home / Self Care | Payer: Medicare Other | Source: Ambulatory Visit | Attending: Family Medicine | Admitting: Family Medicine

## 2013-02-24 ENCOUNTER — Encounter: Payer: Self-pay | Admitting: Family Medicine

## 2013-02-24 VITALS — BP 130/80 | HR 107 | Resp 18 | Ht 62.75 in | Wt 151.0 lb

## 2013-02-24 DIAGNOSIS — Z124 Encounter for screening for malignant neoplasm of cervix: Secondary | ICD-10-CM | POA: Diagnosis not present

## 2013-02-24 DIAGNOSIS — Z1211 Encounter for screening for malignant neoplasm of colon: Secondary | ICD-10-CM

## 2013-02-24 DIAGNOSIS — R7301 Impaired fasting glucose: Secondary | ICD-10-CM

## 2013-02-24 DIAGNOSIS — R39198 Other difficulties with micturition: Secondary | ICD-10-CM

## 2013-02-24 DIAGNOSIS — M899 Disorder of bone, unspecified: Secondary | ICD-10-CM

## 2013-02-24 DIAGNOSIS — M549 Dorsalgia, unspecified: Secondary | ICD-10-CM | POA: Diagnosis not present

## 2013-02-24 DIAGNOSIS — R9431 Abnormal electrocardiogram [ECG] [EKG]: Secondary | ICD-10-CM

## 2013-02-24 DIAGNOSIS — Z0000 Encounter for general adult medical examination without abnormal findings: Secondary | ICD-10-CM

## 2013-02-24 DIAGNOSIS — Z1151 Encounter for screening for human papillomavirus (HPV): Secondary | ICD-10-CM | POA: Diagnosis not present

## 2013-02-24 DIAGNOSIS — I10 Essential (primary) hypertension: Secondary | ICD-10-CM

## 2013-02-24 DIAGNOSIS — R079 Chest pain, unspecified: Secondary | ICD-10-CM | POA: Diagnosis not present

## 2013-02-24 DIAGNOSIS — E785 Hyperlipidemia, unspecified: Secondary | ICD-10-CM

## 2013-02-24 DIAGNOSIS — R3912 Poor urinary stream: Secondary | ICD-10-CM | POA: Insufficient documentation

## 2013-02-24 DIAGNOSIS — F17200 Nicotine dependence, unspecified, uncomplicated: Secondary | ICD-10-CM

## 2013-02-24 LAB — HEMOCCULT GUIAC POC 1CARD (OFFICE): Fecal Occult Blood, POC: NEGATIVE

## 2013-02-24 MED ORDER — KETOROLAC TROMETHAMINE 60 MG/2ML IM SOLN
60.0000 mg | Freq: Once | INTRAMUSCULAR | Status: AC
  Administered 2013-02-24: 60 mg via INTRAMUSCULAR

## 2013-02-24 MED ORDER — METHYLPREDNISOLONE ACETATE 80 MG/ML IJ SUSP
80.0000 mg | Freq: Once | INTRAMUSCULAR | Status: AC
  Administered 2013-02-24: 80 mg via INTRAMUSCULAR

## 2013-02-24 NOTE — Patient Instructions (Addendum)
F/u in 4 month, call if you need me before  Toradol 60mg  Im and depo medrol 80 mg Im in the office today for back pain.    Rectal exam is normal  EKG today to further evaluate chest pain.You will be referred to cardiology for furhtwer evaluation also  You are referred to Dr Jerre Simon for poor urinary stream  Labs needed as soon as possible , fasting lipid, cmp, hBA1C and vit D and CBC, and H pylori  You need to work on smoking cessation to reduce your risk of heart disease

## 2013-02-24 NOTE — Progress Notes (Signed)
  Subjective:    Patient ID: Sonda Rumble, female    DOB: June 19, 1970, 42 y.o.   MRN: [id number]  HPI Pt in for pelvic and breast exam. C/O increased and uncontrolled back pain, requests anti inflammatory injections at visit today. C/o poor urinary stream, states she has had dilatation of urethra in the past and is requestiing referral for re evaluation for this. Denies dysuria or frequency C/o episode of left chest pain with tightness, shortness of breath and radiating to left arm. She has multiple cardiac risk factors   Review of Systems See HPI Denies recent fever or chills. Denies sinus pressure, nasal congestion, ear pain or sore throat. Denies chest congestion, productive cough or wheezing.Cheronic smoker's cough Denies PND, orthopnea or leg swelling, occasional; palpitations Denies abdominal pain, nausea, vomiting,diarrhea or constipation.   Denies dysuria, frequency,  or incontinence.  Denies headaches, seizures, numbness, or tingling. Denies uncontrolled  depression, anxiety or insomnia. Denies skin break down or rash.        Objective:   Physical Exam  Pleasant well nourished female, alert and oriented x 3, in no cardio-pulmonary distress. Afebrile. HEENT No facial trauma or asymetry. Sinuses non tender.  EOMI, PERTL, fundoscopic exam  no hemorhage or exudate.  External ears normal, tympanic membranes clear. Oropharynx moist, no exudate, poor dentition. Neck: supple, no adenopathy,JVD or thyromegaly.No bruits.  Chest: Clear to ascultation bilaterally.No crackles or wheezes. Non tender to palpation. Decreased air entry throughout, though adequate  Breast: No asymetry,no masses. No nipple discharge or inversion. No axillary or supraclavicular adenopathy  Cardiovascular system; Heart sounds normal,  S1 and  S2 ,no S3.  No murmur, or thrill. Apical beat not displaced Peripheral pulses normal. EKG: t wave inversion, sinus rythm Abdomen: Soft, non tender, no  organomegaly or masses. No bruits. Bowel sounds normal. No guarding, tenderness or rebound.  Rectal:  No mass. Guaiac negative stool.  GU: External genitalia normal. No lesions. Vaginal canal normal.Physiologic  discharge. Uterus absentsize, no adnexal masses, no  adnexal tenderness.  Musculoskeletal exam: Decreased  ROM of spine,adequate in  hips , shoulders and knees. No deformity ,swelling or crepitus noted. No muscle wasting or atrophy.   Neurologic: Cranial nerves 2 to 12 intact. Power, tone ,sensation and reflexes normal throughout. No disturbance in gait. No tremor.  Skin: Intact, no ulceration, erythema , scaling or rash noted. Pigmentation normal throughout  Psych; Normal mood and affect. Judgement and concentration normal       Assessment & Plan:

## 2013-02-25 ENCOUNTER — Other Ambulatory Visit: Payer: Self-pay | Admitting: Family Medicine

## 2013-02-25 ENCOUNTER — Ambulatory Visit (HOSPITAL_COMMUNITY)
Admission: RE | Admit: 2013-02-25 | Discharge: 2013-02-25 | Disposition: A | Discharge status: Home / Self Care | Payer: Medicare Other | Source: Ambulatory Visit | Attending: Family Medicine | Admitting: Family Medicine

## 2013-02-25 DIAGNOSIS — N63 Unspecified lump in unspecified breast: Secondary | ICD-10-CM

## 2013-02-25 DIAGNOSIS — N644 Mastodynia: Secondary | ICD-10-CM | POA: Insufficient documentation

## 2013-02-25 DIAGNOSIS — R922 Inconclusive mammogram: Secondary | ICD-10-CM | POA: Diagnosis not present

## 2013-02-27 ENCOUNTER — Encounter: Payer: Self-pay | Admitting: Cardiology

## 2013-02-27 ENCOUNTER — Encounter: Payer: Self-pay | Admitting: *Deleted

## 2013-02-27 ENCOUNTER — Other Ambulatory Visit: Payer: Self-pay

## 2013-02-27 ENCOUNTER — Ambulatory Visit (INDEPENDENT_AMBULATORY_CARE_PROVIDER_SITE_OTHER): Payer: Medicare Other | Admitting: Cardiology

## 2013-02-27 VITALS — BP 122/78 | HR 78 | Ht 63.0 in | Wt 152.0 lb

## 2013-02-27 DIAGNOSIS — I10 Essential (primary) hypertension: Secondary | ICD-10-CM

## 2013-02-27 DIAGNOSIS — R079 Chest pain, unspecified: Secondary | ICD-10-CM | POA: Diagnosis not present

## 2013-02-27 MED ORDER — OMEPRAZOLE 20 MG PO CPDR: 20.0000 mg | DELAYED_RELEASE_CAPSULE | Freq: Every day | ORAL | Status: DC

## 2013-02-27 MED ORDER — METOPROLOL TARTRATE 25 MG PO TABS: 25.0000 mg | ORAL_TABLET | Freq: Two times a day (BID) | ORAL | Status: DC

## 2013-02-27 MED ORDER — NITROGLYCERIN 0.4 MG SL SUBL: 0.4000 mg | SUBLINGUAL_TABLET | SUBLINGUAL | Status: DC | PRN

## 2013-02-27 NOTE — Progress Notes (Signed)
Clinical Summary Brooke Maddox is a 42 y.o.female referred today for check pain and abnormal EKG  1. Abnormal EKG/Chest pain - describes episodes of chest pain - episode of chest pain Monday while at rest. Feeling of chest tightness midchest, 5/10. +SOB + palps, left arm tingling. Nothing made pain significant better or worst. Pain lasted for 10 minutes. Similar episode on Tuesday. - has had similar episodes x 6 months, typically twice a month. Most recent episodes more severe.  - limited exertion due to chronic hip pain. Tolerates low levels of activity without troubles.    CAD risk factors: HTN, HL, tobacco, father MI age 66    Past Medical History  Diagnosis Date  . Hypertension   . Arthritis   . Depression   . Anxiety associated with depression 2007    hospitalised for mental health problems   . Seizures     Dr. Thomasena Edis   . Migraines   . Chronic back pain   . History of seizure disorder   . History of leukocytosis   . Bipolar affective disorder   . Osteoarthritis     mild   . Abdominal pain, acute, left lower quadrant   . Chronic constipation   . Nausea   . Nicotine addiction   . Obesity   . Fatigue   . Acute knee pain     right   . Hyperlipidemia   . COPD (chronic obstructive pulmonary disease)      Allergies  Allergen Reactions  . Lithium Carbonate Other (See Comments)    Shakes   . Morphine And Related Other (See Comments)    Extended Drowsiness.   . Codeine Nausea And Vomiting  . Divalproex Sodium Rash  . Iodine Rash  . Penicillins Nausea And Vomiting and Rash     Current Outpatient Prescriptions  Medication Sig Dispense Refill  . ALPRAZolam (XANAX) 1 MG tablet Take 1-2 mg by mouth 3 (three) times daily.       . cloNIDine (CATAPRES) 0.1 MG tablet TAKE 1 TABLET BY MOUTH EVERY NIGHT AT BEDTIME  30 tablet  4  . CRESTOR 20 MG tablet TAKE 1 TABLET BY MOUTH EVERY NIGHT AT BEDTIME  30 tablet  1  . FLUoxetine (PROZAC) 40 MG capsule Take 40 mg by mouth  daily.        Marland Kitchen lamoTRIgine (LAMICTAL) 150 MG tablet Take 150 mg by mouth 2 (two) times daily. Take one tablet by mouth two times a day       . levothyroxine (SYNTHROID, LEVOTHROID) 25 MCG tablet TAKE 1 TABLET BY MOUTH DAILY  30 tablet  1  . methocarbamol (ROBAXIN) 500 MG tablet Take 500 mg by mouth 3 (three) times daily as needed. Muscle Spasms      . omeprazole (PRILOSEC) 20 MG capsule Take 1 capsule (20 mg total) by mouth daily.  30 capsule  3  . potassium chloride SA (K-DUR,KLOR-CON) 20 MEQ tablet Two tablets three times daily  180 tablet  11  . QVAR 40 MCG/ACT inhaler INHALE 2 PUFFS TWICE DAILY  8.699999999 g  3  . tiotropium (SPIRIVA HANDIHALER) 18 MCG inhalation capsule Place 1 capsule (18 mcg total) into inhaler and inhale daily.  30 capsule  1   No current facility-administered medications for this visit.     Past Surgical History  Procedure Laterality Date  . Vesicovaginal fistula closure w/ tah  2009     Allergies  Allergen Reactions  . Lithium Carbonate Other (See  Comments)    Shakes   . Morphine And Related Other (See Comments)    Extended Drowsiness.   . Codeine Nausea And Vomiting  . Divalproex Sodium Rash  . Iodine Rash  . Penicillins Nausea And Vomiting and Rash      Family History  Problem Relation Age of Onset  . COPD Father   . Hypertension Father   . Arthritis Brother      Social History Brooke Maddox reports that she has been smoking Cigarettes.  She has a 10 pack-year smoking history. She does not have any smokeless tobacco history on file. Brooke Maddox reports that she does not drink alcohol.   Review of Systems CONSTITUTIONAL: No weight loss, fever, chills, weakness or fatigue.  HEENT: Eyes: No visual loss, blurred vision, double vision or yellow sclerae.No hearing loss, sneezing, congestion, runny nose or sore throat.  SKIN: No rash or itching.  CARDIOVASCULAR: per HPI RESPIRATORY: per HPI GASTROINTESTINAL: No anorexia, nausea, vomiting or diarrhea.  No abdominal pain or blood.  GENITOURINARY: No burning on urination, no polyuria NEUROLOGICAL: No headache, dizziness, syncope, paralysis, ataxia, numbness or tingling in the extremities. No change in bowel or bladder control.  MUSCULOSKELETAL: No muscle, back pain, joint pain or stiffness.  LYMPHATICS: No enlarged nodes. No history of splenectomy.  PSYCHIATRIC: No history of depression or anxiety.  ENDOCRINOLOGIC: No reports of sweating, cold or heat intolerance. No polyuria or polydipsia.  Marland Kitchen   Physical Examination p 78 bp 122/78 Wt 152 lbs BMI 27 Gen: resting comfortably, no acute distress HEENT: no scleral icterus, pupils equal round and reactive, no palptable cervical adenopathy,  CV: RRR, no m/r/g, no JVD, no carotid bruits Resp: Clear to auscultation bilaterally GI: abdomen is soft, non-tender, non-distended, normal bowel sounds, no hepatosplenomegaly MSK: extremities are warm, no edema.  Skin: warm, no rash Neuro:  no focal deficits Psych: appropriate affect   Diagnostic Studies 02/24/13 EKG: sinus rhythm, inverted T-waves anteroseptal and anterolateral precordial leads    Assessment and Plan  1. Chest pain - patient with multiple CAD risk factors, symptoms suggestive of possible cardiac etiology. EKG shows T-wave inversions throughout precordial leads. - she is unable to exercise due to chronic hip pain - will obtain lexiscan to further evaluate ischemia and obtain echocardiogram - started on ASA 81mg  daily as well as metoprolol 25mg  bid as an antianginal, given prn nitroglycerin - counseled if persistent pain that does not resolve with NG to present to ER      Antoine Poche, M.D., F.A.C.C.

## 2013-02-27 NOTE — Patient Instructions (Addendum)
Your physician recommends that you schedule a follow-up appointment in: ONE MONTH  Your physician has requested that you have a lexiscan myoview. For further information please visit [email or web address] Please follow instruction sheet, as given.  Your physician has requested that you have an echocardiogram. Echocardiography is a painless test that uses sound waves to create images of your heart. It provides your doctor with information about the size and shape of your heart and how well your heart's chambers and valves are working. This procedure takes approximately one hour. There are no restrictions for this procedure.  Your physician has recommended you make the following change in your medication:   1) START TAKING ASPIRIN 81MG  ONCE DAILY 2) START TAKING LOPRESSOR 25MG  TWICE DAILY 3) START TAKING NITRO 0.4MG  SUBLINGUAL AS NEEDED FOR CHEST PAIN The proper use and anticipated side effects of nitroglycerine has been carefully explained.  If a single episode of chest pain is not relieved by one tablet, the patient will try another within 5 minutes; and if this doesn't relieve the pain, the patient is instructed to call 911 for transportation to an emergency department.  WE WILL CALL YOU WITH YOUR TEST RESULTS/INSTRUCTIONS/NEXT STEPS ONCE RECEIVED BY THE PROVIDER  PLEASE BE ADVISED YOU WILL STILL NEED TO KEEP YOUR FOLLOW UP APPOINTMENT TO DISCUSS FURTHER DETAILS OF YOUR TEST RESULTS/NEXT STEPS/FUTURE PLAN OF CARE WITH YOUR PROVIDER DESPITE THE FACT THAT YOU MAY HAVE NORMAL TEST RESULTS

## 2013-02-28 DIAGNOSIS — Z0000 Encounter for general adult medical examination without abnormal findings: Secondary | ICD-10-CM | POA: Insufficient documentation

## 2013-02-28 NOTE — Assessment & Plan Note (Signed)
Unchanged. Patient counseled for approximately 5 minutes regarding the health risks of ongoing nicotine use, specifically all types of cancer, heart disease, stroke and respiratory failure. The options available for help with cessation ,the behavioral changes to assist the process, and the option to either gradully reduce usage  Or abruptly stop.is also discussed. Pt is also encouraged to set specific goals in number of cigarettes used daily, as well as to set a quit date.  

## 2013-02-28 NOTE — Assessment & Plan Note (Signed)
Uncontrolled, toradol and depo medrol at visit

## 2013-02-28 NOTE — Assessment & Plan Note (Addendum)
chest pain at rest 1 day ago radiating to left arm, with multiple CV risk factors. Tobacco cessation counseling stressed. Office EKG  At visit is abnormal with t wave inversion, refer to cardiology

## 2013-02-28 NOTE — Assessment & Plan Note (Signed)
Pelvic and breast exam as documented. Pap sent and stool tested and is heme negative Preventive health counseling also done , espescialy in light of c/o new chest pain with abnormal EKG

## 2013-02-28 NOTE — Assessment & Plan Note (Signed)
Reports poor urinary stream, and that she has benefited in the past from urethral dilatation in the past, will refer to urologist she has seen in the past

## 2013-03-02 ENCOUNTER — Encounter: Payer: Self-pay | Admitting: Cardiology

## 2013-03-05 ENCOUNTER — Ambulatory Visit (INDEPENDENT_AMBULATORY_CARE_PROVIDER_SITE_OTHER): Payer: Medicare Other | Admitting: Otolaryngology

## 2013-03-09 ENCOUNTER — Encounter (HOSPITAL_COMMUNITY)
Admission: RE | Admit: 2013-03-09 | Discharge: 2013-03-09 | Disposition: A | Discharge status: Home / Self Care | Payer: Medicare Other | Source: Ambulatory Visit | Attending: Cardiology | Admitting: Cardiology

## 2013-03-09 ENCOUNTER — Encounter (HOSPITAL_COMMUNITY): Payer: Self-pay

## 2013-03-09 ENCOUNTER — Ambulatory Visit (HOSPITAL_COMMUNITY)
Admission: RE | Admit: 2013-03-09 | Discharge: 2013-03-09 | Disposition: A | Discharge status: Home / Self Care | Payer: Medicare Other | Source: Ambulatory Visit | Attending: Cardiology | Admitting: Cardiology

## 2013-03-09 DIAGNOSIS — R9431 Abnormal electrocardiogram [ECG] [EKG]: Secondary | ICD-10-CM | POA: Insufficient documentation

## 2013-03-09 DIAGNOSIS — I10 Essential (primary) hypertension: Secondary | ICD-10-CM

## 2013-03-09 DIAGNOSIS — I519 Heart disease, unspecified: Secondary | ICD-10-CM

## 2013-03-09 DIAGNOSIS — R079 Chest pain, unspecified: Secondary | ICD-10-CM | POA: Insufficient documentation

## 2013-03-09 HISTORY — DX: Systemic involvement of connective tissue, unspecified: M35.9

## 2013-03-09 HISTORY — DX: Disorder of kidney and ureter, unspecified: N28.9

## 2013-03-09 MED ORDER — TECHNETIUM TC 99M SESTAMIBI - CARDIOLITE
30.0000 | Freq: Once | INTRAVENOUS | Status: AC | PRN
  Administered 2013-03-09: 11:00:00 30 via INTRAVENOUS

## 2013-03-09 MED ORDER — SODIUM CHLORIDE 0.9 % IJ SOLN
INTRAMUSCULAR | Status: AC
  Administered 2013-03-09: 10 mL via INTRAVENOUS
  Filled 2013-03-09: qty 10

## 2013-03-09 MED ORDER — REGADENOSON 0.4 MG/5ML IV SOLN
INTRAVENOUS | Status: AC
  Administered 2013-03-09: 0.4 mg via INTRAVENOUS
  Filled 2013-03-09: qty 5

## 2013-03-09 MED ORDER — TECHNETIUM TC 99M SESTAMIBI - CARDIOLITE
10.0000 | Freq: Once | INTRAVENOUS | Status: AC | PRN
  Administered 2013-03-09: 10 via INTRAVENOUS

## 2013-03-09 NOTE — Progress Notes (Signed)
*  PRELIMINARY RESULTS* Echocardiogram 2D Echocardiogram has been performed.  Brooke Maddox 03/09/2013, 11:36 AM

## 2013-03-09 NOTE — Progress Notes (Signed)
Stress Lab Nurses Notes - [facility]  Teliyah GIULIANA HANDYSIDE 03/09/2013 Reason for doing test: Chest Pain Type of test: Marlane Hatcher Nurse performing test: Parke Poisson, RN Nuclear Medicine Tech: Lyndel Pleasure Echo Tech: Not Applicable MD performing test: McDowell/K.LawrenceNP Family MD: Lodema Hong Test explained and consent signed: yes IV started: 22g jelco, Saline lock flushed, No redness or edema and Saline lock started in radiology Symptoms: Chest pain Treatment/Intervention: None Reason test stopped: protocol completed After recovery IV was: Discontinued via X-ray tech and No redness or edema Patient to return to Nuc. Med at : 11:00 Patient discharged: Home Patient's Condition upon discharge was: stable Comments: During test BP 100/65 & HR 76.  Recovery BP 86/64 & HR 62.  Symptoms resolved in recovery. Erskine Speed T

## 2013-03-10 ENCOUNTER — Telehealth: Payer: Self-pay | Admitting: *Deleted

## 2013-03-10 DIAGNOSIS — L509 Urticaria, unspecified: Secondary | ICD-10-CM

## 2013-03-10 DIAGNOSIS — R079 Chest pain, unspecified: Secondary | ICD-10-CM

## 2013-03-10 DIAGNOSIS — J449 Chronic obstructive pulmonary disease, unspecified: Secondary | ICD-10-CM

## 2013-03-10 DIAGNOSIS — Z01818 Encounter for other preprocedural examination: Secondary | ICD-10-CM

## 2013-03-10 DIAGNOSIS — E785 Hyperlipidemia, unspecified: Secondary | ICD-10-CM

## 2013-03-10 DIAGNOSIS — R5381 Other malaise: Secondary | ICD-10-CM

## 2013-03-10 DIAGNOSIS — I10 Essential (primary) hypertension: Secondary | ICD-10-CM

## 2013-03-10 NOTE — Telephone Encounter (Signed)
Placed orders in the chart for pt to have pre cath labs drawn, pt also needs to have a chest x ray per protocol to have one in the last year, order placed in chart for xray, left a message for the pt to call me back to clarify her availability for the Cath

## 2013-03-10 NOTE — Telephone Encounter (Signed)
Message copied by Ovidio Kin on Tue Mar 10, 2013  4:32 PM ------      Message from: Loomis F      Created: Tue Mar 10, 2013 12:50 PM      Regarding: Cath       Selena Batten, please arrange a left heart cath with coronary angiography for this patient Brooke Maddox. Any interventionalist is fine, preferably in the next week or so. She has a contrast dye allergy show we will need to coordinate starting her on predisone and benadryl prior to the cath once we have the date. Indication for cath is chest pain, abnormal stress test.                   Dina Rich MD ------

## 2013-03-11 ENCOUNTER — Encounter: Payer: Self-pay | Admitting: *Deleted

## 2013-03-11 ENCOUNTER — Other Ambulatory Visit: Payer: Self-pay | Admitting: Cardiology

## 2013-03-11 DIAGNOSIS — R079 Chest pain, unspecified: Secondary | ICD-10-CM

## 2013-03-11 MED ORDER — FAMOTIDINE 20 MG PO TABS: ORAL_TABLET | ORAL | Status: DC

## 2013-03-11 MED ORDER — PREDNISONE 20 MG PO TABS: ORAL_TABLET | ORAL | Status: DC

## 2013-03-11 NOTE — Telephone Encounter (Signed)
Pt advised that she could not have the CATH performed until after her apt to establish with behavioral health MD on 03-16-13, pt educated about importance to have this performed asap, pt still declined, apt scheduled for 03-17-13 at 11am with Dr [place], pt will need to arrive at 9am at [facility], per possible allergy with contrast pt advised she will need to take the following medication prior to her CATH  PREDNISONE 60MG  (ORAL) 18 HOURS PRIOR TO HER CATH PEPCID 20MG  (ORAL) 12 HOURS PRIOR TO HER CATH  Pt also advised an additional dosage of Prednisone and Pepcid will be administered via IV at [facility] prior to her CATH, pt understood RX sent to pt pharmacy via escribe, pt aware she will need to have labs and chest x ray performed asap, Orders placed as STAT to allow time for results to come in prior to Cath, pt understood and will pick up her CATH instructions placed at the front desk for her pick up, Dr Wyline Mood made aware to place orders for Cath/prednisone/pepcid in pt chart

## 2013-03-12 ENCOUNTER — Ambulatory Visit (HOSPITAL_COMMUNITY)
Admission: RE | Admit: 2013-03-12 | Discharge: 2013-03-12 | Disposition: A | Discharge status: Home / Self Care | Payer: Medicare Other | Source: Ambulatory Visit | Attending: Cardiology | Admitting: Cardiology

## 2013-03-12 DIAGNOSIS — R079 Chest pain, unspecified: Secondary | ICD-10-CM | POA: Diagnosis not present

## 2013-03-12 DIAGNOSIS — J449 Chronic obstructive pulmonary disease, unspecified: Secondary | ICD-10-CM

## 2013-03-12 DIAGNOSIS — I10 Essential (primary) hypertension: Secondary | ICD-10-CM | POA: Diagnosis not present

## 2013-03-12 DIAGNOSIS — R5381 Other malaise: Secondary | ICD-10-CM

## 2013-03-12 DIAGNOSIS — L509 Urticaria, unspecified: Secondary | ICD-10-CM

## 2013-03-12 DIAGNOSIS — Z01818 Encounter for other preprocedural examination: Secondary | ICD-10-CM

## 2013-03-12 DIAGNOSIS — E785 Hyperlipidemia, unspecified: Secondary | ICD-10-CM

## 2013-03-12 LAB — CBC WITH DIFFERENTIAL/PLATELET
Basophils Absolute: 0.1 10*3/uL (ref 0.0–0.1)
Basophils Relative: 1 % (ref 0–1)
Eosinophils Absolute: 0.4 10*3/uL (ref 0.0–0.7)
Eosinophils Relative: 3 % (ref 0–5)
HCT: 41.5 % (ref 36.0–46.0)
Hemoglobin: 14.7 g/dL (ref 12.0–15.0)
Lymphocytes Relative: 40 % (ref 12–46)
Lymphs Abs: 5.5 10*3/uL — ABNORMAL HIGH (ref 0.7–4.0)
MCH: 33.3 pg (ref 26.0–34.0)
MCHC: 35.4 g/dL (ref 30.0–36.0)
MCV: 94.1 fL (ref 78.0–100.0)
Monocytes Absolute: 1.2 10*3/uL — ABNORMAL HIGH (ref 0.1–1.0)
Monocytes Relative: 9 % (ref 3–12)
Neutro Abs: 6.5 10*3/uL (ref 1.7–7.7)
Neutrophils Relative %: 47 % (ref 43–77)
Platelets: 391 10*3/uL (ref 150–400)
RBC: 4.41 MIL/uL (ref 3.87–5.11)
RDW: 14.2 % (ref 11.5–15.5)
WBC: 13.6 10*3/uL — ABNORMAL HIGH (ref 4.0–10.5)

## 2013-03-12 LAB — BASIC METABOLIC PANEL
BUN: 13 mg/dL (ref 6–23)
CO2: 26 mEq/L (ref 19–32)
Calcium: 9.6 mg/dL (ref 8.4–10.5)
Chloride: 102 mEq/L (ref 96–112)
Creat: 0.7 mg/dL (ref 0.50–1.10)
Glucose, Bld: 102 mg/dL — ABNORMAL HIGH (ref 70–99)
Potassium: 4.4 mEq/L (ref 3.5–5.3)
Sodium: 135 mEq/L (ref 135–145)

## 2013-03-12 LAB — PROTIME-INR
INR: 0.9 (ref <1.50)
Prothrombin Time: 12.2 seconds (ref 11.6–15.2)

## 2013-03-12 LAB — APTT: aPTT: 36 seconds (ref 24–37)

## 2013-03-13 ENCOUNTER — Encounter (HOSPITAL_COMMUNITY): Payer: Self-pay | Admitting: Pharmacy Technician

## 2013-03-16 ENCOUNTER — Other Ambulatory Visit: Payer: Self-pay | Admitting: Family Medicine

## 2013-03-16 DIAGNOSIS — F3110 Bipolar disorder, current episode manic without psychotic features, unspecified: Secondary | ICD-10-CM | POA: Diagnosis not present

## 2013-03-17 ENCOUNTER — Ambulatory Visit (HOSPITAL_COMMUNITY): Admission: RE | Admit: 2013-03-17 | Payer: Medicare Other | Source: Ambulatory Visit | Admitting: Cardiology

## 2013-03-17 ENCOUNTER — Encounter (HOSPITAL_COMMUNITY)
Admission: RE | Disposition: A | Discharge status: Home / Self Care | Payer: Self-pay | Source: Ambulatory Visit | Attending: Cardiology

## 2013-03-17 ENCOUNTER — Ambulatory Visit (HOSPITAL_COMMUNITY)
Admission: RE | Admit: 2013-03-17 | Discharge: 2013-03-17 | Disposition: A | Discharge status: Home / Self Care | Payer: Medicare Other | Source: Ambulatory Visit | Attending: Cardiology | Admitting: Cardiology

## 2013-03-17 ENCOUNTER — Encounter (HOSPITAL_COMMUNITY): Admission: RE | Payer: Self-pay | Source: Ambulatory Visit

## 2013-03-17 DIAGNOSIS — J449 Chronic obstructive pulmonary disease, unspecified: Secondary | ICD-10-CM | POA: Insufficient documentation

## 2013-03-17 DIAGNOSIS — R079 Chest pain, unspecified: Secondary | ICD-10-CM | POA: Diagnosis not present

## 2013-03-17 DIAGNOSIS — I10 Essential (primary) hypertension: Secondary | ICD-10-CM | POA: Insufficient documentation

## 2013-03-17 DIAGNOSIS — R0602 Shortness of breath: Secondary | ICD-10-CM | POA: Diagnosis not present

## 2013-03-17 DIAGNOSIS — J4489 Other specified chronic obstructive pulmonary disease: Secondary | ICD-10-CM | POA: Insufficient documentation

## 2013-03-17 DIAGNOSIS — R9431 Abnormal electrocardiogram [ECG] [EKG]: Secondary | ICD-10-CM | POA: Diagnosis not present

## 2013-03-17 DIAGNOSIS — E785 Hyperlipidemia, unspecified: Secondary | ICD-10-CM | POA: Insufficient documentation

## 2013-03-17 HISTORY — PX: LEFT HEART CATHETERIZATION WITH CORONARY ANGIOGRAM: SHX5451

## 2013-03-17 SURGERY — LEFT HEART CATHETERIZATION WITH CORONARY ANGIOGRAM
Anesthesia: Moderate Sedation

## 2013-03-17 SURGERY — LEFT HEART CATHETERIZATION WITH CORONARY ANGIOGRAM
Anesthesia: LOCAL

## 2013-03-17 MED ORDER — SODIUM CHLORIDE 0.9 % IJ SOLN: 3.0000 mL | INTRAMUSCULAR | Status: DC | PRN

## 2013-03-17 MED ORDER — PREDNISONE 20 MG PO TABS
60.0000 mg | ORAL_TABLET | ORAL | Status: DC
  Filled 2013-03-17: qty 3

## 2013-03-17 MED ORDER — NITROGLYCERIN 0.2 MG/ML ON CALL CATH LAB
INTRAVENOUS | Status: AC
  Filled 2013-03-17: qty 1

## 2013-03-17 MED ORDER — HEPARIN (PORCINE) IN NACL 2-0.9 UNIT/ML-% IJ SOLN
INTRAMUSCULAR | Status: AC
  Filled 2013-03-17: qty 1500

## 2013-03-17 MED ORDER — SODIUM CHLORIDE 0.9 % IV SOLN: 1.0000 mL/kg/h | INTRAVENOUS | Status: DC

## 2013-03-17 MED ORDER — SODIUM CHLORIDE 0.9 % IJ SOLN: 3.0000 mL | Freq: Two times a day (BID) | INTRAMUSCULAR | Status: DC

## 2013-03-17 MED ORDER — ACETAMINOPHEN 325 MG PO TABS: 650.0000 mg | ORAL_TABLET | ORAL | Status: DC | PRN

## 2013-03-17 MED ORDER — DIPHENHYDRAMINE HCL 50 MG/ML IJ SOLN
25.0000 mg | INTRAMUSCULAR | Status: AC
  Administered 2013-03-17: 25 mg via INTRAVENOUS
  Filled 2013-03-17: qty 1

## 2013-03-17 MED ORDER — FAMOTIDINE IN NACL 20-0.9 MG/50ML-% IV SOLN
20.0000 mg | INTRAVENOUS | Status: AC
  Administered 2013-03-17: 20 mg via INTRAVENOUS
  Filled 2013-03-17: qty 50

## 2013-03-17 MED ORDER — MIDAZOLAM HCL 2 MG/2ML IJ SOLN
INTRAMUSCULAR | Status: AC
  Filled 2013-03-17: qty 2

## 2013-03-17 MED ORDER — ASPIRIN 81 MG PO CHEW
81.0000 mg | CHEWABLE_TABLET | ORAL | Status: DC
  Filled 2013-03-17: qty 1

## 2013-03-17 MED ORDER — SODIUM CHLORIDE 0.9 % IV SOLN
INTRAVENOUS | Status: DC
  Administered 2013-03-17: 10:00:00 via INTRAVENOUS

## 2013-03-17 MED ORDER — ONDANSETRON HCL 4 MG/2ML IJ SOLN: 4.0000 mg | Freq: Four times a day (QID) | INTRAMUSCULAR | Status: DC | PRN

## 2013-03-17 MED ORDER — FENTANYL CITRATE 0.05 MG/ML IJ SOLN
INTRAMUSCULAR | Status: AC
  Filled 2013-03-17: qty 2

## 2013-03-17 MED ORDER — SODIUM CHLORIDE 0.9 % IV SOLN: 250.0000 mL | INTRAVENOUS | Status: DC | PRN

## 2013-03-17 MED ORDER — LIDOCAINE HCL (PF) 1 % IJ SOLN
INTRAMUSCULAR | Status: AC
  Filled 2013-03-17: qty 30

## 2013-03-17 MED ORDER — PREDNISONE 20 MG PO TABS
60.0000 mg | ORAL_TABLET | ORAL | Status: AC
  Administered 2013-03-17: 60 mg via ORAL
  Filled 2013-03-17: qty 3

## 2013-03-17 MED ORDER — VERAPAMIL HCL 2.5 MG/ML IV SOLN
INTRAVENOUS | Status: AC
  Filled 2013-03-17: qty 2

## 2013-03-17 MED ORDER — ACETAMINOPHEN 325 MG PO TABS
650.0000 mg | ORAL_TABLET | Freq: Four times a day (QID) | ORAL | Status: DC | PRN
  Administered 2013-03-17: 650 mg via ORAL
  Filled 2013-03-17 (×2): qty 2

## 2013-03-17 NOTE — H&P (View-Only) (Signed)
   Clinical Summary Brooke Maddox is a 42 y.o.female referred today for check pain and abnormal EKG  1. Abnormal EKG/Chest pain - describes episodes of chest pain - episode of chest pain Monday while at rest. Feeling of chest tightness midchest, 5/10. +SOB + palps, left arm tingling. Nothing made pain significant better or worst. Pain lasted for 10 minutes. Similar episode on Tuesday. - has had similar episodes x 6 months, typically twice a month. Most recent episodes more severe.  - limited exertion due to chronic hip pain. Tolerates low levels of activity without troubles.    CAD risk factors: HTN, HL, tobacco, father MI age 45    Past Medical History  Diagnosis Date  . Hypertension   . Arthritis   . Depression   . Anxiety associated with depression 2007    hospitalised for mental health problems   . Seizures     Dr. Collins   . Migraines   . Chronic back pain   . History of seizure disorder   . History of leukocytosis   . Bipolar affective disorder   . Osteoarthritis     mild   . Abdominal pain, acute, left lower quadrant   . Chronic constipation   . Nausea   . Nicotine addiction   . Obesity   . Fatigue   . Acute knee pain     right   . Hyperlipidemia   . COPD (chronic obstructive pulmonary disease)      Allergies  Allergen Reactions  . Lithium Carbonate Other (See Comments)    Shakes   . Morphine And Related Other (See Comments)    Extended Drowsiness.   . Codeine Nausea And Vomiting  . Divalproex Sodium Rash  . Iodine Rash  . Penicillins Nausea And Vomiting and Rash     Current Outpatient Prescriptions  Medication Sig Dispense Refill  . ALPRAZolam (XANAX) 1 MG tablet Take 1-2 mg by mouth 3 (three) times daily.       . cloNIDine (CATAPRES) 0.1 MG tablet TAKE 1 TABLET BY MOUTH EVERY NIGHT AT BEDTIME  30 tablet  4  . CRESTOR 20 MG tablet TAKE 1 TABLET BY MOUTH EVERY NIGHT AT BEDTIME  30 tablet  1  . FLUoxetine (PROZAC) 40 MG capsule Take 40 mg by mouth  daily.        . lamoTRIgine (LAMICTAL) 150 MG tablet Take 150 mg by mouth 2 (two) times daily. Take one tablet by mouth two times a day       . levothyroxine (SYNTHROID, LEVOTHROID) 25 MCG tablet TAKE 1 TABLET BY MOUTH DAILY  30 tablet  1  . methocarbamol (ROBAXIN) 500 MG tablet Take 500 mg by mouth 3 (three) times daily as needed. Muscle Spasms      . omeprazole (PRILOSEC) 20 MG capsule Take 1 capsule (20 mg total) by mouth daily.  30 capsule  3  . potassium chloride SA (K-DUR,KLOR-CON) 20 MEQ tablet Two tablets three times daily  180 tablet  11  . QVAR 40 MCG/ACT inhaler INHALE 2 PUFFS TWICE DAILY  8.699999999 g  3  . tiotropium (SPIRIVA HANDIHALER) 18 MCG inhalation capsule Place 1 capsule (18 mcg total) into inhaler and inhale daily.  30 capsule  1   No current facility-administered medications for this visit.     Past Surgical History  Procedure Laterality Date  . Vesicovaginal fistula closure w/ tah  2009     Allergies  Allergen Reactions  . Lithium Carbonate Other (See   Comments)    Shakes   . Morphine And Related Other (See Comments)    Extended Drowsiness.   . Codeine Nausea And Vomiting  . Divalproex Sodium Rash  . Iodine Rash  . Penicillins Nausea And Vomiting and Rash      Family History  Problem Relation Age of Onset  . COPD Father   . Hypertension Father   . Arthritis Brother      Social History Ms. Gow reports that she has been smoking Cigarettes.  She has a 10 pack-year smoking history. She does not have any smokeless tobacco history on file. Ms. Breeland reports that she does not drink alcohol.   Review of Systems CONSTITUTIONAL: No weight loss, fever, chills, weakness or fatigue.  HEENT: Eyes: No visual loss, blurred vision, double vision or yellow sclerae.No hearing loss, sneezing, congestion, runny nose or sore throat.  SKIN: No rash or itching.  CARDIOVASCULAR: per HPI RESPIRATORY: per HPI GASTROINTESTINAL: No anorexia, nausea, vomiting or diarrhea.  No abdominal pain or blood.  GENITOURINARY: No burning on urination, no polyuria NEUROLOGICAL: No headache, dizziness, syncope, paralysis, ataxia, numbness or tingling in the extremities. No change in bowel or bladder control.  MUSCULOSKELETAL: No muscle, back pain, joint pain or stiffness.  LYMPHATICS: No enlarged nodes. No history of splenectomy.  PSYCHIATRIC: No history of depression or anxiety.  ENDOCRINOLOGIC: No reports of sweating, cold or heat intolerance. No polyuria or polydipsia.  .   Physical Examination p 78 bp 122/78 Wt 152 lbs BMI 27 Gen: resting comfortably, no acute distress HEENT: no scleral icterus, pupils equal round and reactive, no palptable cervical adenopathy,  CV: RRR, no m/r/g, no JVD, no carotid bruits Resp: Clear to auscultation bilaterally GI: abdomen is soft, non-tender, non-distended, normal bowel sounds, no hepatosplenomegaly MSK: extremities are warm, no edema.  Skin: warm, no rash Neuro:  no focal deficits Psych: appropriate affect   Diagnostic Studies 02/24/13 EKG: sinus rhythm, inverted T-waves anteroseptal and anterolateral precordial leads    Assessment and Plan  1. Chest pain - patient with multiple CAD risk factors, symptoms suggestive of possible cardiac etiology. EKG shows T-wave inversions throughout precordial leads. - she is unable to exercise due to chronic hip pain - will obtain lexiscan to further evaluate ischemia and obtain echocardiogram - started on ASA 81mg daily as well as metoprolol 25mg bid as an antianginal, given prn nitroglycerin - counseled if persistent pain that does not resolve with NG to present to ER      Shahmeer Bunn F. Cresencia Asmus, M.D., F.A.C.C.  

## 2013-03-17 NOTE — CV Procedure (Signed)
    Cardiac Catheterization Procedure Note  Name: Brooke Maddox MRN: [id number] DOB: 10/18/1970  Procedure: Left Heart Cath, Selective Coronary Angiography, LV angiography  Indication: 42 yo WF with chest pain and abnormal myoview showing a fixed antero-apical defect.   Procedural Details: The right wrist was prepped, draped, and anesthetized with 1% lidocaine. Using the modified Seldinger technique, a 5 French sheath was introduced into the right radial artery. 3 mg of verapamil was administered through the sheath, weight-based unfractionated heparin was administered intravenously. Standard Judkins catheters were used for selective coronary angiography and left ventriculography. Catheter exchanges were performed over an exchange length guidewire. There were no immediate procedural complications. A TR band was used for radial hemostasis at the completion of the procedure.  The patient was transferred to the post catheterization recovery area for further monitoring.  Procedural Findings: Hemodynamics: AO 104/56 mean 79 mm Hg LV 103/12 mm Hg  Coronary angiography: Coronary dominance: left  Left mainstem: Normal  Left anterior descending (LAD): Normal  Left circumflex (LCx): large dominant vessel gives rise to 2 OMs, 2 PLOMs, and PDA. It is normal throughout.  Right coronary artery (RCA): Normal.  Left ventriculography: Left ventricular systolic function is normal, LVEF is estimated at 55-65%, there is no significant mitral regurgitation   Final Conclusions:   1. Normal coronary anatomy. 2. Normal LV function.  Recommendations: consider noncardiac causes of chest pain.  Theron Arista [facility] 03/17/2013, 12:32 PM

## 2013-03-17 NOTE — Interval H&P Note (Signed)
History and Physical Interval Note:  03/17/2013 12:07 PM  Brooke Maddox  has presented today for surgery, with the diagnosis of chest pain  The various methods of treatment have been discussed with the patient and family. After consideration of risks, benefits and other options for treatment, the patient has consented to  Procedure(s): LEFT HEART CATHETERIZATION WITH CORONARY ANGIOGRAM (N/A) as a surgical intervention .  The patient's history has been reviewed, patient examined, no change in status, stable for surgery.  I have reviewed the patient's chart and labs.  Questions were answered to the patient's satisfaction.   Cath Lab Visit (complete for each Cath Lab visit)  Clinical Evaluation Leading to the Procedure:   ACS: no  Non-ACS:    Anginal Classification: CCS III  Anti-ischemic medical therapy: Minimal Therapy (1 class of medications)  Non-Invasive Test Results: Intermediate-risk stress test findings: cardiac mortality 1-3%/year  Prior CABG: No previous CABG        Theron Arista [facility] 03/17/2013 12:07 PM

## 2013-04-06 ENCOUNTER — Encounter: Payer: Self-pay | Admitting: Cardiology

## 2013-04-06 ENCOUNTER — Ambulatory Visit (INDEPENDENT_AMBULATORY_CARE_PROVIDER_SITE_OTHER): Payer: Medicare Other | Admitting: Cardiology

## 2013-04-06 VITALS — BP 104/66 | HR 55 | Ht 62.0 in | Wt 151.0 lb

## 2013-04-06 DIAGNOSIS — R079 Chest pain, unspecified: Secondary | ICD-10-CM | POA: Diagnosis not present

## 2013-04-06 DIAGNOSIS — I10 Essential (primary) hypertension: Secondary | ICD-10-CM | POA: Diagnosis not present

## 2013-04-06 MED ORDER — PANTOPRAZOLE SODIUM 20 MG PO TBEC: 20.0000 mg | DELAYED_RELEASE_TABLET | Freq: Every day | ORAL | Status: DC

## 2013-04-06 NOTE — Progress Notes (Signed)
Clinical Summary Brooke Maddox is a 42 y.o.female seen today for follow up for chest pain   1. Abnormal EKG/Chest pain  - patient seen previously for chest pain - Multiple CAD risk factors: HTN, HL, tobacco, father MI age 76 - Stress MPI technically difficult, suggestion of possible defects. Referred for cath 03/17/13 with patent coronaries.  - since last visit denies any change in symptoms.   Past Medical History  Diagnosis Date  . Hypertension   . Arthritis   . Depression   . Anxiety associated with depression 2007    hospitalised for mental health problems   . Seizures     Dr. Thomasena Edis   . Migraines   . Chronic back pain   . History of seizure disorder   . History of leukocytosis   . Bipolar affective disorder   . Osteoarthritis     mild   . Abdominal pain, acute, left lower quadrant   . Chronic constipation   . Nausea   . Nicotine addiction   . Obesity   . Fatigue   . Acute knee pain     right   . Hyperlipidemia   . COPD (chronic obstructive pulmonary disease)   . Collagen vascular disease   . Renal insufficiency      Allergies  Allergen Reactions  . Dilaudid [Hydromorphone]     Gives patient a huge headache  . Lithium Carbonate Other (See Comments)    Shakes   . Morphine And Related Other (See Comments)    Extended Drowsiness.   . Codeine Nausea And Vomiting  . Divalproex Sodium Rash  . Iodine Rash  . Penicillins Nausea And Vomiting and Rash     Current Outpatient Prescriptions  Medication Sig Dispense Refill  . ALPRAZolam (XANAX) 1 MG tablet Take 1 mg by mouth 3 (three) times daily.       Marland Kitchen aspirin 81 MG chewable tablet Chew 81 mg by mouth daily.      . beclomethasone (QVAR) 40 MCG/ACT inhaler Inhale 1 puff into the lungs 2 (two) times daily.      . cloNIDine (CATAPRES) 0.1 MG tablet Take 0.1 mg by mouth at bedtime.      . cloNIDine (CATAPRES) 0.1 MG tablet TAKE 1 TABLET BY MOUTH EVERY NIGHT AT BEDTIME  30 tablet  2  . famotidine (PEPCID) 20 MG  tablet PT TO TAKE ONE 20MG  TABLET OF PEPCID 12 HOURS PRIOR TO HER CATH 03-17-13 AT 11AM  1 tablet  0  . FLUoxetine (PROZAC) 40 MG capsule Take 40 mg by mouth every morning.      . lamoTRIgine (LAMICTAL) 150 MG tablet Take 300 mg by mouth every morning.       Marland Kitchen levothyroxine (SYNTHROID, LEVOTHROID) 25 MCG tablet Take 25 mcg by mouth daily before breakfast.      . metoprolol tartrate (LOPRESSOR) 25 MG tablet Take 25 mg by mouth 2 (two) times daily.      . nitroGLYCERIN (NITROSTAT) 0.4 MG SL tablet Place 0.4 mg under the tongue every 5 (five) minutes as needed for chest pain.      . potassium chloride SA (K-DUR,KLOR-CON) 20 MEQ tablet Take 40 mEq by mouth 3 (three) times daily.      . predniSONE (DELTASONE) 20 MG tablet PT TO TAKE 60MG  TOTAL (3 OF THE 20MG  TABLETS AT ONE TIME) 18 HOURS PRIOR TO CATH 03-17-13 11AM  3 tablet  0  . rosuvastatin (CRESTOR) 20 MG tablet Take 20 mg by  mouth at bedtime.      Marland Kitchen tiotropium (SPIRIVA) 18 MCG inhalation capsule Place 18 mcg into inhaler and inhale daily.       No current facility-administered medications for this visit.     Past Surgical History  Procedure Laterality Date  . Vesicovaginal fistula closure w/ tah  2009     Allergies  Allergen Reactions  . Dilaudid [Hydromorphone]     Gives patient a huge headache  . Lithium Carbonate Other (See Comments)    Shakes   . Morphine And Related Other (See Comments)    Extended Drowsiness.   . Codeine Nausea And Vomiting  . Divalproex Sodium Rash  . Iodine Rash  . Penicillins Nausea And Vomiting and Rash      Family History  Problem Relation Age of Onset  . COPD Father   . Hypertension Father   . Arthritis Brother      Social History Ms. Duncanson reports that she has been smoking Cigarettes.  She has a 10 pack-year smoking history. She does not have any smokeless tobacco history on file. Ms. Boxer reports that she does not drink alcohol.   Review of Systems CONSTITUTIONAL: No weight loss, fever,  chills, weakness or fatigue.  HEENT: Eyes: No visual loss, blurred vision, double vision or yellow sclerae.No hearing loss, sneezing, congestion, runny nose or sore throat.  SKIN: No rash or itching.  CARDIOVASCULAR: per HPI RESPIRATORY: No shortness of breath, cough or sputum.  GASTROINTESTINAL: No anorexia, nausea, vomiting or diarrhea. No abdominal pain or blood.  GENITOURINARY: No burning on urination, no polyuria NEUROLOGICAL: No headache, dizziness, syncope, paralysis, ataxia, numbness or tingling in the extremities. No change in bowel or bladder control.  MUSCULOSKELETAL: Chronic back pain  LYMPHATICS: No enlarged nodes. No history of splenectomy.  PSYCHIATRIC: No history of depression or anxiety.  ENDOCRINOLOGIC: No reports of sweating, cold or heat intolerance. No polyuria or polydipsia.  Marland Kitchen   Physical Examination p 55 bp 104/66 Wt 151 lbs BMI 28 Gen: resting comfortably, no acute distress HEENT: no scleral icterus, pupils equal round and reactive, no palptable cervical adenopathy,  CV: RRR, no m/r/g, no JVD, no carotid bruits Resp: Clear to auscultation bilaterally GI: abdomen is soft, non-tender, non-distended, normal bowel sounds, no hepatosplenomegaly MSK: extremities are warm, no edema. Chest precordium tender to palpation Skin: warm, no rash Neuro:  no focal deficits Psych: appropriate affect   Diagnostic Studies 02/27/13 Stress MPI IMPRESSION: 1. Abnormal Lexiscan Cardiolite study. 2. Marked GI uptake obscuring the inferior wall. Significant reverse redistribution. 3. Moderate fixed apical/anteroapical defect with mild hypokinesis, suggestive of myocardial scar. 4. Low normal LV systolic function, calculated EF 51%. The aforementioned areas were mildly hypokinetic. Remaining wall motion was otherwise normal. 5. Clinical correlation is highly recommended.    02/2013 Cath Procedural Findings:  Hemodynamics:  AO 104/56 mean 79 mm Hg  LV 103/12 mm Hg    Coronary angiography:  Coronary dominance: left  Left mainstem: Normal  Left anterior descending (LAD): Normal  Left circumflex (LCx): large dominant vessel gives rise to 2 OMs, 2 PLOMs, and PDA. It is normal throughout.  Right coronary artery (RCA): Normal.  Left ventriculography: Left ventricular systolic function is normal, LVEF is estimated at 55-65%, there is no significant mitral regurgitation  Final Conclusions:  1. Normal coronary anatomy.  2. Normal LV function.   04/06/13 Clinic EKG Sinus rhythm, non-specific ST/T changes    Assessment and Plan   1. Chest pain  - patient with multiple CAD  risk factors and abnormal EKG - stress MPI technically difficult, suggestion of possible defects - cath shows patent coronaries, symptoms appear to be non-cardiac chest pain - started PPI emperically with protonix 20mg  daily, she will follow up with her PCP for further management of non-cardiac chest pain.  - recommend continuing ASA 81 mg daily for primary prevention due to her multiple CAD risk factors including a family history of early CAD - will stop metoprolol as no indication to continue therapy.   Follow up 1 year  Antoine Poche, M.D., F.A.C.C.

## 2013-04-06 NOTE — Patient Instructions (Addendum)
Your physician wants you to follow-up in: ONE YEAR You will receive a reminder letter in the mail two months in advance. If you don't receive a letter, please call our office to schedule the follow-up appointment.  Your physician has recommended you make the following change in your medication:   1) START PROTONIX 20MG  ONCE DAILY 2) STOP TAKING METOPROLOL

## 2013-04-09 ENCOUNTER — Ambulatory Visit (INDEPENDENT_AMBULATORY_CARE_PROVIDER_SITE_OTHER): Payer: Medicare Other | Admitting: Otolaryngology

## 2013-04-17 ENCOUNTER — Other Ambulatory Visit: Payer: Self-pay | Admitting: Family Medicine

## 2013-06-15 DIAGNOSIS — F3110 Bipolar disorder, current episode manic without psychotic features, unspecified: Secondary | ICD-10-CM | POA: Diagnosis not present

## 2013-06-17 ENCOUNTER — Other Ambulatory Visit: Payer: Self-pay | Admitting: Family Medicine

## 2013-06-24 ENCOUNTER — Ambulatory Visit (INDEPENDENT_AMBULATORY_CARE_PROVIDER_SITE_OTHER): Payer: Medicare Other | Admitting: Family Medicine

## 2013-06-24 ENCOUNTER — Encounter: Payer: Self-pay | Admitting: Family Medicine

## 2013-06-24 ENCOUNTER — Encounter (INDEPENDENT_AMBULATORY_CARE_PROVIDER_SITE_OTHER): Payer: Self-pay

## 2013-06-24 VITALS — BP 164/90 | HR 84 | Resp 18 | Ht 62.75 in | Wt 159.0 lb

## 2013-06-24 DIAGNOSIS — R7301 Impaired fasting glucose: Secondary | ICD-10-CM

## 2013-06-24 DIAGNOSIS — I10 Essential (primary) hypertension: Secondary | ICD-10-CM

## 2013-06-24 DIAGNOSIS — R5381 Other malaise: Secondary | ICD-10-CM

## 2013-06-24 DIAGNOSIS — M25559 Pain in unspecified hip: Secondary | ICD-10-CM | POA: Diagnosis not present

## 2013-06-24 DIAGNOSIS — F319 Bipolar disorder, unspecified: Secondary | ICD-10-CM

## 2013-06-24 DIAGNOSIS — J449 Chronic obstructive pulmonary disease, unspecified: Secondary | ICD-10-CM

## 2013-06-24 DIAGNOSIS — R5383 Other fatigue: Secondary | ICD-10-CM | POA: Diagnosis not present

## 2013-06-24 DIAGNOSIS — E785 Hyperlipidemia, unspecified: Secondary | ICD-10-CM

## 2013-06-24 DIAGNOSIS — F17200 Nicotine dependence, unspecified, uncomplicated: Secondary | ICD-10-CM

## 2013-06-24 DIAGNOSIS — R569 Unspecified convulsions: Secondary | ICD-10-CM

## 2013-06-24 NOTE — Patient Instructions (Signed)
F/u in 4.5 month, call if you need me before  You are referred to Dr Luna Glasgow re right hip pain  Please plan to quit smoking, to improve your health  Clonidine is sent in today, the others wiill be sent tomorrow after I review your labs  HBA1C, lipid, cmp , tSH today

## 2013-06-25 LAB — COMPREHENSIVE METABOLIC PANEL
ALT: 11 U/L (ref 0–35)
AST: 14 U/L (ref 0–37)
Albumin: 4.7 g/dL (ref 3.5–5.2)
Alkaline Phosphatase: 97 U/L (ref 39–117)
BUN: 8 mg/dL (ref 6–23)
CO2: 27 mEq/L (ref 19–32)
Calcium: 10.3 mg/dL (ref 8.4–10.5)
Chloride: 100 mEq/L (ref 96–112)
Creat: 0.66 mg/dL (ref 0.50–1.10)
Glucose, Bld: 92 mg/dL (ref 70–99)
Potassium: 4.5 mEq/L (ref 3.5–5.3)
Sodium: 135 mEq/L (ref 135–145)
Total Bilirubin: 0.4 mg/dL (ref 0.2–1.2)
Total Protein: 7.5 g/dL (ref 6.0–8.3)

## 2013-06-25 LAB — LIPID PANEL
Cholesterol: 170 mg/dL (ref 0–200)
HDL: 46 mg/dL (ref >39)
LDL Cholesterol: 99 mg/dL (ref 0–99)
Total CHOL/HDL Ratio: 3.7 Ratio
Triglycerides: 124 mg/dL (ref <150)
VLDL: 25 mg/dL (ref 0–40)

## 2013-06-25 LAB — HEMOGLOBIN A1C
Hgb A1c MFr Bld: 5.5 % (ref <5.7)
Mean Plasma Glucose: 111 mg/dL (ref <117)

## 2013-06-25 LAB — TSH: TSH: 1.118 u[IU]/mL (ref 0.350–4.500)

## 2013-06-25 MED ORDER — METHYLPREDNISOLONE ACETATE 80 MG/ML IJ SUSP
80.0000 mg | Freq: Once | INTRAMUSCULAR | Status: AC
  Administered 2013-06-24: 80 mg via INTRAMUSCULAR

## 2013-06-26 MED ORDER — CLONIDINE HCL 0.1 MG PO TABS: ORAL_TABLET | ORAL | Status: DC

## 2013-07-01 ENCOUNTER — Other Ambulatory Visit (HOSPITAL_COMMUNITY): Payer: Self-pay | Admitting: Orthopaedic Surgery

## 2013-07-01 DIAGNOSIS — I10 Essential (primary) hypertension: Secondary | ICD-10-CM | POA: Diagnosis not present

## 2013-07-01 DIAGNOSIS — M25559 Pain in unspecified hip: Secondary | ICD-10-CM | POA: Diagnosis not present

## 2013-07-01 DIAGNOSIS — F17200 Nicotine dependence, unspecified, uncomplicated: Secondary | ICD-10-CM | POA: Diagnosis not present

## 2013-07-01 DIAGNOSIS — M25551 Pain in right hip: Secondary | ICD-10-CM

## 2013-07-02 ENCOUNTER — Encounter (HOSPITAL_COMMUNITY): Payer: Self-pay

## 2013-07-02 ENCOUNTER — Ambulatory Visit (HOSPITAL_COMMUNITY)
Admission: RE | Admit: 2013-07-02 | Discharge: 2013-07-02 | Disposition: A | Discharge status: Home / Self Care | Payer: Medicare Other | Source: Ambulatory Visit | Attending: Orthopaedic Surgery | Admitting: Orthopaedic Surgery

## 2013-07-02 DIAGNOSIS — R29898 Other symptoms and signs involving the musculoskeletal system: Secondary | ICD-10-CM | POA: Diagnosis not present

## 2013-07-02 DIAGNOSIS — M25559 Pain in unspecified hip: Secondary | ICD-10-CM | POA: Insufficient documentation

## 2013-07-02 DIAGNOSIS — M25551 Pain in right hip: Secondary | ICD-10-CM

## 2013-07-06 ENCOUNTER — Telehealth: Payer: Self-pay

## 2013-07-06 DIAGNOSIS — M25559 Pain in unspecified hip: Secondary | ICD-10-CM | POA: Diagnosis not present

## 2013-07-06 DIAGNOSIS — F17200 Nicotine dependence, unspecified, uncomplicated: Secondary | ICD-10-CM | POA: Diagnosis not present

## 2013-07-06 DIAGNOSIS — I10 Essential (primary) hypertension: Secondary | ICD-10-CM | POA: Diagnosis not present

## 2013-07-06 NOTE — Telephone Encounter (Signed)
Pls request note from Dr Luna Glasgow , I need messages from the [place] with requests like this, so I can clearly understand the plan. Pls explain this to her that I am awaiting word from Dr Luna Glasgow As far as I recall I somehow thought that she sees Dr Merlene Laughter for some pain management issues also pls clarify for me, thanks

## 2013-07-06 NOTE — Telephone Encounter (Signed)
Patient aware and states that mri ordered by dr. Luna Glasgow showed problems with back and not hips.  He is referring her elsewhere but was told to contact this office for pain meds in the interim.  She is aware that we will need to hear back from his office.

## 2013-07-06 NOTE — Telephone Encounter (Signed)
Please advise 

## 2013-07-07 ENCOUNTER — Telehealth: Payer: Self-pay | Admitting: Family Medicine

## 2013-07-07 MED ORDER — FENTANYL 25 MCG/HR TD PT72: 25.0000 ug | MEDICATED_PATCH | TRANSDERMAL | Status: DC

## 2013-07-07 NOTE — Telephone Encounter (Signed)
Patient aware.

## 2013-07-07 NOTE — Telephone Encounter (Signed)
Pls let pt know info has been obtained from Dr Luna Glasgow, I will erx fentanyl patch to last the  till she sees Doc in [place], (Unable to rx tramadol as cross allergic reaction with the multiple other drugs listed. No need for pain contract a this is one time only, unless I hear otherwise from [place] writing, please explain this to her

## 2013-07-07 NOTE — Telephone Encounter (Signed)
noted 

## 2013-07-07 NOTE — Telephone Encounter (Signed)
Patient coming to collect a new rx for pain

## 2013-07-14 ENCOUNTER — Telehealth: Payer: Self-pay | Admitting: Family Medicine

## 2013-07-14 NOTE — Telephone Encounter (Signed)
Called patient and left message for them to return call at the office   

## 2013-07-15 NOTE — Telephone Encounter (Signed)
Patient states that she was only dispensed 5 patches when she went to collect at pharmacy due to their protocol.  She has however been using incorrectly and changing every 2 days.  She states that she has some irritation from the patch as well and does not feel that they are not relieving pain.  She still does not have an appt at [facility].  Please advise.

## 2013-07-15 NOTE — Telephone Encounter (Signed)
Initial impression was that she does have appt at [facility]  Needs to see local pain specialist , I have no more alternatives to offer and have noted allergic reaction to the fentanyl patch so I will not refill this She just had Toradol and depomedrol IM so not an option, but if did not have oral pred, pls rx if she can take this pred 5mg  dose pack x 6days There is no record on file of MRI of her low back, she always says it is at [facility], since she has no appt there, and to see a local pain specialist,needs mRI lumbar spine without contrast eval back pain with radiation, She can be referred to either Dr Merlene Laughter or Francesco Runner for pain management whichever she chooses Pls order MRI  Lumbar spine no contrast, ensure and document that the history is low back pain radiating to lower extremity  Questions/concerns, pls discuss

## 2013-07-16 MED ORDER — FENTANYL 25 MCG/HR TD PT72: 25.0000 ug | MEDICATED_PATCH | TRANSDERMAL | Status: DC

## 2013-07-16 NOTE — Telephone Encounter (Signed)
I will erx an additional 2 week supply of the patches , She needs to have had an mRI of her spine available in the last 5 years  That explains the need  For pain meds please

## 2013-07-16 NOTE — Telephone Encounter (Signed)
Verified with pharmacy that patient only was dispensed 5 due to pharmacy being in short supply.  Rx was filled on 3/17 and is due 3/29.  Please advise is she can receive remaining 5.

## 2013-07-16 NOTE — Telephone Encounter (Signed)
Please leave a msg , I need to speak with Dr Luna Glasgow re this patient so I can determine what needs tyo be dione. Also pls ask pharmacy what pain medication Dr Luna Glasgow was precribing and at what dose, or if she has been getting pain meds form anywhere

## 2013-07-16 NOTE — Telephone Encounter (Signed)
pls see recent message

## 2013-07-16 NOTE — Telephone Encounter (Signed)
Spoke with patient.  Her stories are contradicting.  She is now stating that she only received 5 of 10 patches ordered on 3/17 due to pharmacy not having enough on hand.  She signed off at pharmacy to discontinue remaining 5.  She now wants a new rx for remaining 5 which is needed in order to receive.  She still has no scheduled appt at [facility] and states that Dr. Brooke Bonito office is working on this.  Please advise if patient can get rx for fentanyl 71mcg #5.  Will verify with pharmacy above.

## 2013-07-17 NOTE — Telephone Encounter (Signed)
Called and left message notifying patient of rx and need to contact office.

## 2013-07-17 NOTE — Telephone Encounter (Signed)
Patient aware and in to collect rx.  She states that mri done by Dr. Luna Glasgow shows abnormalities in her back.

## 2013-07-27 ENCOUNTER — Telehealth: Payer: Self-pay | Admitting: Family Medicine

## 2013-07-27 DIAGNOSIS — M549 Dorsalgia, unspecified: Secondary | ICD-10-CM

## 2013-07-29 NOTE — Telephone Encounter (Signed)
neeeds appt with pain manegment opls refer to [place] or [place] her choice , I will sign

## 2013-07-30 ENCOUNTER — Other Ambulatory Visit: Payer: Self-pay

## 2013-07-30 MED ORDER — FENTANYL 25 MCG/HR TD PT72: 25.0000 ug | MEDICATED_PATCH | TRANSDERMAL | Status: DC

## 2013-07-30 NOTE — Telephone Encounter (Signed)
Patient aware.

## 2013-07-30 NOTE — Telephone Encounter (Signed)
Pls explain to patient that she will need to have an mRI of her spine in [place] so I can see what is being treated and see  What the problem is.  Order is entered for MRI to be done 4/20 or that week. I have printed 5 patches only

## 2013-07-30 NOTE — Addendum Note (Signed)
Addended by: Tula Nakayama E on: 07/30/2013 12:43 PM   Modules accepted: Orders

## 2013-07-30 NOTE — Telephone Encounter (Signed)
Has already seen [place] and there is nothing else he can do. She has been sent to [place] but can't get in until June 3rd.  Severe pain in her lower back running down her left leg into her foot. Feels like an icepick stabbing her. Hard for her to walk. Has 2 pain patches left and wants to make sure they are going to be refilled before you leave.

## 2013-07-30 NOTE — Telephone Encounter (Signed)
Noted, thanks!

## 2013-07-30 NOTE — Telephone Encounter (Signed)
Alternate number is [phone] room [phone]

## 2013-08-07 ENCOUNTER — Encounter (HOSPITAL_COMMUNITY): Payer: Self-pay

## 2013-08-07 ENCOUNTER — Ambulatory Visit (HOSPITAL_COMMUNITY)
Admission: RE | Admit: 2013-08-07 | Discharge: 2013-08-07 | Disposition: A | Discharge status: Home / Self Care | Payer: Medicare Other | Source: Ambulatory Visit | Attending: Family Medicine | Admitting: Family Medicine

## 2013-08-07 DIAGNOSIS — M5137 Other intervertebral disc degeneration, lumbosacral region: Secondary | ICD-10-CM | POA: Insufficient documentation

## 2013-08-07 DIAGNOSIS — R209 Unspecified disturbances of skin sensation: Secondary | ICD-10-CM | POA: Diagnosis not present

## 2013-08-07 DIAGNOSIS — M5126 Other intervertebral disc displacement, lumbar region: Secondary | ICD-10-CM | POA: Insufficient documentation

## 2013-08-07 DIAGNOSIS — M545 Low back pain, unspecified: Secondary | ICD-10-CM | POA: Diagnosis not present

## 2013-08-07 DIAGNOSIS — Q762 Congenital spondylolisthesis: Secondary | ICD-10-CM | POA: Diagnosis not present

## 2013-08-07 DIAGNOSIS — M51379 Other intervertebral disc degeneration, lumbosacral region without mention of lumbar back pain or lower extremity pain: Secondary | ICD-10-CM | POA: Insufficient documentation

## 2013-08-07 DIAGNOSIS — M47817 Spondylosis without myelopathy or radiculopathy, lumbosacral region: Secondary | ICD-10-CM | POA: Diagnosis not present

## 2013-08-07 DIAGNOSIS — M549 Dorsalgia, unspecified: Secondary | ICD-10-CM

## 2013-08-09 ENCOUNTER — Other Ambulatory Visit: Payer: Self-pay | Admitting: Family Medicine

## 2013-08-09 DIAGNOSIS — M5090 Cervical disc disorder, unspecified, unspecified cervical region: Secondary | ICD-10-CM

## 2013-08-19 ENCOUNTER — Telehealth: Payer: Self-pay | Admitting: Family Medicine

## 2013-08-19 DIAGNOSIS — Q762 Congenital spondylolisthesis: Secondary | ICD-10-CM | POA: Diagnosis not present

## 2013-08-19 NOTE — Telephone Encounter (Signed)
pls send for Dr Sande Rives note, if it confirms this then I will continue to prescribe pain patches pls verify wit her appt date at [facility]

## 2013-08-20 NOTE — Telephone Encounter (Signed)
Sent for note  

## 2013-08-23 NOTE — Assessment & Plan Note (Signed)
No recent seizure activity, followed by neurology

## 2013-08-23 NOTE — Assessment & Plan Note (Signed)
Increased and uncontrolled referred to [facility] for eval and management, I suspect her pain os from the spine

## 2013-08-23 NOTE — Assessment & Plan Note (Addendum)
Uncontrolled, pt currently out of clonidine and in pain, med sent in [place] and commitment to daily physical activity for a minimum of 30 minutes discussed and encouraged, as a part of hypertension management. The importance of attaining a healthy weight is also discussed.

## 2013-08-23 NOTE — Progress Notes (Signed)
   Subjective:    Patient ID: Brooke Maddox, female    DOB: 1970-12-01, 43 y.o.   MRN: [id number]  HPI The PT is here for follow up and re-evaluation of chronic medical conditions, medication management and review of any available recent lab and radiology data.  Preventive health is updated, specifically  Cancer screening and Immunization.   Questions or concerns regarding consultations or procedures which the PT has had in the interim are  addressed. The PT denies any adverse reactions to current medications since the last visit.  C/o increased and uncontrolled right hip pin in the past several weeks, denies any specific trauma as aggravating factor, has upcoming appt at [facility] re spine , but believes that Brooke Maddox is unrelated      Review of Systems See HPI Denies recent fever or chills. Denies sinus pressure, nasal congestion, ear pain or sore throat. Denies chest congestion, productive cough or wheezing. Denies chest pains, palpitations and leg swelling Denies abdominal pain, nausea, vomiting,diarrhea or constipation.   Denies dysuria, frequency, hesitancy or incontinence.  Denies headaches, seizures, numbness, or tingling. Denies uncontrolled  depression, anxiety or insomnia. Denies skin break down or rash.        Objective:   Physical Exam BP 164/90  Pulse 84  Resp 18  Ht 5' 2.75" (1.594 m)  Wt 159 lb (72.122 kg)  BMI 28.39 kg/m2  SpO2 97% Patient alert and oriented and in no cardiopulmonary distress.pt in pin  HEENT: No facial asymmetry, EOMI, no sinus tenderness,  oropharynx pink and moist.  Neck supple no adenopathy.  Chest: Clear to auscultation bilaterally.Decreased air entry   CVS: S1, S2 no murmurs, no S3.  ABD: Soft non tender. Bowel sounds normal.  Ext: No edema  MS: decreased ROM lumbar spine, and right hip adequate in shoulders  and knees.  Skin: Intact, no ulcerations or rash noted.  Psych: Good eye contact, normal affect. Memory intact not anxious or  depressed appearing.  CNS: CN 2-12 intact, power, tone and sensation normal throughout.`        Assessment & Plan:  ESSENTIAL HYPERTENSION Uncontrolled, pt currently out of clonidine and in pain, med sent in [place] and commitment to daily physical activity for a minimum of 30 minutes discussed and encouraged, as a part of hypertension management. The importance of attaining a healthy weight is also discussed.   COPD Worsening due to ongoing nicotine use inhalers daily for improived breathing and counseled to quit smoking  Impaired fasting glucose Improved, pt applauded on this  HIP PAIN, RIGHT Increased and uncontrolled referred to orhto for eval and management, I suspect her pain os from the spine  HYPERLIPIDEMIA Controlled, no change in medication Hyperlipidemia:Low fat diet discussed and encouraged.    SEIZURE DISORDER No recent seizure activity, followed by neurology  BIPOLAR AFFECTIVE DISORDER Stable and treated by psychiatry, also sees a therapist regualrly  NICOTINE ADDICTION unchanged and unwilling to set quit date currently Patient counseled for approximately 5 minutes regarding the health risks of ongoing nicotine use, specifically all types of cancer, heart disease, stroke and respiratory failure. The options available for help with cessation ,the behavioral changes to assist the process, and the option to either gradully reduce usage  Or abruptly stop.is also discussed. Pt is also encouraged to set specific goals in number of cigarettes used daily, as well as to set a quit date.

## 2013-08-23 NOTE — Assessment & Plan Note (Signed)
Improved, pt applauded on this

## 2013-08-23 NOTE — Assessment & Plan Note (Signed)
Worsening due to ongoing nicotine use inhalers daily for improived breathing and counseled to quit smoking

## 2013-08-23 NOTE — Assessment & Plan Note (Signed)
unchanged and unwilling to set quit date currently Patient counseled for approximately 5 minutes regarding the health risks of ongoing nicotine use, specifically all types of cancer, heart disease, stroke and respiratory failure. The options available for help with cessation ,the behavioral changes to assist the process, and the option to either gradully reduce usage  Or abruptly stop.is also discussed. Pt is also encouraged to set specific goals in number of cigarettes used daily, as well as to set a quit date.

## 2013-08-23 NOTE — Assessment & Plan Note (Signed)
Stable and treated by psychiatry, also sees a therapist regualrly

## 2013-08-23 NOTE — Assessment & Plan Note (Signed)
Controlled, no change in medication Hyperlipidemia:Low fat diet discussed and encouraged.  \ 

## 2013-08-27 ENCOUNTER — Other Ambulatory Visit: Payer: Self-pay

## 2013-08-27 ENCOUNTER — Telehealth: Payer: Self-pay | Admitting: Family Medicine

## 2013-08-27 MED ORDER — FENTANYL 25 MCG/HR TD PT72: 25.0000 ug | MEDICATED_PATCH | TRANSDERMAL | Status: DC

## 2013-08-27 NOTE — Telephone Encounter (Signed)
Stated in his note that he is deferring management of her back to [facility], and she is being prescribed fentanyl from this office till that time.  If she wants to be referred to Dr Merlene Laughter who she already sees , specifically for medical management of her back pain I am more than happy to refer her , pls enter  If she wants this ,I will sign

## 2013-08-27 NOTE — Telephone Encounter (Signed)
Patient aware to call back to Dr and see if they will reschedule her appt for [facility]. Coming to collect her rx for fentanyl

## 2013-08-28 ENCOUNTER — Telehealth: Payer: Self-pay | Admitting: Family Medicine

## 2013-09-01 DIAGNOSIS — F319 Bipolar disorder, unspecified: Secondary | ICD-10-CM | POA: Diagnosis not present

## 2013-09-10 ENCOUNTER — Telehealth: Payer: Self-pay | Admitting: Family Medicine

## 2013-09-10 MED ORDER — FENTANYL 25 MCG/HR TD PT72: 25.0000 ug | MEDICATED_PATCH | TRANSDERMAL | Status: DC

## 2013-09-10 NOTE — Telephone Encounter (Signed)
States [facility] said there was nothing else they could do. She's not currently seeing anyone for her back. Wants to be referred to [facility] at [facility]. Please advise

## 2013-09-21 ENCOUNTER — Other Ambulatory Visit: Payer: Self-pay | Admitting: Family Medicine

## 2013-09-30 ENCOUNTER — Telehealth: Payer: Self-pay

## 2013-09-30 NOTE — Telephone Encounter (Signed)
pls schedule an appt in the office, this pt has had multiple differnt referrals

## 2013-09-30 NOTE — Telephone Encounter (Addendum)
Patient states the last neurosurgeon you sent her to on 4/29 wouldn't touch her back. Advised she go back to [facility]. She is having a very hard time with her back and wants a referral to someone that will do something for her. Please advise

## 2013-10-01 ENCOUNTER — Other Ambulatory Visit: Payer: Self-pay

## 2013-10-01 MED ORDER — FENTANYL 25 MCG/HR TD PT72: 25.0000 ug | MEDICATED_PATCH | TRANSDERMAL | Status: DC

## 2013-10-01 NOTE — Telephone Encounter (Signed)
Pt has appt for Monday.

## 2013-10-02 NOTE — Telephone Encounter (Signed)
Patient will now get phentanol patches from [facility] if your regular pharmacy does not have supply.

## 2013-10-05 ENCOUNTER — Encounter: Payer: Self-pay | Admitting: Family Medicine

## 2013-10-05 ENCOUNTER — Ambulatory Visit (INDEPENDENT_AMBULATORY_CARE_PROVIDER_SITE_OTHER): Payer: Medicare Other | Admitting: Family Medicine

## 2013-10-05 ENCOUNTER — Encounter (INDEPENDENT_AMBULATORY_CARE_PROVIDER_SITE_OTHER): Payer: Self-pay

## 2013-10-05 VITALS — BP 134/84 | HR 76 | Resp 18 | Ht 62.75 in | Wt 166.0 lb

## 2013-10-05 DIAGNOSIS — F17200 Nicotine dependence, unspecified, uncomplicated: Secondary | ICD-10-CM | POA: Diagnosis not present

## 2013-10-05 DIAGNOSIS — M549 Dorsalgia, unspecified: Secondary | ICD-10-CM | POA: Diagnosis not present

## 2013-10-05 DIAGNOSIS — E785 Hyperlipidemia, unspecified: Secondary | ICD-10-CM

## 2013-10-05 DIAGNOSIS — R569 Unspecified convulsions: Secondary | ICD-10-CM

## 2013-10-05 DIAGNOSIS — R946 Abnormal results of thyroid function studies: Secondary | ICD-10-CM

## 2013-10-05 DIAGNOSIS — J449 Chronic obstructive pulmonary disease, unspecified: Secondary | ICD-10-CM | POA: Diagnosis not present

## 2013-10-05 DIAGNOSIS — I10 Essential (primary) hypertension: Secondary | ICD-10-CM

## 2013-10-05 DIAGNOSIS — M25559 Pain in unspecified hip: Secondary | ICD-10-CM

## 2013-10-05 DIAGNOSIS — R7301 Impaired fasting glucose: Secondary | ICD-10-CM | POA: Diagnosis not present

## 2013-10-05 DIAGNOSIS — F319 Bipolar disorder, unspecified: Secondary | ICD-10-CM

## 2013-10-05 MED ORDER — GABAPENTIN 100 MG PO CAPS: 100.0000 mg | ORAL_CAPSULE | Freq: Three times a day (TID) | ORAL | Status: DC

## 2013-10-05 MED ORDER — METHYLPREDNISOLONE ACETATE 80 MG/ML IJ SUSP
80.0000 mg | Freq: Once | INTRAMUSCULAR | Status: AC
  Administered 2013-10-05: 80 mg via INTRAMUSCULAR

## 2013-10-05 MED ORDER — KETOROLAC TROMETHAMINE 60 MG/2ML IM SOLN
60.0000 mg | Freq: Once | INTRAMUSCULAR | Status: AC
  Administered 2013-10-05: 60 mg via INTRAMUSCULAR

## 2013-10-05 NOTE — Assessment & Plan Note (Signed)
Updated lab needed at/ before next visit. Patient educated about the importance of limiting  Carbohydrate intake , the need to commit to daily physical activity for a minimum of 30 minutes , and to commit weight loss. The fact that changes in all these areas will reduce or eliminate all together the development of diabetes is stressed.   Pt has started gaining weight blames it on cutting back on smoking , counseled against over eating since she is prediabetic

## 2013-10-05 NOTE — Assessment & Plan Note (Signed)
5 day h/o increased and uncontrolled pain. Injections in office, pt educated re correct use of fentanyl, states she "skips doses " since irt does not help the "nerve pain" which she is currently having She is to start gabapentin for nerve pain  She is referred to [facility] neurosurgery for eval and treatment of her back pain, sh has seen a neurosurgeon in [place] who recommended that she return where she was seen years ago due to back pain, she is happy for the referral

## 2013-10-05 NOTE — Assessment & Plan Note (Signed)
Controlled, no change in medication Updated lab needed at/ before next visit.  

## 2013-10-05 NOTE — Assessment & Plan Note (Signed)
Worsening due to continued smoking, cessation counseling done Pt to continue daily inhalers

## 2013-10-05 NOTE — Assessment & Plan Note (Signed)
Patient counseled for approximately 5 minutes regarding the health risks of ongoing nicotine use, specifically all types of cancer, heart disease, stroke and respiratory failure. The options available for help with cessation ,the behavioral changes to assist the process, and the option to either gradully reduce usage  Or abruptly stop.is also discussed. Pt is also encouraged to set specific goals in number of cigarettes used daily, as well as to set a quit date. Will try to reduce by 1 cigarette per month

## 2013-10-05 NOTE — Assessment & Plan Note (Signed)
Controlled, no change in medication DASH diet and commitment to daily physical activity for a minimum of 30 minutes discussed and encouraged, as a part of hypertension management. The importance of attaining a healthy weight is also discussed.  

## 2013-10-05 NOTE — Patient Instructions (Signed)
Annual wellness exam, initial , early October, call if you need me before  Fasting lipid, cmp and hBa1C in October before visit  Injections today for pain, new medication gabapentin 100mg  , start 1 with 1 capsule atr bedtime, increase every 3 dasys up to 3 at bedtime if you need to for better control of the nerve pain  Please cut back cigarettes as we discussed to improve your health and reduce  Your risk of serious illnesses  Smoking Cessation, Tips for Success If you are ready to quit smoking, congratulations! You have chosen to help yourself be healthier. Cigarettes bring nicotine, tar, carbon monoxide, and other irritants into your body. Your lungs, heart, and blood vessels will be able to work better without these poisons. There are many different ways to quit smoking. Nicotine gum, nicotine patches, a nicotine inhaler, or nicotine nasal spray can help with physical craving. Hypnosis, support groups, and medicines help break the habit of smoking. WHAT THINGS CAN I DO TO MAKE QUITTING EASIER?  Here are some tips to help you quit for good:  Pick a date when you will quit smoking completely. Tell all of your friends and family about your plan to quit on that date.  Do not try to slowly cut down on the number of cigarettes you are smoking. Pick a quit date and quit smoking completely starting on that day.  Throw away all cigarettes.   Clean and remove all ashtrays from your home, work, and car.   On a card, write down your reasons for quitting. Carry the card with you and read it when you get the urge to smoke.   Cleanse your body of nicotine. Drink enough water and fluids to keep your urine clear or pale yellow. Do this after quitting to flush the nicotine from your body.   Learn to predict your moods. Do not let a bad situation be your excuse to have a cigarette. Some situations in your life might tempt you into wanting a cigarette.   Never have "just one" cigarette. It leads to  wanting another and another. Remind yourself of your decision to quit.   Change habits associated with smoking. If you smoked while driving or when feeling stressed, try other activities to replace smoking. Stand up when drinking your coffee. Brush your teeth after eating. Sit in a different chair when you read the paper. Avoid alcohol while trying to quit, and try to drink fewer caffeinated beverages. Alcohol and caffeine may urge you to smoke.   Avoid foods and drinks that can trigger a desire to smoke, such as sugary or spicy foods and alcohol.   Ask people who smoke not to smoke around you.   Have something planned to do right after eating or having a cup of coffee. For example, plan to take a walk or exercise.   Try a relaxation exercise to calm you down and decrease your stress. Remember, you may be tense and nervous for the first 2 weeks after you quit, but this will pass.   Find new activities to keep your hands busy. Play with a pen, coin, or rubber band. Doodle or draw things on paper.   Brush your teeth right after eating. This will help cut down on the craving for the taste of tobacco after meals. You can also try mouthwash.   Use oral substitutes in place of cigarettes. Try using lemon drops, carrots, cinnamon sticks, or chewing gum. Keep them handy so they are available when you  have the urge to smoke.   When you have the urge to smoke, try deep breathing.   Designate your home as a nonsmoking area.   If you are a heavy smoker, ask your health care provider about a prescription for nicotine chewing gum. It can ease your withdrawal from nicotine.   Reward yourself. Set aside the cigarette money you save and buy yourself something nice.   Look for support from others. Join a support group or smoking cessation program. Ask someone at home or at work to help you with your plan to quit smoking.   Always ask yourself, "Do I need this cigarette or is this just a  reflex?" Tell yourself, "Today, I choose not to smoke," or "I do not want to smoke." You are reminding yourself of your decision to quit.  Do not replace cigarette smoking with electronic cigarettes (commonly called e-cigarettes). The safety of e-cigarettes is unknown, and some may contain harmful chemicals.  If you relapse, do not give up! Plan ahead and think about what you will do the next time you get the urge to smoke.  HOW WILL I FEEL WHEN I QUIT SMOKING? You may have symptoms of withdrawal because your body is used to nicotine (the addictive substance in cigarettes). You may crave cigarettes, be irritable, feel very hungry, cough often, get headaches, or have difficulty concentrating. The withdrawal symptoms are only temporary. They are strongest when you first quit but will go away within 10 14 days. When withdrawal symptoms occur, stay in control. Think about your reasons for quitting. Remind yourself that these are signs that your body is healing and getting used to being without cigarettes. Remember that withdrawal symptoms are easier to treat than the major diseases that smoking can cause.  Even after the withdrawal is over, expect periodic urges to smoke. However, these cravings are generally short lived and will go away whether you smoke or not. Do not smoke!  WHAT RESOURCES ARE AVAILABLE TO HELP ME QUIT SMOKING? Your health care provider can direct you to community resources or hospitals for support, which may include:  Group support.  Education.  Hypnosis.  Therapy. Document Released: 01/06/2004 Document Revised: 01/28/2013 Document Reviewed: 09/25/2012 [facility] Patient Information 2014 [place], [place].    Fall Prevention and Home Safety Falls cause injuries and can affect all age groups. It is possible to prevent falls.  HOW TO PREVENT FALLS  Wear shoes with rubber soles that do not have an opening for your toes.  Keep the inside and outside of your house well  lit.  Use night lights throughout your home.  Remove clutter from floors.  Clean up floor spills.  Remove throw rugs or fasten them to the floor with carpet tape.  Do not place electrical cords across pathways.  Put grab bars by your tub, shower, and toilet. Do not use towel bars as grab bars.  Put handrails on both sides of the stairway. Fix loose handrails.  Do not climb on stools or stepladders, if possible.  Do not wax your floors.  Repair uneven or unsafe sidewalks, walkways, or stairs.  Keep items you use a lot within reach.  Be aware of pets.  Keep emergency numbers next to the telephone.  Put smoke detectors in your home and near bedrooms. Ask your doctor what other things you can do to prevent falls. Document Released: 02/03/2009 Document Revised: 10/09/2011 Document Reviewed: 07/10/2011 [facility] Patient Information 2014 [place], [place].

## 2013-10-05 NOTE — Assessment & Plan Note (Signed)
Pt reports that the local ortho Doc who was treating her for this has determined that her chronic pain is due to back pathology , more so than her hips

## 2013-10-05 NOTE — Assessment & Plan Note (Signed)
Stable and treated by psychiatry 

## 2013-10-05 NOTE — Progress Notes (Signed)
Subjective:    Patient ID: Brooke Maddox, female    DOB: September 26, 1970, 43 y.o.   MRN: [id number]  HPI  The PT is here for follow up and re-evaluation of chronic medical conditions, medication management and review of any available recent lab and radiology data. Her main concern is that of uncontrolled back pain radiaiting to her right leg. She has had a flare of symptoms in the past week, no aggravating factor noted and she has been awaiting an appt at [facility] service, referral is entered and will follow through on this. She reports marked limitation in her mobility and gait instability with falls Preventive health is updated, specifically  Cancer screening and Immunization.   Questions or concerns regarding consultations or procedures which the PT has had in the interim are  addressed. The PT denies any adverse reactions to current medications since the last visit.       Review of Systems See HPI Denies recent fever or chills. Denies sinus pressure, nasal congestion, ear pain or sore throat. Denies chest congestion, productive cough or wheezing.Chronic shortness of breath due to ongoing nicotine use Denies chest pains, palpitations and leg swelling Denies abdominal pain, nausea, vomiting,diarrhea or constipation.   Denies dysuria, frequency, hesitancy or incontinence.  Denies uncontrolled  Headaches or  seizures,  Denies uncontrolled  depression, anxiety or insomnia.Increased stres with recent relocation Denies skin break down or rash.        Objective:   Physical Exam BP 134/84  Pulse 76  Resp 18  Ht 5' 2.75" (1.594 m)  Wt 166 lb 0.6 oz (75.315 kg)  BMI 29.64 kg/m2  SpO2 96% Pt in pain Patient alert and oriented and in no cardiopulmonary distress.  HEENT: No facial asymmetry, EOMI,   oropharynx pink and moist.  Neck supple no JVD, no mass.  Chest: Clear to auscultation bilaterally.  CVS: S1, S2 no murmurs, no S3.  ABD: Soft non tender.   Ext: No  edema  MS: decreased  ROM lumbar spine, and right , hip, adequate in shoulders and  and knees.Abnormal gait due nto uncontrolled pain at this visit  Skin: Intact, no ulcerations or rash noted.  Psych: Good eye contact, normal affect. Memory intact not anxious or depressed appearing.  CNS: CN 2-12 intact, power,  normal throughout.no focal deficits noted.        Assessment & Plan:  Back pain with radiation 5 day h/o increased and uncontrolled pain. Injections in office, pt educated re correct use of fentanyl, states she "skips doses " since irt does not help the "nerve pain" which she is currently having She is to start gabapentin for nerve pain  She is referred to [facility] neurosurgery for eval and treatment of her back pain, sh has seen a neurosurgeon in [place] who recommended that she return where she was seen years ago due to back pain, she is happy for the referral   ABNORMAL THYROID FUNCTION TESTS Controlled, no change in medication Updated lab needed at/ before next visit.   Impaired fasting glucose Updated lab needed at/ before next visit. Patient educated about the importance of limiting  Carbohydrate intake , the need to commit to daily physical activity for a minimum of 30 minutes , and to commit weight loss. The fact that changes in all these areas will reduce or eliminate all together the development of diabetes is stressed.   Pt has started gaining weight blames it on cutting back on smoking , counseled  against over eating since she is prediabetic  BIPOLAR AFFECTIVE DISORDER Stable and treated by psychiatry  SEIZURE DISORDER Controlled on kepra, she is treated for this by neurology  NICOTINE ADDICTION Patient counseled for approximately 5 minutes regarding the health risks of ongoing nicotine use, specifically all types of cancer, heart disease, stroke and respiratory failure. The options available for help with cessation ,the behavioral changes to assist the process,  and the option to either gradully reduce usage  Or abruptly stop.is also discussed. Pt is also encouraged to set specific goals in number of cigarettes used daily, as well as to set a quit date. Will try to reduce by 1 cigarette per month  ESSENTIAL HYPERTENSION Controlled, no change in medication DASH diet and commitment to daily physical activity for a minimum of 30 minutes discussed and encouraged, as a part of hypertension management. The importance of attaining a healthy weight is also discussed.   COPD Worsening due to continued smoking, cessation counseling done Pt to continue daily inhalers  HIP PAIN, RIGHT Pt reports that the local ortho Doc who was treating her for this has determined that her chronic pain is due to back pathology , more so than her hips

## 2013-10-05 NOTE — Assessment & Plan Note (Signed)
Controlled on kepra, she is treated for this by neurology

## 2013-10-07 DIAGNOSIS — S8000XA Contusion of unspecified knee, initial encounter: Secondary | ICD-10-CM | POA: Diagnosis not present

## 2013-10-07 DIAGNOSIS — S93409A Sprain of unspecified ligament of unspecified ankle, initial encounter: Secondary | ICD-10-CM | POA: Diagnosis not present

## 2013-10-07 DIAGNOSIS — IMO0002 Reserved for concepts with insufficient information to code with codable children: Secondary | ICD-10-CM | POA: Diagnosis not present

## 2013-10-12 NOTE — Addendum Note (Signed)
Addended by: Denman George B on: 10/12/2013 03:13 PM   Modules accepted: Orders

## 2013-10-22 ENCOUNTER — Other Ambulatory Visit: Payer: Self-pay | Admitting: Family Medicine

## 2013-10-30 ENCOUNTER — Other Ambulatory Visit: Payer: Self-pay

## 2013-10-30 MED ORDER — FENTANYL 25 MCG/HR TD PT72: 25.0000 ug | MEDICATED_PATCH | TRANSDERMAL | Status: DC

## 2013-11-04 ENCOUNTER — Other Ambulatory Visit: Payer: Self-pay

## 2013-11-04 MED ORDER — GABAPENTIN 100 MG PO CAPS: 100.0000 mg | ORAL_CAPSULE | Freq: Three times a day (TID) | ORAL | Status: DC

## 2013-11-19 ENCOUNTER — Telehealth: Payer: Self-pay | Admitting: Family Medicine

## 2013-11-20 ENCOUNTER — Emergency Department (HOSPITAL_COMMUNITY)
Admission: EM | Admit: 2013-11-20 | Discharge: 2013-11-21 | Disposition: A | Discharge status: Home / Self Care | Payer: Medicare Other | Attending: Emergency Medicine | Admitting: Emergency Medicine

## 2013-11-20 ENCOUNTER — Encounter (HOSPITAL_COMMUNITY): Payer: Self-pay | Admitting: Emergency Medicine

## 2013-11-20 DIAGNOSIS — I10 Essential (primary) hypertension: Secondary | ICD-10-CM | POA: Diagnosis not present

## 2013-11-20 DIAGNOSIS — R6883 Chills (without fever): Secondary | ICD-10-CM | POA: Diagnosis not present

## 2013-11-20 DIAGNOSIS — J4489 Other specified chronic obstructive pulmonary disease: Secondary | ICD-10-CM | POA: Insufficient documentation

## 2013-11-20 DIAGNOSIS — F17200 Nicotine dependence, unspecified, uncomplicated: Secondary | ICD-10-CM | POA: Diagnosis not present

## 2013-11-20 DIAGNOSIS — Z79899 Other long term (current) drug therapy: Secondary | ICD-10-CM | POA: Insufficient documentation

## 2013-11-20 DIAGNOSIS — E669 Obesity, unspecified: Secondary | ICD-10-CM | POA: Diagnosis not present

## 2013-11-20 DIAGNOSIS — R34 Anuria and oliguria: Secondary | ICD-10-CM | POA: Insufficient documentation

## 2013-11-20 DIAGNOSIS — M1990 Unspecified osteoarthritis, unspecified site: Secondary | ICD-10-CM | POA: Diagnosis not present

## 2013-11-20 DIAGNOSIS — R1110 Vomiting, unspecified: Secondary | ICD-10-CM | POA: Insufficient documentation

## 2013-11-20 DIAGNOSIS — Z7982 Long term (current) use of aspirin: Secondary | ICD-10-CM | POA: Insufficient documentation

## 2013-11-20 DIAGNOSIS — F411 Generalized anxiety disorder: Secondary | ICD-10-CM | POA: Insufficient documentation

## 2013-11-20 DIAGNOSIS — G40909 Epilepsy, unspecified, not intractable, without status epilepticus: Secondary | ICD-10-CM | POA: Insufficient documentation

## 2013-11-20 DIAGNOSIS — R197 Diarrhea, unspecified: Secondary | ICD-10-CM | POA: Insufficient documentation

## 2013-11-20 DIAGNOSIS — G43909 Migraine, unspecified, not intractable, without status migrainosus: Secondary | ICD-10-CM | POA: Diagnosis not present

## 2013-11-20 DIAGNOSIS — G8929 Other chronic pain: Secondary | ICD-10-CM | POA: Diagnosis not present

## 2013-11-20 DIAGNOSIS — J449 Chronic obstructive pulmonary disease, unspecified: Secondary | ICD-10-CM | POA: Insufficient documentation

## 2013-11-20 DIAGNOSIS — R109 Unspecified abdominal pain: Secondary | ICD-10-CM | POA: Insufficient documentation

## 2013-11-20 DIAGNOSIS — R51 Headache: Secondary | ICD-10-CM

## 2013-11-20 DIAGNOSIS — R112 Nausea with vomiting, unspecified: Secondary | ICD-10-CM | POA: Diagnosis not present

## 2013-11-20 DIAGNOSIS — Z880 Allergy status to penicillin: Secondary | ICD-10-CM | POA: Diagnosis not present

## 2013-11-20 DIAGNOSIS — R519 Headache, unspecified: Secondary | ICD-10-CM

## 2013-11-20 NOTE — ED Notes (Signed)
Pt is here for nausea and vomiting since Wednesday.  Pt states that she has not been able to keep anything down, also has generalized abdominal pain and some fever and chills, vomited in the parking lot on arrival

## 2013-11-20 NOTE — ED Notes (Signed)
Nausea and vomiting since yesterday, now with abd pains and diarrhea

## 2013-11-20 NOTE — ED Notes (Signed)
Pt given emesis bag and mask per pt request

## 2013-11-20 NOTE — Telephone Encounter (Signed)
Noted  

## 2013-11-20 NOTE — Telephone Encounter (Signed)
noted 

## 2013-11-21 DIAGNOSIS — R112 Nausea with vomiting, unspecified: Secondary | ICD-10-CM | POA: Diagnosis not present

## 2013-11-21 LAB — CBC WITH DIFFERENTIAL/PLATELET
Basophils Absolute: 0 10*3/uL (ref 0.0–0.1)
Basophils Relative: 0 % (ref 0–1)
Eosinophils Absolute: 0 10*3/uL (ref 0.0–0.7)
Eosinophils Relative: 0 % (ref 0–5)
HCT: 45.4 % (ref 36.0–46.0)
Hemoglobin: 16.2 g/dL — ABNORMAL HIGH (ref 12.0–15.0)
Lymphocytes Relative: 18 % (ref 12–46)
Lymphs Abs: 2.9 10*3/uL (ref 0.7–4.0)
MCH: 32.7 pg (ref 26.0–34.0)
MCHC: 35.7 g/dL (ref 30.0–36.0)
MCV: 91.7 fL (ref 78.0–100.0)
Monocytes Absolute: 1 10*3/uL (ref 0.1–1.0)
Monocytes Relative: 6 % (ref 3–12)
Neutro Abs: 12.2 10*3/uL — ABNORMAL HIGH (ref 1.7–7.7)
Neutrophils Relative %: 76 % (ref 43–77)
Platelets: 372 10*3/uL (ref 150–400)
RBC: 4.95 MIL/uL (ref 3.87–5.11)
RDW: 13.3 % (ref 11.5–15.5)
WBC: 16.1 10*3/uL — ABNORMAL HIGH (ref 4.0–10.5)

## 2013-11-21 LAB — COMPREHENSIVE METABOLIC PANEL
ALT: 9 U/L (ref 0–35)
AST: 12 U/L (ref 0–37)
Albumin: 4.7 g/dL (ref 3.5–5.2)
Alkaline Phosphatase: 126 U/L — ABNORMAL HIGH (ref 39–117)
Anion gap: 20 — ABNORMAL HIGH (ref 5–15)
BUN: 17 mg/dL (ref 6–23)
CO2: 23 mEq/L (ref 19–32)
Calcium: 10.8 mg/dL — ABNORMAL HIGH (ref 8.4–10.5)
Chloride: 94 mEq/L — ABNORMAL LOW (ref 96–112)
Creatinine, Ser: 0.74 mg/dL (ref 0.50–1.10)
GFR calc Af Amer: >90 mL/min (ref >90)
GFR calc non Af Amer: >90 mL/min (ref >90)
Glucose, Bld: 124 mg/dL — ABNORMAL HIGH (ref 70–99)
Potassium: 3.7 mEq/L (ref 3.7–5.3)
Sodium: 137 mEq/L (ref 137–147)
Total Bilirubin: 0.6 mg/dL (ref 0.3–1.2)
Total Protein: 8.6 g/dL — ABNORMAL HIGH (ref 6.0–8.3)

## 2013-11-21 LAB — URINALYSIS, ROUTINE W REFLEX MICROSCOPIC
Glucose, UA: NEGATIVE mg/dL
Ketones, ur: >80 mg/dL — AB
Leukocytes, UA: NEGATIVE
Nitrite: NEGATIVE
Protein, ur: 30 mg/dL — AB
Specific Gravity, Urine: 1.015 (ref 1.005–1.030)
Urobilinogen, UA: 1 mg/dL (ref 0.0–1.0)
pH: 7 (ref 5.0–8.0)

## 2013-11-21 LAB — URINE MICROSCOPIC-ADD ON

## 2013-11-21 MED ORDER — SODIUM CHLORIDE 0.9 % IV SOLN
1000.0000 mL | Freq: Once | INTRAVENOUS | Status: AC
  Administered 2013-11-21: 1000 mL via INTRAVENOUS

## 2013-11-21 MED ORDER — METOCLOPRAMIDE HCL 10 MG PO TABS: 10.0000 mg | ORAL_TABLET | Freq: Four times a day (QID) | ORAL | Status: DC | PRN

## 2013-11-21 MED ORDER — SODIUM CHLORIDE 0.9 % IV SOLN: 1000.0000 mL | Freq: Once | INTRAVENOUS | Status: DC

## 2013-11-21 MED ORDER — DIPHENHYDRAMINE HCL 50 MG/ML IJ SOLN
25.0000 mg | Freq: Once | INTRAMUSCULAR | Status: AC
  Administered 2013-11-21: 25 mg via INTRAVENOUS
  Filled 2013-11-21: qty 1

## 2013-11-21 MED ORDER — SODIUM CHLORIDE 0.9 % IV SOLN: 1000.0000 mL | INTRAVENOUS | Status: DC

## 2013-11-21 MED ORDER — METOCLOPRAMIDE HCL 5 MG/ML IJ SOLN
10.0000 mg | Freq: Once | INTRAMUSCULAR | Status: AC
  Administered 2013-11-21: 10 mg via INTRAVENOUS
  Filled 2013-11-21: qty 2

## 2013-11-21 NOTE — ED Provider Notes (Signed)
CSN: [id number]     Arrival date & time 11/20/13  2248 History   First MD Initiated Contact with Patient 11/21/13 0002    This chart was scribed for Delora Fuel, MD by Terressa Koyanagi, ED Scribe. This patient was seen in room [facility]11/[facility]11 and the patient's care was started at 12:08 AM.  Chief Complaint  Patient presents with  . Emesis   PCP: Tula Nakayama, MD  The history is provided by the patient. No language interpreter was used.   HPI Comments: Brooke Maddox is a 43 y.o. female , with an extensive medical Hx noted below, significant for HTN, seizure disorder, COPD, HLD, who presents to the Emergency Department complaining of intermittent n/v with associated abd pain, HA, decreased urine, fever and chills onset yesterday. Pt rates her abd pain a 10 out of 10 and rates her HA a 5 out of 10. Per triage vitals, pt's temperature was  100 F when she checked into the ED tonight. Pt reports she also had diarrhea for the past two days, however, it has now resolved. Pt reports the last time she urinated was last night around 9:00pm. Pt denies any sick contacts.   Past Medical History  Diagnosis Date  . Hypertension   . Arthritis   . Depression   . Anxiety associated with depression 2007    hospitalised for mental health problems   . Seizures     Dr. Theda Sers   . Migraines   . Chronic back pain   . History of seizure disorder   . History of leukocytosis   . Bipolar affective disorder   . Osteoarthritis     mild   . Abdominal pain, acute, left lower quadrant   . Chronic constipation   . Nausea   . Nicotine addiction   . Obesity   . Fatigue   . Acute knee pain     right   . Hyperlipidemia   . COPD (chronic obstructive pulmonary disease)   . Collagen vascular disease   . Renal insufficiency    Past Surgical History  Procedure Laterality Date  . Vesicovaginal fistula closure w/ tah  2009   Family History  Problem Relation Age of Onset  . COPD Father   . Hypertension Father   .  Arthritis Brother    History  Substance Use Topics  . Smoking status: Current Every Day Smoker -- 1.00 packs/day for 10 years    Types: Cigarettes  . Smokeless tobacco: Not on file  . Alcohol Use: No     Comment: for 11 years states that she has quit    OB History   Grav Para Term Preterm Abortions TAB SAB Ect Mult Living                 Review of Systems  Constitutional: Positive for fever and chills.  Gastrointestinal: Positive for nausea, vomiting and abdominal pain.  Genitourinary: Positive for decreased urine volume.  Neurological: Positive for headaches.  All other systems reviewed and are negative.     Allergies  Dilaudid; Lithium carbonate; Morphine and related; Sulfur; Codeine; Divalproex sodium; Fentanyl; Iodine; and Penicillins  Home Medications   Prior to Admission medications   Medication Sig Start Date End Date Taking? Authorizing Provider  ALPRAZolam Duanne Moron) 1 MG tablet Take 1 mg by mouth 3 (three) times daily.    Yes Historical Provider, MD  aspirin 81 MG chewable tablet Chew 81 mg by mouth daily.   Yes Historical Provider, MD  beclomethasone (  QVAR) 40 MCG/ACT inhaler Inhale 1 puff into the lungs 2 (two) times daily.   Yes Historical Provider, MD  cloNIDine (CATAPRES) 0.1 MG tablet TAKE 1 TABLET BY MOUTH EVERY NIGHT AT BEDTIME   Yes Fayrene Helper, MD  CRESTOR 20 MG tablet TAKE 1 TABLET BY MOUTH EVERY NIGHT AT BEDTIME 09/21/13  Yes Fayrene Helper, MD  fentaNYL (DURAGESIC - DOSED MCG/HR) 25 MCG/HR patch Place 1 patch (25 mcg total) onto the skin every 3 (three) days. 10/30/13  Yes Fayrene Helper, MD  FLUoxetine (PROZAC) 40 MG capsule Take 40 mg by mouth every morning.   Yes Historical Provider, MD  gabapentin (NEURONTIN) 100 MG capsule Take 1 capsule (100 mg total) by mouth 3 (three) times daily. 11/04/13  Yes Fayrene Helper, MD  lamoTRIgine (LAMICTAL) 150 MG tablet Take 300 mg by mouth every morning.    Yes Historical Provider, MD  levothyroxine  (SYNTHROID, LEVOTHROID) 25 MCG tablet TAKE 1 TABLET BY MOUTH EVERY DAY 09/21/13  Yes Fayrene Helper, MD  nitroGLYCERIN (NITROSTAT) 0.4 MG SL tablet Place 0.4 mg under the tongue every 5 (five) minutes as needed for chest pain.   Yes Historical Provider, MD  potassium chloride SA (K-DUR,KLOR-CON) 20 MEQ tablet Take 40 mEq by mouth 3 (three) times daily.   Yes Historical Provider, MD  tiotropium (SPIRIVA) 18 MCG inhalation capsule Place 18 mcg into inhaler and inhale daily.   Yes Historical Provider, MD   Triage Vitals: BP 135/101  Pulse 64  Temp(Src) 100 F (37.8 C) (Oral)  Resp 20  Ht 5\' 3"  (1.6 m)  Wt 160 lb (72.576 kg)  BMI 28.35 kg/m2  SpO2 99% Physical Exam  Nursing note and vitals reviewed. Constitutional: She is oriented to person, place, and time. She appears well-developed and well-nourished. No distress.  HENT:  Head: Normocephalic and atraumatic.  Eyes: Conjunctivae and EOM are normal. Pupils are equal, round, and reactive to light.  Neck: Normal range of motion. Neck supple. No JVD present. No tracheal deviation present.  Cardiovascular: Normal rate, regular rhythm and normal heart sounds.   No murmur heard. Pulmonary/Chest: Effort normal and breath sounds normal. No respiratory distress. She has no wheezes. She has no rales. She exhibits no tenderness.  Abdominal: She exhibits no distension. There is tenderness (mild tenderness diffusely ). There is no rebound and no guarding.  Reduced bowel sounds   Musculoskeletal: Normal range of motion. She exhibits no edema.  Lymphadenopathy:    She has no cervical adenopathy.  Neurological: She is alert and oriented to person, place, and time. She has normal reflexes. No cranial nerve deficit.  Skin: Skin is warm and dry. No rash noted.  Psychiatric: She has a normal mood and affect. Her behavior is normal. Thought content normal.    ED Course  Procedures (including critical care time) DIAGNOSTIC STUDIES: Oxygen Saturation is  99% on RA, nl by my interpretation.    COORDINATION OF CARE: 12:17 AM-Discussed treatment plan which includes IV fluids, meds, UA and labs with pt at bedside and pt agreed to plan.   Labs Review Results for orders placed during the hospital encounter of 11/20/13  URINALYSIS, ROUTINE W REFLEX MICROSCOPIC      Result Value Ref Range   Color, Urine YELLOW  YELLOW   APPearance CLEAR  CLEAR   Specific Gravity, Urine 1.015  1.005 - 1.030   pH 7.0  5.0 - 8.0   Glucose, UA NEGATIVE  NEGATIVE mg/dL   Hgb urine  dipstick MODERATE (*) NEGATIVE   Bilirubin Urine MODERATE (*) NEGATIVE   Ketones, ur >80 (*) NEGATIVE mg/dL   Protein, ur 30 (*) NEGATIVE mg/dL   Urobilinogen, UA 1.0  0.0 - 1.0 mg/dL   Nitrite NEGATIVE  NEGATIVE   Leukocytes, UA NEGATIVE  NEGATIVE  CBC WITH DIFFERENTIAL      Result Value Ref Range   WBC 16.1 (*) 4.0 - 10.5 K/uL   RBC 4.95  3.87 - 5.11 MIL/uL   Hemoglobin 16.2 (*) 12.0 - 15.0 g/dL   HCT 45.4  36.0 - 46.0 %   MCV 91.7  78.0 - 100.0 fL   MCH 32.7  26.0 - 34.0 pg   MCHC 35.7  30.0 - 36.0 g/dL   RDW 13.3  11.5 - 15.5 %   Platelets 372  150 - 400 K/uL   Neutrophils Relative % 76  43 - 77 %   Neutro Abs 12.2 (*) 1.7 - 7.7 K/uL   Lymphocytes Relative 18  12 - 46 %   Lymphs Abs 2.9  0.7 - 4.0 K/uL   Monocytes Relative 6  3 - 12 %   Monocytes Absolute 1.0  0.1 - 1.0 K/uL   Eosinophils Relative 0  0 - 5 %   Eosinophils Absolute 0.0  0.0 - 0.7 K/uL   Basophils Relative 0  0 - 1 %   Basophils Absolute 0.0  0.0 - 0.1 K/uL  COMPREHENSIVE METABOLIC PANEL      Result Value Ref Range   Sodium 137  137 - 147 mEq/L   Potassium 3.7  3.7 - 5.3 mEq/L   Chloride 94 (*) 96 - 112 mEq/L   CO2 23  19 - 32 mEq/L   Glucose, Bld 124 (*) 70 - 99 mg/dL   BUN 17  6 - 23 mg/dL   Creatinine, Ser 0.74  0.50 - 1.10 mg/dL   Calcium 10.8 (*) 8.4 - 10.5 mg/dL   Total Protein 8.6 (*) 6.0 - 8.3 g/dL   Albumin 4.7  3.5 - 5.2 g/dL   AST 12  0 - 37 U/L   ALT 9  0 - 35 U/L   Alkaline  Phosphatase 126 (*) 39 - 117 U/L   Total Bilirubin 0.6  0.3 - 1.2 mg/dL   GFR calc non Af Amer >90  >90 mL/min   GFR calc Af Amer >90  >90 mL/min   Anion gap 20 (*) 5 - 15  URINE MICROSCOPIC-ADD ON      Result Value Ref Range   Squamous Epithelial / LPF FEW (*) RARE   WBC, UA 3-6  <3 WBC/hpf   RBC / HPF 11-20  <3 RBC/hpf   Bacteria, UA FEW (*) RARE   MDM   Final diagnoses:  Nausea vomiting and diarrhea  Headache, unspecified headache type    Nausea and vomiting which likely is related to a viral gastroenteritis. Of note, she did have diarrhea which has resolved. Headache may be related to dehydration and could be migraine or muscle contraction headache. She will be given IV fluids and IV metoclopramide which should treat both headache and nausea.  She feels much better after above noted treatment. She is discharged with prescription for metoclopramide.  I personally performed the services described in this documentation, which was scribed in my presence. The recorded information has been reviewed and is accurate.     Delora Fuel, MD 74/16/38 [id number]

## 2013-11-21 NOTE — Discharge Instructions (Signed)
Nausea and Vomiting °Nausea is a sick feeling that often comes before throwing up (vomiting). Vomiting is a reflex where stomach contents come out of your mouth. Vomiting can cause severe loss of body fluids (dehydration). Children and elderly adults can become dehydrated quickly, especially if they also have diarrhea. Nausea and vomiting are symptoms of a condition or disease. It is important to find the cause of your symptoms. °CAUSES  °· Direct irritation of the stomach lining. This irritation can result from increased acid production (gastroesophageal reflux disease), infection, food poisoning, taking certain medicines (such as nonsteroidal anti-inflammatory drugs), alcohol use, or tobacco use. °· Signals from the brain. These signals could be caused by a headache, heat exposure, an inner ear disturbance, increased pressure in the brain from injury, infection, a tumor, or a concussion, pain, emotional stimulus, or metabolic problems. °· An obstruction in the gastrointestinal tract (bowel obstruction). °· Illnesses such as diabetes, hepatitis, gallbladder problems, appendicitis, kidney problems, cancer, sepsis, atypical symptoms of a heart attack, or eating disorders. °· Medical treatments such as chemotherapy and radiation. °· Receiving medicine that makes you sleep (general anesthetic) during surgery. °DIAGNOSIS °Your caregiver may ask for tests to be done if the problems do not improve after a few days. Tests may also be done if symptoms are severe or if the reason for the nausea and vomiting is not clear. Tests may include: °· Urine tests. °· Blood tests. °· Stool tests. °· Cultures (to look for evidence of infection). °· X-rays or other imaging studies. °Test results can help your caregiver make decisions about treatment or the need for additional tests. °TREATMENT °You need to stay well hydrated. Drink frequently but in small amounts. You may wish to drink water, sports drinks, clear broth, or eat frozen  ice pops or gelatin dessert to help stay hydrated. When you eat, eating slowly may help prevent nausea. There are also some antinausea medicines that may help prevent nausea. °HOME CARE INSTRUCTIONS  °· Take all medicine as directed by your caregiver. °· If you do not have an appetite, do not force yourself to eat. However, you must continue to drink fluids. °· If you have an appetite, eat a normal diet unless your caregiver tells you differently. °¨ Eat a variety of complex carbohydrates (rice, wheat, potatoes, bread), lean meats, yogurt, fruits, and vegetables. °¨ Avoid high-fat foods because they are more difficult to digest. °· Drink enough water and fluids to keep your urine clear or pale yellow. °· If you are dehydrated, ask your caregiver for specific rehydration instructions. Signs of dehydration may include: °¨ Severe thirst. °¨ Dry lips and mouth. °¨ Dizziness. °¨ Dark urine. °¨ Decreasing urine frequency and amount. °¨ Confusion. °¨ Rapid breathing or pulse. °SEEK IMMEDIATE MEDICAL CARE IF:  °· You have blood or brown flecks (like coffee grounds) in your vomit. °· You have black or bloody stools. °· You have a severe headache or stiff neck. °· You are confused. °· You have severe abdominal pain. °· You have chest pain or trouble breathing. °· You do not urinate at least once every 8 hours. °· You develop cold or clammy skin. °· You continue to vomit for longer than 24 to 48 hours. °· You have a fever. °MAKE SURE YOU:  °· Understand these instructions. °· Will watch your condition. °· Will get help right away if you are not doing well or get worse. °Document Released: 04/09/2005 Document Revised: 07/02/2011 Document Reviewed: 09/06/2010 °ExitCare® Patient Information ©2015 [employer]. This information is not intended   to replace advice given to you by your health care provider. Make sure you discuss any questions you have with your health care provider. ° °Diarrhea °Diarrhea is frequent loose and watery  bowel movements. It can cause you to feel weak and dehydrated. Dehydration can cause you to become tired and thirsty, have a dry mouth, and have decreased urination that often is dark yellow. Diarrhea is a sign of another problem, most often an infection that will not last long. In most cases, diarrhea typically lasts 2-3 days. However, it can last longer if it is a sign of something more serious. It is important to treat your diarrhea as directed by your caregiver to lessen or prevent future episodes of diarrhea. °CAUSES  °Some common causes include: °· Gastrointestinal infections caused by viruses, bacteria, or parasites. °· Food poisoning or food allergies. °· Certain medicines, such as antibiotics, chemotherapy, and laxatives. °· Artificial sweeteners and fructose. °· Digestive disorders. °HOME CARE INSTRUCTIONS °· Ensure adequate fluid intake (hydration): Have 1 cup (8 oz) of fluid for each diarrhea episode. Avoid fluids that contain simple sugars or sports drinks, fruit juices, whole milk products, and sodas. Your urine should be clear or pale yellow if you are drinking enough fluids. Hydrate with an oral rehydration solution that you can purchase at pharmacies, retail stores, and online. You can prepare an oral rehydration solution at home by mixing the following ingredients together: °¨  - tsp table salt. °¨ ¾ tsp baking soda. °¨  tsp salt substitute containing potassium chloride. °¨ 1  tablespoons sugar. °¨ 1 L (34 oz) of water. °· Certain foods and beverages may increase the speed at which food moves through the gastrointestinal (GI) tract. These foods and beverages should be avoided and include: °¨ Caffeinated and alcoholic beverages. °¨ High-fiber foods, such as raw fruits and vegetables, nuts, seeds, and whole grain breads and cereals. °¨ Foods and beverages sweetened with sugar alcohols, such as xylitol, sorbitol, and mannitol. °· Some foods may be well tolerated and may help thicken stool  including: °¨ Starchy foods, such as rice, toast, pasta, low-sugar cereal, oatmeal, grits, baked potatoes, crackers, and bagels. °¨ Bananas. °¨ Applesauce. °· Add probiotic-rich foods to help increase healthy bacteria in the GI tract, such as yogurt and fermented milk products. °· Wash your hands well after each diarrhea episode. °· Only take over-the-counter or prescription medicines as directed by your caregiver. °· Take a warm bath to relieve any burning or pain from frequent diarrhea episodes. °SEEK IMMEDIATE MEDICAL CARE IF:  °· You are unable to keep fluids down. °· You have persistent vomiting. °· You have blood in your stool, or your stools are black and tarry. °· You do not urinate in 6-8 hours, or there is only a small amount of very dark urine. °· You have abdominal pain that increases or localizes. °· You have weakness, dizziness, confusion, or light-headedness. °· You have a severe headache. °· Your diarrhea gets worse or does not get better. °· You have a fever or persistent symptoms for more than 2-3 days. °· You have a fever and your symptoms suddenly get worse. °MAKE SURE YOU:  °· Understand these instructions. °· Will watch your condition. °· Will get help right away if you are not doing well or get worse. °Document Released: 03/30/2002 Document Revised: 08/24/2013 Document Reviewed: 12/16/2011 °ExitCare® Patient Information ©2015 [employer]. This information is not intended to replace advice given to you by your health care provider. Make sure you discuss   any questions you have with your health care provider.  General Headache Without Cause A headache is pain or discomfort felt around the head or neck area. The specific cause of a headache may not be found. There are many causes and types of headaches. A few common ones are:  Tension headaches.  Migraine headaches.  Cluster headaches.  Chronic daily headaches. HOME CARE INSTRUCTIONS   Keep all follow-up appointments with your  caregiver or any specialist referral.  Only take over-the-counter or prescription medicines for pain or discomfort as directed by your caregiver.  Lie down in a dark, quiet room when you have a headache.  Keep a headache journal to find out what may trigger your migraine headaches. For example, write down:  What you eat and drink.  How much sleep you get.  Any change to your diet or medicines.  Try massage or other relaxation techniques.  Put ice packs or heat on the head and neck. Use these 3 to 4 times per day for 15 to 20 minutes each time, or as needed.  Limit stress.  Sit up straight, and do not tense your muscles.  Quit smoking if you smoke.  Limit alcohol use.  Decrease the amount of caffeine you drink, or stop drinking caffeine.  Eat and sleep on a regular schedule.  Get 7 to 9 hours of sleep, or as recommended by your caregiver.  Keep lights dim if bright lights bother you and make your headaches worse. SEEK MEDICAL CARE IF:   You have problems with the medicines you were prescribed.  Your medicines are not working.  You have a change from the usual headache.  You have nausea or vomiting. SEEK IMMEDIATE MEDICAL CARE IF:   Your headache becomes severe.  You have a fever.  You have a stiff neck.  You have loss of vision.  You have muscular weakness or loss of muscle control.  You start losing your balance or have trouble walking.  You feel faint or pass out.  You have severe symptoms that are different from your first symptoms. MAKE SURE YOU:   Understand these instructions.  Will watch your condition.  Will get help right away if you are not doing well or get worse. Document Released: 04/09/2005 Document Revised: 07/02/2011 Document Reviewed: 04/25/2011 [facility] Patient Information 2015 [place], [place]. This information is not intended to replace advice given to you by your health care provider. Make sure you discuss any questions you have  with your health care provider.  Metoclopramide tablets What is this medicine? METOCLOPRAMIDE (met oh kloe PRA mide) is used to treat the symptoms of gastroesophageal reflux disease (GERD) like heartburn. It is also used to treat people with slow emptying of the stomach and intestinal tract. This medicine may be used for other purposes; ask your health care provider or pharmacist if you have questions. COMMON BRAND NAME(S): Reglan What should I tell my health care provider before I take this medicine? They need to know if you have any of these conditions: -breast cancer -depression -diabetes -heart failure -high blood pressure -kidney disease -liver disease -Parkinson's disease or a movement disorder -pheochromocytoma -seizures -stomach obstruction, bleeding, or perforation -an unusual or allergic reaction to metoclopramide, procainamide, sulfites, other medicines, foods, dyes, or preservatives -pregnant or trying to get pregnant -breast-feeding How should I use this medicine? Take this medicine by mouth with a glass of water. Follow the directions on the prescription label. Take this medicine on an empty stomach, about 30 minutes  before eating. Take your doses at regular intervals. Do not take your medicine more often than directed. Do not stop taking except on the advice of your doctor or health care professional. A special MedGuide will be given to you by the pharmacist with each prescription and refill. Be sure to read this information carefully each time. Talk to your pediatrician regarding the use of this medicine in children. Special care may be needed. Overdosage: If you think you have taken too much of this medicine contact a poison control center or emergency room at once. NOTE: This medicine is only for you. Do not share this medicine with others. What if I miss a dose? If you miss a dose, take it as soon as you can. If it is almost time for your next dose, take only that  dose. Do not take double or extra doses. What may interact with this medicine? -acetaminophen -cyclosporine -digoxin -medicines for blood pressure -medicines for diabetes, including insulin -medicines for hay fever and other allergies -medicines for depression, especially an Monoamine Oxidase Inhibitor (MAOI) -medicines for Parkinson's disease, like levodopa -medicines for sleep or for pain -tetracycline This list may not describe all possible interactions. Give your health care provider a list of all the medicines, herbs, non-prescription drugs, or dietary supplements you use. Also tell them if you smoke, drink alcohol, or use illegal drugs. Some items may interact with your medicine. What should I watch for while using this medicine? It may take a few weeks for your stomach condition to start to get better. However, do not take this medicine for longer than 12 weeks. The longer you take this medicine, and the more you take it, the greater your chances are of developing serious side effects. If you are an elderly patient, a female patient, or you have diabetes, you may be at an increased risk for side effects from this medicine. Contact your doctor immediately if you start having movements you cannot control such as lip smacking, rapid movements of the tongue, involuntary or uncontrollable movements of the eyes, head, arms and legs, or muscle twitches and spasms. Patients and their families should watch out for worsening depression or thoughts of suicide. Also watch out for any sudden or severe changes in feelings such as feeling anxious, agitated, panicky, irritable, hostile, aggressive, impulsive, severely restless, overly excited and hyperactive, or not being able to sleep. If this happens, especially at the beginning of treatment or after a change in dose, call your doctor. Do not treat yourself for high fever. Ask your doctor or health care professional for advice. You may get drowsy or dizzy.  Do not drive, use machinery, or do anything that needs mental alertness until you know how this drug affects you. Do not stand or sit up quickly, especially if you are an older patient. This reduces the risk of dizzy or fainting spells. Alcohol can make you more drowsy and dizzy. Avoid alcoholic drinks. What side effects may I notice from receiving this medicine? Side effects that you should report to your doctor or health care professional as soon as possible: -allergic reactions like skin rash, itching or hives, swelling of the face, lips, or tongue -abnormal production of milk in females -breast enlargement in both males and females -change in the way you walk -difficulty moving, speaking or swallowing -drooling, lip smacking, or rapid movements of the tongue -excessive sweating -fever -involuntary or uncontrollable movements of the eyes, head, arms and legs -irregular heartbeat or palpitations -muscle twitches and  spasms °-unusually weak or tired °Side effects that usually do not require medical attention (report to your doctor or health care professional if they continue or are bothersome): °-change in sex drive or performance °-depressed mood °-diarrhea °-difficulty sleeping °-headache °-menstrual changes °-restless or nervous °This list may not describe all possible side effects. Call your doctor for medical advice about side effects. You may report side effects to FDA at 1-800-FDA-1088. °Where should I keep my medicine? °Keep out of the reach of children. °Store at room temperature between 20 and 25 degrees C (68 and 77 degrees F). Protect from light. Keep container tightly closed. Throw away any unused medicine after the expiration date. °NOTE: This sheet is a summary. It may not cover all possible information. If you have questions about this medicine, talk to your doctor, pharmacist, or health care provider. °© 2015, [employer]. (2011-08-07 13:04:38) ° °

## 2013-11-21 NOTE — ED Notes (Signed)
Pt advised that urine sample is needed

## 2013-11-23 DIAGNOSIS — F319 Bipolar disorder, unspecified: Secondary | ICD-10-CM | POA: Diagnosis not present

## 2013-11-27 ENCOUNTER — Other Ambulatory Visit: Payer: Self-pay

## 2013-11-27 DIAGNOSIS — M545 Low back pain, unspecified: Secondary | ICD-10-CM | POA: Diagnosis not present

## 2013-11-27 DIAGNOSIS — IMO0001 Reserved for inherently not codable concepts without codable children: Secondary | ICD-10-CM | POA: Diagnosis not present

## 2013-11-27 DIAGNOSIS — M4310 Spondylolisthesis, site unspecified: Secondary | ICD-10-CM | POA: Diagnosis not present

## 2013-11-27 MED ORDER — FENTANYL 25 MCG/HR TD PT72: 25.0000 ug | MEDICATED_PATCH | TRANSDERMAL | Status: DC

## 2013-12-08 ENCOUNTER — Encounter (HOSPITAL_COMMUNITY): Payer: Self-pay | Admitting: Emergency Medicine

## 2013-12-08 ENCOUNTER — Emergency Department (HOSPITAL_COMMUNITY)
Admission: EM | Admit: 2013-12-08 | Discharge: 2013-12-08 | Disposition: A | Discharge status: Home / Self Care | Payer: Medicare Other | Attending: Emergency Medicine | Admitting: Emergency Medicine

## 2013-12-08 DIAGNOSIS — M545 Low back pain, unspecified: Secondary | ICD-10-CM | POA: Diagnosis not present

## 2013-12-08 DIAGNOSIS — IMO0002 Reserved for concepts with insufficient information to code with codable children: Secondary | ICD-10-CM | POA: Insufficient documentation

## 2013-12-08 DIAGNOSIS — Y929 Unspecified place or not applicable: Secondary | ICD-10-CM | POA: Diagnosis not present

## 2013-12-08 DIAGNOSIS — J449 Chronic obstructive pulmonary disease, unspecified: Secondary | ICD-10-CM | POA: Diagnosis not present

## 2013-12-08 DIAGNOSIS — Z7982 Long term (current) use of aspirin: Secondary | ICD-10-CM | POA: Diagnosis not present

## 2013-12-08 DIAGNOSIS — I10 Essential (primary) hypertension: Secondary | ICD-10-CM | POA: Insufficient documentation

## 2013-12-08 DIAGNOSIS — Z87448 Personal history of other diseases of urinary system: Secondary | ICD-10-CM | POA: Insufficient documentation

## 2013-12-08 DIAGNOSIS — Z79899 Other long term (current) drug therapy: Secondary | ICD-10-CM | POA: Insufficient documentation

## 2013-12-08 DIAGNOSIS — Z880 Allergy status to penicillin: Secondary | ICD-10-CM | POA: Diagnosis not present

## 2013-12-08 DIAGNOSIS — Y939 Activity, unspecified: Secondary | ICD-10-CM | POA: Insufficient documentation

## 2013-12-08 DIAGNOSIS — X131XXA Other contact with steam and other hot vapors, initial encounter: Secondary | ICD-10-CM

## 2013-12-08 DIAGNOSIS — G43909 Migraine, unspecified, not intractable, without status migrainosus: Secondary | ICD-10-CM | POA: Diagnosis not present

## 2013-12-08 DIAGNOSIS — T22119A Burn of first degree of unspecified forearm, initial encounter: Secondary | ICD-10-CM | POA: Diagnosis not present

## 2013-12-08 DIAGNOSIS — M129 Arthropathy, unspecified: Secondary | ICD-10-CM | POA: Diagnosis not present

## 2013-12-08 DIAGNOSIS — X12XXXA Contact with other hot fluids, initial encounter: Secondary | ICD-10-CM | POA: Insufficient documentation

## 2013-12-08 DIAGNOSIS — J4489 Other specified chronic obstructive pulmonary disease: Secondary | ICD-10-CM | POA: Diagnosis not present

## 2013-12-08 DIAGNOSIS — F411 Generalized anxiety disorder: Secondary | ICD-10-CM | POA: Insufficient documentation

## 2013-12-08 DIAGNOSIS — E669 Obesity, unspecified: Secondary | ICD-10-CM | POA: Insufficient documentation

## 2013-12-08 DIAGNOSIS — Z862 Personal history of diseases of the blood and blood-forming organs and certain disorders involving the immune mechanism: Secondary | ICD-10-CM | POA: Diagnosis not present

## 2013-12-08 DIAGNOSIS — G8929 Other chronic pain: Secondary | ICD-10-CM | POA: Diagnosis not present

## 2013-12-08 DIAGNOSIS — T22111A Burn of first degree of right forearm, initial encounter: Secondary | ICD-10-CM

## 2013-12-08 DIAGNOSIS — M5416 Radiculopathy, lumbar region: Secondary | ICD-10-CM

## 2013-12-08 MED ORDER — KETOROLAC TROMETHAMINE 60 MG/2ML IM SOLN
60.0000 mg | Freq: Once | INTRAMUSCULAR | Status: AC
  Administered 2013-12-08: 60 mg via INTRAMUSCULAR
  Filled 2013-12-08: qty 2

## 2013-12-08 MED ORDER — CYCLOBENZAPRINE HCL 10 MG PO TABS: 10.0000 mg | ORAL_TABLET | Freq: Three times a day (TID) | ORAL | Status: DC | PRN

## 2013-12-08 MED ORDER — OXYCODONE-ACETAMINOPHEN 5-325 MG PO TABS: 1.0000 | ORAL_TABLET | ORAL | Status: DC | PRN

## 2013-12-08 MED ORDER — SILVER SULFADIAZINE 1 % EX CREA: TOPICAL_CREAM | CUTANEOUS | Status: DC
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
## 2013-12-08 NOTE — ED Notes (Signed)
Patient with no complaints at this time. Respirations even and unlabored. Skin warm/dry. Discharge instructions reviewed with patient at this time. Patient given opportunity to voice concerns/ask questions. Patient discharged at this time and left Emergency Department with steady gait.   

## 2013-12-08 NOTE — ED Notes (Signed)
Pt c/o lower back pain that radiates down left leg, denies any injury, states that she has problems with her back and is scheduled for surgery in [place] in two weeks,

## 2013-12-08 NOTE — Discharge Instructions (Signed)
Back Pain, Adult Back pain is very common. The pain often gets better over time. The cause of back pain is usually not dangerous. Most people can learn to manage their back pain on their own.  HOME CARE   Stay active. Start with short walks on flat ground if you can. Try to walk farther each day.  Do not sit, drive, or stand in one place for more than 30 minutes. Do not stay in bed.  Do not avoid exercise or work. Activity can help your back heal faster.  Be careful when you bend or lift an object. Bend at your knees, keep the object close to you, and do not twist.  Sleep on a firm mattress. Lie on your side, and bend your knees. If you lie on your back, put a pillow under your knees.  Only take medicines as told by your doctor.  Put ice on the injured area.  Put ice in a plastic bag.  Place a towel between your skin and the bag.  Leave the ice on for 15-20 minutes, 03-04 times a day for the first 2 to 3 days. After that, you can switch between ice and heat packs.  Ask your doctor about back exercises or massage.  Avoid feeling anxious or stressed. Find good ways to deal with stress, such as exercise. GET HELP RIGHT AWAY IF:   Your pain does not go away with rest or medicine.  Your pain does not go away in 1 week.  You have new problems.  You do not feel well.  The pain spreads into your legs.  You cannot control when you poop (bowel movement) or pee (urinate).  Your arms or legs feel weak or lose feeling (numbness).  You feel sick to your stomach (nauseous) or throw up (vomit).  You have belly (abdominal) pain.  You feel like you may pass out (faint). MAKE SURE YOU:   Understand these instructions.  Will watch your condition.  Will get help right away if you are not doing well or get worse. Document Released: 09/26/2007 Document Revised: 07/02/2011 Document Reviewed: 08/11/2013 [facility] Patient Information 2015 [place], [place]. This information is not intended  to replace advice given to you by your health care provider. Make sure you discuss any questions you have with your health care provider.  Burn Care Burns hurt your skin. When your skin is hurt, it is easier to get an infection. Follow your doctor's directions to help prevent an infection. HOME CARE  Wash your hands well before you change your bandage.  Change your bandage as often as told by your doctor.  Remove the old bandage. If the bandage sticks, soak it off with cool, clean water.  Gently clean the burn with mild soap and water.  Pat the burn dry with a clean, dry cloth.  Put a thin layer of medicated cream on the burn.  Put a clean bandage on as told by your doctor.  Keep the bandage clean and dry.  Raise (elevate) the burn for the first 24 hours. After that, follow your doctor's directions.  Only take medicine as told by your doctor. GET HELP RIGHT AWAY IF:   You have too much pain.  The skin near the burn is red, tender, puffy (swollen), or has red streaks.  The burn area has yellowish white fluid (pus) or a bad smell coming from it.  You have a fever. MAKE SURE YOU:   Understand these instructions.  Will watch your condition.  Will get help right away if you are not doing well or get worse. Document Released: 01/17/2008 Document Revised: 07/02/2011 Document Reviewed: 08/30/2010 [facility] Patient Information 2015 [place], [place]. This information is not intended to replace advice given to you by your health care provider. Make sure you discuss any questions you have with your health care provider.

## 2013-12-08 NOTE — ED Provider Notes (Signed)
Medical screening examination/treatment/procedure(s) were performed by non-physician practitioner and as supervising physician I was immediately available for consultation/collaboration.   EKG Interpretation None        Merryl Hacker, MD 12/08/13 1539

## 2013-12-11 ENCOUNTER — Telehealth: Payer: Self-pay

## 2013-12-11 DIAGNOSIS — M5440 Lumbago with sciatica, unspecified side: Secondary | ICD-10-CM

## 2013-12-11 MED ORDER — OXYCODONE-ACETAMINOPHEN 5-325 MG PO TABS: ORAL_TABLET | ORAL | Status: DC

## 2013-12-11 NOTE — Telephone Encounter (Signed)
Her ER rx said 1 every 4-6 hours. She took about 3 a day. She wants to be referred to [facility]

## 2013-12-11 NOTE — Telephone Encounter (Signed)
I habve referred her, let her klnow , I am printing 10 day supply of percocet she can collect this today

## 2013-12-11 NOTE — Telephone Encounter (Signed)
States she has a Publishing rights manager appt at [facility] Aug 27th. She has to go to the ER on Aug 18 because she was in so much pain. They gave her oxycodone 5/325 and she said that has been keeping her pain about a 5  But she is out and wants to know if you can write her enough to last until the 27th. Please advise

## 2013-12-11 NOTE — Telephone Encounter (Signed)
Patient aware and will come collect  

## 2013-12-11 NOTE — Addendum Note (Signed)
Addended by: Fayrene Helper on: 12/11/2013 10:43 AM   Modules accepted: Orders

## 2013-12-11 NOTE — Telephone Encounter (Signed)
pls ask pt how many oxycodone tablets she takes in a 24 hour period please.  Let me know  Also let her know she needs to tell me which pain clinmic she wants to be referred to for chronic pain management as her pain needs are escalating  Even though she is seeing neurosurg next week, I doubt that they will start writing her pain meds, so I need to have a name to refer her to locally due her uncontrolled pain needs, give her local and [facility] options, this NEEDS to happen , She does need pain management , thanks

## 2013-12-14 ENCOUNTER — Telehealth: Payer: Self-pay | Admitting: Family Medicine

## 2013-12-16 ENCOUNTER — Telehealth: Payer: Self-pay

## 2013-12-16 DIAGNOSIS — Z23 Encounter for immunization: Secondary | ICD-10-CM | POA: Diagnosis not present

## 2013-12-16 MED ORDER — CYCLOBENZAPRINE HCL 10 MG PO TABS: 10.0000 mg | ORAL_TABLET | Freq: Three times a day (TID) | ORAL | Status: DC | PRN

## 2013-12-16 NOTE — Telephone Encounter (Signed)
See previous message

## 2013-12-16 NOTE — Telephone Encounter (Signed)
Med refilled. Left message

## 2013-12-17 DIAGNOSIS — IMO0002 Reserved for concepts with insufficient information to code with codable children: Secondary | ICD-10-CM | POA: Diagnosis not present

## 2013-12-17 DIAGNOSIS — M545 Low back pain, unspecified: Secondary | ICD-10-CM | POA: Diagnosis not present

## 2013-12-18 ENCOUNTER — Telehealth: Payer: Self-pay | Admitting: Family Medicine

## 2013-12-18 NOTE — Telephone Encounter (Signed)
Patient did go to the Dr yesterday and they did a procedure on her and did not give her any pain medicine stated for her to call primary Dr if she need some she is not wanting any now but wants to say Thank you to Dr Moshe Cipro for all that she has done for her but she is going to have surgery she stated

## 2013-12-18 NOTE — Telephone Encounter (Signed)
noted 

## 2013-12-21 ENCOUNTER — Other Ambulatory Visit: Payer: Self-pay

## 2013-12-21 ENCOUNTER — Telehealth: Payer: Self-pay | Admitting: Family Medicine

## 2013-12-21 ENCOUNTER — Telehealth: Payer: Self-pay

## 2013-12-21 MED ORDER — ROSUVASTATIN CALCIUM 20 MG PO TABS: ORAL_TABLET | ORAL | Status: DC

## 2013-12-21 MED ORDER — LEVOTHYROXINE SODIUM 25 MCG PO TABS: ORAL_TABLET | ORAL | Status: DC

## 2013-12-21 MED ORDER — CLONIDINE HCL 0.1 MG PO TABS: ORAL_TABLET | ORAL | Status: DC

## 2013-12-21 MED ORDER — OXYCODONE-ACETAMINOPHEN 5-325 MG PO TABS: ORAL_TABLET | ORAL | Status: DC

## 2013-12-21 NOTE — Telephone Encounter (Signed)
Concern addressed

## 2013-12-21 NOTE — Telephone Encounter (Signed)
Has appt with [facility] Sept 30th. Wants enough pain med to last until then. Please advise

## 2013-12-21 NOTE — Telephone Encounter (Signed)
30 day supply printed per DR. Patient aware and will come collect

## 2014-01-06 ENCOUNTER — Other Ambulatory Visit: Payer: Self-pay | Admitting: Family Medicine

## 2014-01-20 DIAGNOSIS — M461 Sacroiliitis, not elsewhere classified: Secondary | ICD-10-CM | POA: Diagnosis not present

## 2014-01-20 DIAGNOSIS — M545 Low back pain, unspecified: Secondary | ICD-10-CM | POA: Diagnosis not present

## 2014-01-20 DIAGNOSIS — M4712 Other spondylosis with myelopathy, cervical region: Secondary | ICD-10-CM | POA: Diagnosis not present

## 2014-01-20 DIAGNOSIS — Z79899 Other long term (current) drug therapy: Secondary | ICD-10-CM | POA: Diagnosis not present

## 2014-01-20 DIAGNOSIS — G43009 Migraine without aura, not intractable, without status migrainosus: Secondary | ICD-10-CM | POA: Diagnosis not present

## 2014-02-01 ENCOUNTER — Telehealth: Payer: Self-pay

## 2014-02-01 ENCOUNTER — Other Ambulatory Visit: Payer: Self-pay | Admitting: Family Medicine

## 2014-02-01 DIAGNOSIS — Z1231 Encounter for screening mammogram for malignant neoplasm of breast: Secondary | ICD-10-CM

## 2014-02-01 DIAGNOSIS — Z139 Encounter for screening, unspecified: Secondary | ICD-10-CM

## 2014-02-01 NOTE — Telephone Encounter (Signed)
Order faxed.

## 2014-02-03 DIAGNOSIS — R7301 Impaired fasting glucose: Secondary | ICD-10-CM | POA: Diagnosis not present

## 2014-02-03 DIAGNOSIS — R946 Abnormal results of thyroid function studies: Secondary | ICD-10-CM | POA: Diagnosis not present

## 2014-02-03 DIAGNOSIS — E785 Hyperlipidemia, unspecified: Secondary | ICD-10-CM | POA: Diagnosis not present

## 2014-02-03 DIAGNOSIS — I10 Essential (primary) hypertension: Secondary | ICD-10-CM | POA: Diagnosis not present

## 2014-02-03 LAB — TSH: TSH: 1.402 u[IU]/mL (ref 0.350–4.500)

## 2014-02-03 LAB — HEMOGLOBIN A1C
Hgb A1c MFr Bld: 5.8 % — ABNORMAL HIGH (ref <5.7)
Mean Plasma Glucose: 120 mg/dL — ABNORMAL HIGH (ref <117)

## 2014-02-04 ENCOUNTER — Encounter (INDEPENDENT_AMBULATORY_CARE_PROVIDER_SITE_OTHER): Payer: Self-pay

## 2014-02-04 ENCOUNTER — Encounter: Payer: Self-pay | Admitting: Family Medicine

## 2014-02-04 ENCOUNTER — Ambulatory Visit (INDEPENDENT_AMBULATORY_CARE_PROVIDER_SITE_OTHER): Payer: Medicare Other | Admitting: Family Medicine

## 2014-02-04 VITALS — BP 124/82 | HR 82 | Resp 16 | Ht 63.0 in | Wt 178.0 lb

## 2014-02-04 DIAGNOSIS — M549 Dorsalgia, unspecified: Secondary | ICD-10-CM | POA: Diagnosis not present

## 2014-02-04 DIAGNOSIS — Z0000 Encounter for general adult medical examination without abnormal findings: Secondary | ICD-10-CM

## 2014-02-04 LAB — LIPID PANEL
Cholesterol: 170 mg/dL (ref 0–200)
HDL: 54 mg/dL (ref >39)
LDL Cholesterol: 93 mg/dL (ref 0–99)
Total CHOL/HDL Ratio: 3.1 Ratio
Triglycerides: 117 mg/dL (ref <150)
VLDL: 23 mg/dL (ref 0–40)

## 2014-02-04 LAB — COMPREHENSIVE METABOLIC PANEL
ALT: 15 U/L (ref 0–35)
AST: 16 U/L (ref 0–37)
Albumin: 3.9 g/dL (ref 3.5–5.2)
Alkaline Phosphatase: 113 U/L (ref 39–117)
BUN: 8 mg/dL (ref 6–23)
CO2: 27 mEq/L (ref 19–32)
Calcium: 9.6 mg/dL (ref 8.4–10.5)
Chloride: 102 mEq/L (ref 96–112)
Creat: 0.6 mg/dL (ref 0.50–1.10)
Glucose, Bld: 112 mg/dL — ABNORMAL HIGH (ref 70–99)
Potassium: 4.3 mEq/L (ref 3.5–5.3)
Sodium: 139 mEq/L (ref 135–145)
Total Bilirubin: 0.3 mg/dL (ref 0.2–1.2)
Total Protein: 6.5 g/dL (ref 6.0–8.3)

## 2014-02-04 MED ORDER — METHYLPREDNISOLONE ACETATE 80 MG/ML IJ SUSP
80.0000 mg | Freq: Once | INTRAMUSCULAR | Status: AC
  Administered 2014-02-04: 80 mg via INTRAMUSCULAR

## 2014-02-04 NOTE — Patient Instructions (Signed)
F/u in 4 month, call if you need me before  Depo medrol 80g Im today for back pain  All pain medcation from neurology only  Recent labs are excellent, no change in meds. Blood sugar only slightly elevated, cholesterol, liver and kidney are good, also thyroid test is within normal  Please work on smoking cessation  Be careful about falling and keep appointments with your mental health Provider

## 2014-02-04 NOTE — Progress Notes (Signed)
Subjective:    Patient ID: Brooke Maddox, female    DOB: 07/25/1970, 43 y.o.   MRN: [id number]  HPI  Preventive Screening-Counseling & Management   Patient present here today for an initial  Medicare annual wellness visit. Requests injection for back pian , which is administerd   Current Problems (verified)   Medications Prior to Visit Allergies (verified)   PAST HISTORY  Family History (verified)   Social History Divorced with 2 grown kids and 1 grand daughter.    Risk Factors  Current exercise habits: Currently walks daily some but can't walk long because of her back , also dyspneic due to COPD   Dietary issues discussed: Heart healthy diet, encouraged to eat more fruits and vegetables and limit fried fatty foods and red meat   Cardiac risk factors: nicotine  Depression Screen  (Note: if answer to either of the following is "Yes", a more complete depression screening is indicated)   Over the past two weeks, have you felt down, depressed or hopeless? yes Over the past two weeks, have you felt little interest or pleasure in doing things? yes Have you lost interest or pleasure in daily life?  Do you often feel hopeless? Sometimes  Do you cry easily over simple problems? No  Hospitalized for 9 days this year for mental health, being treated by psychiatry Activities of Daily Living  In your present state of health, do you have any difficulty performing the following activities?  Driving?: No Managing money?: No Feeding yourself?:No Getting from bed to chair?:No Climbing a flight of stairs?: very slowly , due to chronic low back pain from arthritis and disc disease Preparing food and eating?:No Bathing or showering?:No Getting dressed?:No Getting to the toilet?:No Using the toilet?:No Moving around from place to place?: No  Fall Risk Assessment In the past year have you fallen or had a near fall?:yes Are you currently taking any medications that make you  dizzy?:yes   Hearing Difficulties: No Do you often ask people to speak up or repeat themselves?:sometimes, some hearing loss in right ear  Do you experience ringing or noises in your ears?:No Do you have difficulty understanding soft or whispered voices?: sometimes   Cognitive Testing  Alert? Yes Normal Appearance?Yes  Oriented to person? Yes Place? Yes  Time? Yes  Displays appropriate judgment?Yes  Can read the correct time from a watch face? yes Are you having problems remembering things?No  Advanced Directives have been discussed with the patient?Yes, is working on getting one   List the Names of Other Physician/Practitioners you currently use:  Dr Merlene Laughter (pain clinic) Dr Jeanell Sparrow (Psych)  Indicate any recent Medical Services you may have received from other than [facility] providers in the past year (date may be approximate).   Assessment:    Annual Wellness Exam   Plan:      Medicare Attestation  I have personally reviewed:  The patient's medical and social history  Their use of alcohol, tobacco or illicit drugs  Their current medications and supplements  The patient's functional ability including ADLs,fall risks, home safety risks, cognitive, and hearing and visual impairment  Diet and physical activities  Evidence for depression or mood disorders  The patient's weight, height, BMI, and visual acuity have been recorded in the chart. I have made referrals, counseling, and provided education to the patient based on review of the above and I have provided the patient with a written personalized care plan for preventive services.     Review  of Systems     Objective:   Physical Exam BP 124/82  Pulse 82  Resp 16  Ht 5\' 3"  (1.6 m)  Wt 178 lb (80.74 kg)  BMI 31.54 kg/m2  SpO2 97% Pt excessively drowsy, all pain medication is from pain clinic and neurologist, wills end note making him aware of my clinical impression, that she may be over sedated , she is in agreement     MS: Decreased ROM lumbar spine, due to pain also has abnormal gait due to osteoarhtirits of spine an d disc disease      Assessment & Plan:  Medicare annual wellness visit, initial Annual exam as documented. Counseling done  re healthy lifestyle involving commitment to 150 minutes exercise per week, heart healthy diet, and attaining healthy weight.The importance of adequate sleep also discussed. Regular seat belt use and home safety, is also discussed. Changes in health habits are decided on by the patient with goals and time frames  set for achieving them. Immunization and cancer screening needs are specifically addressed at this visit.   Back pain with radiation Chronic pain management through psych/ pain management , appears over sedated Depo medrol in office today , pt c/o uncontorlled pain , thogh sedated, states the anti inflammatory injection helps alot

## 2014-02-06 DIAGNOSIS — Z0000 Encounter for general adult medical examination without abnormal findings: Secondary | ICD-10-CM | POA: Insufficient documentation

## 2014-02-06 NOTE — Assessment & Plan Note (Signed)
Chronic pain management through psych/ pain management , appears over sedated Depo medrol in office today , pt c/o uncontorlled pain , thogh sedated, states the anti inflammatory injection helps alot

## 2014-02-06 NOTE — Assessment & Plan Note (Signed)

## 2014-02-09 DIAGNOSIS — F319 Bipolar disorder, unspecified: Secondary | ICD-10-CM | POA: Diagnosis not present

## 2014-02-18 DIAGNOSIS — Z79899 Other long term (current) drug therapy: Secondary | ICD-10-CM | POA: Diagnosis not present

## 2014-02-18 DIAGNOSIS — M545 Low back pain: Secondary | ICD-10-CM | POA: Diagnosis not present

## 2014-02-18 DIAGNOSIS — M533 Sacrococcygeal disorders, not elsewhere classified: Secondary | ICD-10-CM | POA: Diagnosis not present

## 2014-02-18 DIAGNOSIS — G43901 Migraine, unspecified, not intractable, with status migrainosus: Secondary | ICD-10-CM | POA: Diagnosis not present

## 2014-02-26 ENCOUNTER — Ambulatory Visit (HOSPITAL_COMMUNITY)
Admission: RE | Admit: 2014-02-26 | Discharge: 2014-02-26 | Disposition: A | Discharge status: Home / Self Care | Payer: Medicare Other | Source: Ambulatory Visit | Attending: Family Medicine | Admitting: Family Medicine

## 2014-02-26 DIAGNOSIS — Z1231 Encounter for screening mammogram for malignant neoplasm of breast: Secondary | ICD-10-CM | POA: Insufficient documentation

## 2014-03-02 ENCOUNTER — Ambulatory Visit: Payer: Medicare Other | Admitting: Family Medicine

## 2014-03-02 ENCOUNTER — Encounter: Payer: Self-pay | Admitting: *Deleted

## 2014-03-22 DIAGNOSIS — M545 Low back pain: Secondary | ICD-10-CM | POA: Diagnosis not present

## 2014-03-22 DIAGNOSIS — Z79899 Other long term (current) drug therapy: Secondary | ICD-10-CM | POA: Diagnosis not present

## 2014-03-22 DIAGNOSIS — G43901 Migraine, unspecified, not intractable, with status migrainosus: Secondary | ICD-10-CM | POA: Diagnosis not present

## 2014-03-22 DIAGNOSIS — M533 Sacrococcygeal disorders, not elsewhere classified: Secondary | ICD-10-CM | POA: Diagnosis not present

## 2014-04-01 ENCOUNTER — Encounter (HOSPITAL_COMMUNITY): Payer: Self-pay | Admitting: Cardiology

## 2014-04-06 ENCOUNTER — Encounter: Payer: Self-pay | Admitting: Cardiology

## 2014-04-06 ENCOUNTER — Ambulatory Visit (INDEPENDENT_AMBULATORY_CARE_PROVIDER_SITE_OTHER): Payer: Medicare Other | Admitting: Cardiology

## 2014-04-06 VITALS — BP 92/64 | HR 72 | Ht 62.0 in | Wt 182.0 lb

## 2014-04-06 DIAGNOSIS — I10 Essential (primary) hypertension: Secondary | ICD-10-CM | POA: Diagnosis not present

## 2014-04-06 DIAGNOSIS — R0789 Other chest pain: Secondary | ICD-10-CM | POA: Diagnosis not present

## 2014-04-06 NOTE — Patient Instructions (Signed)
   Follow up as needed.   Your physician recommends that you continue on your current medications as directed. Please refer to the Current Medication list given to you today.  Thank you for choosing [facility]!

## 2014-04-06 NOTE — Progress Notes (Signed)
Clinical Summary Ms. Essex is a 43 y.o.female seen today for follow up of the following medical problems.   1. Abnormal EKG/Chest pain  - patient seen previously for chest pain - Multiple CAD risk factors: HTN, HL, tobacco, father MI age 53 - Stress MPI technically difficult, suggestion of possible defects. Referred for cath 03/17/13 with patent coronaries.  - since last visit denies any change in symptoms, still with intermittent atypical chest pain at times.       Past Medical History  Diagnosis Date  . Hypertension   . Arthritis   . Depression   . Anxiety associated with depression 2007    hospitalised for mental health problems   . Seizures     Dr. Theda Sers   . Migraines   . Chronic back pain   . History of seizure disorder   . History of leukocytosis   . Bipolar affective disorder   . Osteoarthritis     mild   . Abdominal pain, acute, left lower quadrant   . Chronic constipation   . Nausea   . Nicotine addiction   . Obesity   . Fatigue   . Acute knee pain     right   . Hyperlipidemia   . COPD (chronic obstructive pulmonary disease)   . Collagen vascular disease   . Renal insufficiency      Allergies  Allergen Reactions  . Dilaudid [Hydromorphone]     Gives patient a huge headache  . Lithium Carbonate Other (See Comments)    Shakes   . Morphine And Related Other (See Comments)    Extended Drowsiness.   . Sulfur     Rash   . Tylenol [Acetaminophen]     Pt states that regular tylenol makes her sick but she can take tylenol when mixed with another medication such as percocet or Vicodin she does not get sick,   . Codeine Nausea And Vomiting  . Divalproex Sodium Rash  . Fentanyl Rash  . Iodine Rash  . Penicillins Nausea And Vomiting and Rash     Current Outpatient Prescriptions  Medication Sig Dispense Refill  . ALPRAZolam (XANAX) 1 MG tablet Take 1 mg by mouth 3 (three) times daily.     Marland Kitchen aspirin 81 MG chewable tablet Chew 81 mg by mouth  daily.    . beclomethasone (QVAR) 40 MCG/ACT inhaler Inhale 1 puff into the lungs 2 (two) times daily.    . cloNIDine (CATAPRES) 0.1 MG tablet TAKE 1 TABLET BY MOUTH EVERY NIGHT AT BEDTIME 30 tablet 4  . cyclobenzaprine (FLEXERIL) 10 MG tablet TAKE 1 TABLET BY MOUTH THREE TIMES DAILY AS NEEDED 21 tablet 0  . FLUoxetine (PROZAC) 40 MG capsule Take 40 mg by mouth every morning.    . gabapentin (NEURONTIN) 300 MG capsule Take six tabs daily    . lamoTRIgine (LAMICTAL) 150 MG tablet Take 300 mg by mouth every morning.     Marland Kitchen levothyroxine (SYNTHROID, LEVOTHROID) 25 MCG tablet TAKE 1 TABLET BY MOUTH EVERY DAY 30 tablet 4  . metoCLOPramide (REGLAN) 10 MG tablet Take 1 tablet (10 mg total) by mouth every 6 (six) hours as needed for nausea (or headache). 30 tablet 0  . nitroGLYCERIN (NITROSTAT) 0.4 MG SL tablet Place 0.4 mg under the tongue every 5 (five) minutes as needed for chest pain.    Marland Kitchen oxyCODONE-acetaminophen (PERCOCET) 5-325 MG per tablet One tablet three times daily for uncontrolled back pain 90 tablet 0  . potassium chloride  SA (K-DUR,KLOR-CON) 20 MEQ tablet Take 40 mEq by mouth 3 (three) times daily.    . rosuvastatin (CRESTOR) 20 MG tablet TAKE 1 TABLET BY MOUTH EVERY NIGHT AT BEDTIME 30 tablet 4  . silver sulfADIAZINE (SILVADENE) 1 % cream Wash off and apply the cream twice daily 50 g 0  . tiotropium (SPIRIVA) 18 MCG inhalation capsule Place 18 mcg into inhaler and inhale daily.     No current facility-administered medications for this visit.     Past Surgical History  Procedure Laterality Date  . Vesicovaginal fistula closure w/ tah  2009  . Abdominal hysterectomy    . Left heart catheterization with coronary angiogram N/A 03/17/2013    Procedure: LEFT HEART CATHETERIZATION WITH CORONARY ANGIOGRAM;  Surgeon: Peter M Martinique, MD;  Location[facility] CATH LAB;  Service: Cardiovascular;  Laterality: N/A;     Allergies  Allergen Reactions  . Dilaudid [Hydromorphone]     Gives patient a  huge headache  . Lithium Carbonate Other (See Comments)    Shakes   . Morphine And Related Other (See Comments)    Extended Drowsiness.   . Sulfur     Rash   . Tylenol [Acetaminophen]     Pt states that regular tylenol makes her sick but she can take tylenol when mixed with another medication such as percocet or Vicodin she does not get sick,   . Codeine Nausea And Vomiting  . Divalproex Sodium Rash  . Fentanyl Rash  . Iodine Rash  . Penicillins Nausea And Vomiting and Rash      Family History  Problem Relation Age of Onset  . COPD Father   . Hypertension Father   . Arthritis Brother      Social History Ms. Basilio reports that she has been smoking Cigarettes.  She has a 10 pack-year smoking history. She does not have any smokeless tobacco history on file. Ms. Galyon reports that she does not drink alcohol.   Review of Systems CONSTITUTIONAL: No weight loss, fever, chills, weakness or fatigue.  HEENT: Eyes: No visual loss, blurred vision, double vision or yellow sclerae.No hearing loss, sneezing, congestion, runny nose or sore throat.  SKIN: No rash or itching.  CARDIOVASCULAR: per HPI RESPIRATORY: No shortness of breath, cough or sputum.  GASTROINTESTINAL: No anorexia, nausea, vomiting or diarrhea. No abdominal pain or blood.  GENITOURINARY: No burning on urination, no polyuria NEUROLOGICAL: No headache, dizziness, syncope, paralysis, ataxia, numbness or tingling in the extremities. No change in bowel or bladder control.  MUSCULOSKELETAL: + back pain LYMPHATICS: No enlarged nodes. No history of splenectomy.  PSYCHIATRIC: No history of depression or anxiety.  ENDOCRINOLOGIC: No reports of sweating, cold or heat intolerance. No polyuria or polydipsia.  Marland Kitchen   Physical Examination p 72 bp 92/64 Wt 182 lbs BMI 33 Gen: resting comfortably, no acute distress HEENT: no scleral icterus, pupils equal round and reactive, no palptable cervical adenopathy,  CV: RRR, on m/r/g, no  JVD Resp: Clear to auscultation bilaterally GI: abdomen is soft, non-tender, non-distended, normal bowel sounds, no hepatosplenomegaly MSK: extremities are warm, no edema.  Skin: warm, no rash Neuro:  no focal deficits Psych: appropriate affect   Diagnostic Studies  02/27/13 Stress MPI IMPRESSION: 1. Abnormal Lexiscan Cardiolite study. 2. Marked GI uptake obscuring the inferior wall. Significant reverse redistribution. 3. Moderate fixed apical/anteroapical defect with mild hypokinesis, suggestive of myocardial scar. 4. Low normal LV systolic function, calculated EF 51%. The aforementioned areas were mildly hypokinetic. Remaining wall motion was otherwise normal.  5. Clinical correlation is highly recommended.    02/2013 Cath Procedural Findings:  Hemodynamics:  AO 104/56 mean 79 mm Hg  LV 103/12 mm Hg  Coronary angiography:  Coronary dominance: left  Left mainstem: Normal  Left anterior descending (LAD): Normal  Left circumflex (LCx): large dominant vessel gives rise to 2 OMs, 2 PLOMs, and PDA. It is normal throughout.  Right coronary artery (RCA): Normal.  Left ventriculography: Left ventricular systolic function is normal, LVEF is estimated at 55-65%, there is no significant mitral regurgitation  Final Conclusions:  1. Normal coronary anatomy.  2. Normal LV function.  02/2013 Echo Study Conclusions  - Left ventricle: The cavity size was normal. Wall thickness was normal. Systolic function was normal. The estimated ejection fraction was in the range of 55% to 60%. Wall motion was normal; there were no regional wall motion abnormalities. Features are consistent with a pseudonormal left ventricular filling pattern, with concomitant abnormal relaxation and increased filling pressure (grade 2 diastolic dysfunction). - Left atrium: The atrium was at the upper limits of normal in size. - Right atrium: Central venous pressure: 83mm Hg  (est). - Tricuspid valve: Physiologic regurgitation. - Pulmonary arteries: PA peak pressure: 4mm Hg (S). - Pericardium, extracardiac: There was no pericardial effusion. Impressions:  - No prior study for comparison. Normal LV wall thickness with LVEF 55-60%, probable grade 2 diastolic dysfunction. Upper normal left atrial size. No major valvular abnormalities. PASP 23 mmHg. No pericardial effusion.   Assessment and Plan  1. Chest pain  - noncardiac chest pain, recent cath 02/2013 with patent coronaries - patient with chronic pain, symptoms likely related to this.  - no further cardiac testing or interventions indicated at this time, she may follow up as needed.       Arnoldo Lenis, M.D.

## 2014-04-21 DIAGNOSIS — M519 Unspecified thoracic, thoracolumbar and lumbosacral intervertebral disc disorder: Secondary | ICD-10-CM | POA: Diagnosis not present

## 2014-04-21 DIAGNOSIS — Z79899 Other long term (current) drug therapy: Secondary | ICD-10-CM | POA: Diagnosis not present

## 2014-04-21 DIAGNOSIS — M533 Sacrococcygeal disorders, not elsewhere classified: Secondary | ICD-10-CM | POA: Diagnosis not present

## 2014-04-21 DIAGNOSIS — G43901 Migraine, unspecified, not intractable, with status migrainosus: Secondary | ICD-10-CM | POA: Diagnosis not present

## 2014-04-21 DIAGNOSIS — M545 Low back pain: Secondary | ICD-10-CM | POA: Diagnosis not present

## 2014-05-02 ENCOUNTER — Ambulatory Visit (HOSPITAL_COMMUNITY)
Admission: RE | Admit: 2014-05-02 | Discharge: 2014-05-02 | Disposition: A | Discharge status: Home / Self Care | Payer: Medicare Other | Source: Ambulatory Visit | Attending: Emergency Medicine | Admitting: Emergency Medicine

## 2014-05-02 ENCOUNTER — Encounter (HOSPITAL_COMMUNITY): Payer: Self-pay | Admitting: *Deleted

## 2014-05-02 ENCOUNTER — Emergency Department (HOSPITAL_COMMUNITY)
Admission: EM | Admit: 2014-05-02 | Discharge: 2014-05-02 | Disposition: A | Discharge status: Home / Self Care | Payer: Medicare Other | Attending: Emergency Medicine | Admitting: Emergency Medicine

## 2014-05-02 DIAGNOSIS — Z7951 Long term (current) use of inhaled steroids: Secondary | ICD-10-CM | POA: Diagnosis not present

## 2014-05-02 DIAGNOSIS — L03116 Cellulitis of left lower limb: Secondary | ICD-10-CM | POA: Insufficient documentation

## 2014-05-02 DIAGNOSIS — Z87891 Personal history of nicotine dependence: Secondary | ICD-10-CM | POA: Diagnosis not present

## 2014-05-02 DIAGNOSIS — Z7982 Long term (current) use of aspirin: Secondary | ICD-10-CM | POA: Diagnosis not present

## 2014-05-02 DIAGNOSIS — Z7952 Long term (current) use of systemic steroids: Secondary | ICD-10-CM | POA: Insufficient documentation

## 2014-05-02 DIAGNOSIS — G8929 Other chronic pain: Secondary | ICD-10-CM | POA: Insufficient documentation

## 2014-05-02 DIAGNOSIS — Z9889 Other specified postprocedural states: Secondary | ICD-10-CM | POA: Insufficient documentation

## 2014-05-02 DIAGNOSIS — Z87448 Personal history of other diseases of urinary system: Secondary | ICD-10-CM | POA: Diagnosis not present

## 2014-05-02 DIAGNOSIS — M79605 Pain in left leg: Secondary | ICD-10-CM | POA: Diagnosis not present

## 2014-05-02 DIAGNOSIS — I10 Essential (primary) hypertension: Secondary | ICD-10-CM | POA: Diagnosis not present

## 2014-05-02 DIAGNOSIS — M1990 Unspecified osteoarthritis, unspecified site: Secondary | ICD-10-CM | POA: Diagnosis not present

## 2014-05-02 DIAGNOSIS — Z8719 Personal history of other diseases of the digestive system: Secondary | ICD-10-CM | POA: Insufficient documentation

## 2014-05-02 DIAGNOSIS — Z720 Tobacco use: Secondary | ICD-10-CM | POA: Diagnosis not present

## 2014-05-02 DIAGNOSIS — G43909 Migraine, unspecified, not intractable, without status migrainosus: Secondary | ICD-10-CM | POA: Insufficient documentation

## 2014-05-02 DIAGNOSIS — L539 Erythematous condition, unspecified: Secondary | ICD-10-CM | POA: Diagnosis not present

## 2014-05-02 DIAGNOSIS — Z79899 Other long term (current) drug therapy: Secondary | ICD-10-CM | POA: Diagnosis not present

## 2014-05-02 DIAGNOSIS — E785 Hyperlipidemia, unspecified: Secondary | ICD-10-CM | POA: Diagnosis not present

## 2014-05-02 DIAGNOSIS — F418 Other specified anxiety disorders: Secondary | ICD-10-CM | POA: Insufficient documentation

## 2014-05-02 DIAGNOSIS — E669 Obesity, unspecified: Secondary | ICD-10-CM | POA: Insufficient documentation

## 2014-05-02 DIAGNOSIS — J449 Chronic obstructive pulmonary disease, unspecified: Secondary | ICD-10-CM | POA: Insufficient documentation

## 2014-05-02 DIAGNOSIS — Z862 Personal history of diseases of the blood and blood-forming organs and certain disorders involving the immune mechanism: Secondary | ICD-10-CM | POA: Diagnosis not present

## 2014-05-02 MED ORDER — CEPHALEXIN 500 MG PO CAPS: 500.0000 mg | ORAL_CAPSULE | Freq: Four times a day (QID) | ORAL | Status: DC

## 2014-05-02 NOTE — ED Notes (Signed)
Pt reports having back problems but pain in her left leg has become worse. Pt states has felt like her leg has a fever it it.

## 2014-05-02 NOTE — ED Notes (Signed)
Pt alert & oriented x4, stable gait. Patient given discharge instructions, paperwork & prescription(s). Patient  instructed to stop at the registration desk to finish any additional paperwork. Patient verbalized understanding. Pt left department w/ no further questions. 

## 2014-05-02 NOTE — ED Provider Notes (Signed)
CSN: [id number]     Arrival date & time 05/02/14  0030 History   First MD Initiated Contact with Patient 05/02/14 [phone]     Chief Complaint  Patient presents with  . Leg Pain     (Consider location/radiation/quality/duration/timing/severity/associated sxs/prior Treatment) HPI Comments: Patient is a 44 year old female with history of hypertension, depression, seizures, migraines, chronic back pain, bipolar. She presents with complaints of burning and discoloration to the anterior left lower leg in the absence of any injury or trauma. She denies any chest pain or shortness of breath. She denies any bowel or bladder complaints.  Patient is a 44 y.o. female presenting with leg pain. The history is provided by the patient.  Leg Pain Location:  Leg Injury: no   Leg location:  L leg Pain details:    Quality:  Aching   Radiates to:  Does not radiate   Severity:  Moderate   Onset quality:  Gradual   Duration:  2 days   Timing:  Constant   Progression:  Worsening Chronicity:  New   Past Medical History  Diagnosis Date  . Hypertension   . Arthritis   . Depression   . Anxiety associated with depression 2007    hospitalised for mental health problems   . Seizures     Dr. Theda Sers   . Migraines   . Chronic back pain   . History of seizure disorder   . History of leukocytosis   . Bipolar affective disorder   . Osteoarthritis     mild   . Abdominal pain, acute, left lower quadrant   . Chronic constipation   . Nausea   . Nicotine addiction   . Obesity   . Fatigue   . Acute knee pain     right   . Hyperlipidemia   . COPD (chronic obstructive pulmonary disease)   . Collagen vascular disease   . Renal insufficiency    Past Surgical History  Procedure Laterality Date  . Vesicovaginal fistula closure w/ tah  2009  . Abdominal hysterectomy    . Left heart catheterization with coronary angiogram N/A 03/17/2013    Procedure: LEFT HEART CATHETERIZATION WITH CORONARY ANGIOGRAM;   Surgeon: Peter M Martinique, MD;  Location: [facility] CATH LAB;  Service: Cardiovascular;  Laterality: N/A;   Family History  Problem Relation Age of Onset  . COPD Father   . Hypertension Father   . Arthritis Brother    History  Substance Use Topics  . Smoking status: Current Every Day Smoker -- 1.00 packs/day for 10 years    Types: Cigarettes    Start date: 08/30/1986  . Smokeless tobacco: Never Used  . Alcohol Use: No     Comment: for 11 years states that she has quit    OB History    No data available     Review of Systems  All other systems reviewed and are negative.     Allergies  Dilaudid; Lithium carbonate; Morphine and related; Sulfur; Tylenol; Codeine; Divalproex sodium; Fentanyl; Iodine; and Penicillins  Home Medications   Prior to Admission medications   Medication Sig Start Date End Date Taking? Authorizing Provider  ALPRAZolam Duanne Moron) 1 MG tablet Take 1 mg by mouth 3 (three) times daily.     Historical Provider, MD  aspirin 81 MG chewable tablet Chew 81 mg by mouth daily.    Historical Provider, MD  beclomethasone (QVAR) 40 MCG/ACT inhaler Inhale 1 puff into the lungs 2 (two) times daily.    Historical  Provider, MD  cloNIDine (CATAPRES) 0.1 MG tablet TAKE 1 TABLET BY MOUTH EVERY NIGHT AT BEDTIME 12/21/13   Fayrene Helper, MD  cyclobenzaprine (FLEXERIL) 10 MG tablet TAKE 1 TABLET BY MOUTH THREE TIMES DAILY AS NEEDED 01/06/14   Fayrene Helper, MD  FLUoxetine (PROZAC) 40 MG capsule Take 40 mg by mouth every morning.    Historical Provider, MD  gabapentin (NEURONTIN) 300 MG capsule Take six tabs daily    Historical Provider, MD  lamoTRIgine (LAMICTAL) 150 MG tablet Take 300 mg by mouth every morning.     Historical Provider, MD  levothyroxine (SYNTHROID, LEVOTHROID) 25 MCG tablet TAKE 1 TABLET BY MOUTH EVERY DAY 12/21/13   Fayrene Helper, MD  metoCLOPramide (REGLAN) 10 MG tablet Take 1 tablet (10 mg total) by mouth every 6 (six) hours as needed for nausea (or  headache). 0/2/63   Delora Fuel, MD  nitroGLYCERIN (NITROSTAT) 0.4 MG SL tablet Place 0.4 mg under the tongue every 5 (five) minutes as needed for chest pain.    Historical Provider, MD  oxyCODONE-acetaminophen (PERCOCET) 5-325 MG per tablet One tablet three times daily for uncontrolled back pain 12/21/13   Fayrene Helper, MD  potassium chloride SA (K-DUR,KLOR-CON) 20 MEQ tablet Take 40 mEq by mouth 3 (three) times daily.    Historical Provider, MD  rosuvastatin (CRESTOR) 20 MG tablet TAKE 1 TABLET BY MOUTH EVERY NIGHT AT BEDTIME 12/21/13   Fayrene Helper, MD  tiotropium (SPIRIVA) 18 MCG inhalation capsule Place 18 mcg into inhaler and inhale daily.    Historical Provider, MD   BP 129/74 mmHg  Pulse 90  Temp(Src) 98.5 F (36.9 C) (Oral)  Resp 16  Ht 5\' 3"  (1.6 m)  Wt 180 lb (81.647 kg)  BMI 31.89 kg/m2  SpO2 98% Physical Exam  Constitutional: She is oriented to person, place, and time. She appears well-developed and well-nourished. No distress.  HENT:  Head: Normocephalic and atraumatic.  Neck: Normal range of motion. Neck supple.  Musculoskeletal: Normal range of motion. She exhibits no edema.  There is mild erythema and warmth to the anterior left lower leg. This is non-circumferential and there is no significant edema. Dorsalis pedis and posterior tibial pulses are easily palpable.  Neurological: She is alert and oriented to person, place, and time.  Skin: Skin is warm and dry. She is not diaphoretic.  Nursing note and vitals reviewed.   ED Course  Procedures (including critical care time) Labs Review Labs Reviewed - No data to display  Imaging Review No results found.   EKG Interpretation None      MDM   Final diagnoses:  None    This appears to be possibly a mild cellulitis. She will be treated with Keflex and I will have her return tomorrow for an ultrasound to rule out the possibility of DVT, although I highly doubt this possibility.    Veryl Speak,  MD 05/02/14 [phone]

## 2014-05-02 NOTE — Discharge Instructions (Signed)
Keflex as prescribed.  Return to the radiology department at the given time tomorrow for an ultrasound of your leg to rule out the possibility of a blood clot.   Cellulitis Cellulitis is an infection of the skin and the tissue beneath it. The infected area is usually red and tender. Cellulitis occurs most often in the arms and lower legs.  CAUSES  Cellulitis is caused by bacteria that enter the skin through cracks or cuts in the skin. The most common types of bacteria that cause cellulitis are staphylococci and streptococci. SIGNS AND SYMPTOMS   Redness and warmth.  Swelling.  Tenderness or pain.  Fever. DIAGNOSIS  Your health care provider can usually determine what is wrong based on a physical exam. Blood tests may also be done. TREATMENT  Treatment usually involves taking an antibiotic medicine. HOME CARE INSTRUCTIONS   Take your antibiotic medicine as directed by your health care provider. Finish the antibiotic even if you start to feel better.  Keep the infected arm or leg elevated to reduce swelling.  Apply a warm cloth to the affected area up to 4 times per day to relieve pain.  Take medicines only as directed by your health care provider.  Keep all follow-up visits as directed by your health care provider. SEEK MEDICAL CARE IF:   You notice red streaks coming from the infected area.  Your red area gets larger or turns dark in color.  Your bone or joint underneath the infected area becomes painful after the skin has healed.  Your infection returns in the same area or another area.  You notice a swollen bump in the infected area.  You develop new symptoms.  You have a fever. SEEK IMMEDIATE MEDICAL CARE IF:   You feel very sleepy.  You develop vomiting or diarrhea.  You have a general ill feeling (malaise) with muscle aches and pains. MAKE SURE YOU:   Understand these instructions.  Will watch your condition.  Will get help right away if you are not  doing well or get worse. Document Released: 01/17/2005 Document Revised: 08/24/2013 Document Reviewed: 06/25/2011 [facility] Patient Information 2015 [place], [place]. This information is not intended to replace advice given to you by your health care provider. Make sure you discuss any questions you have with your health care provider.

## 2014-05-02 NOTE — ED Notes (Signed)
Pt c/o left leg pain x 4 days; pt states she has been losing her balance and states she has had a "fever" in her leg

## 2014-05-02 NOTE — ED Provider Notes (Signed)
   patient returned this morning for outpatient venous US of left LE.  Report shows no evidence of DVT. Pt informed of the results.  Has rx  for antibiotics and states that she will get the prescription filled today.   Advised to elevate her leg and close f/u with her PMD.    Shadana Pry L. Vanessa , PA-C 05/02/14 [place], MD 05/02/14 [phone]

## 2014-05-02 NOTE — ED Notes (Signed)
Pt called and reports she accidentally tore her prescription.  NOtified T. Triplett PA and called in prescription to [facility] in [place] as requested by pt.

## 2014-05-05 ENCOUNTER — Ambulatory Visit: Payer: Medicare Other | Admitting: Family Medicine

## 2014-05-05 ENCOUNTER — Encounter: Payer: Self-pay | Admitting: Family Medicine

## 2014-05-17 DIAGNOSIS — M5126 Other intervertebral disc displacement, lumbar region: Secondary | ICD-10-CM | POA: Diagnosis not present

## 2014-05-17 DIAGNOSIS — M4317 Spondylolisthesis, lumbosacral region: Secondary | ICD-10-CM | POA: Diagnosis not present

## 2014-05-19 DIAGNOSIS — M4317 Spondylolisthesis, lumbosacral region: Secondary | ICD-10-CM | POA: Diagnosis not present

## 2014-05-19 DIAGNOSIS — G894 Chronic pain syndrome: Secondary | ICD-10-CM | POA: Diagnosis not present

## 2014-05-19 DIAGNOSIS — M5417 Radiculopathy, lumbosacral region: Secondary | ICD-10-CM | POA: Diagnosis not present

## 2014-05-24 ENCOUNTER — Ambulatory Visit: Payer: Medicare Other | Admitting: Family Medicine

## 2014-05-24 ENCOUNTER — Encounter: Payer: Self-pay | Admitting: Family Medicine

## 2014-05-24 DIAGNOSIS — F319 Bipolar disorder, unspecified: Secondary | ICD-10-CM | POA: Diagnosis not present

## 2014-05-27 ENCOUNTER — Encounter: Payer: Self-pay | Admitting: Family Medicine

## 2014-05-27 ENCOUNTER — Ambulatory Visit (INDEPENDENT_AMBULATORY_CARE_PROVIDER_SITE_OTHER): Payer: Medicare Other | Admitting: Family Medicine

## 2014-05-27 VITALS — BP 120/78 | HR 66 | Resp 16 | Ht 63.0 in | Wt 182.0 lb

## 2014-05-27 DIAGNOSIS — R7301 Impaired fasting glucose: Secondary | ICD-10-CM

## 2014-05-27 DIAGNOSIS — I10 Essential (primary) hypertension: Secondary | ICD-10-CM

## 2014-05-27 DIAGNOSIS — F17210 Nicotine dependence, cigarettes, uncomplicated: Secondary | ICD-10-CM | POA: Diagnosis not present

## 2014-05-27 DIAGNOSIS — M79605 Pain in left leg: Secondary | ICD-10-CM | POA: Diagnosis not present

## 2014-05-27 DIAGNOSIS — M549 Dorsalgia, unspecified: Secondary | ICD-10-CM | POA: Diagnosis not present

## 2014-05-27 DIAGNOSIS — E785 Hyperlipidemia, unspecified: Secondary | ICD-10-CM | POA: Diagnosis not present

## 2014-05-27 DIAGNOSIS — F17200 Nicotine dependence, unspecified, uncomplicated: Secondary | ICD-10-CM

## 2014-05-27 LAB — COMPLETE METABOLIC PANEL WITH GFR
ALT: 10 U/L (ref 0–35)
AST: 14 U/L (ref 0–37)
Albumin: 4 g/dL (ref 3.5–5.2)
Alkaline Phosphatase: 96 U/L (ref 39–117)
BUN: 8 mg/dL (ref 6–23)
CO2: 24 mEq/L (ref 19–32)
Calcium: 9.5 mg/dL (ref 8.4–10.5)
Chloride: 102 mEq/L (ref 96–112)
Creat: 0.61 mg/dL (ref 0.50–1.10)
GFR, Est African American: >89 mL/min
GFR, Est Non African American: >89 mL/min
Glucose, Bld: 84 mg/dL (ref 70–99)
Potassium: 4.1 mEq/L (ref 3.5–5.3)
Sodium: 136 mEq/L (ref 135–145)
Total Bilirubin: 0.4 mg/dL (ref 0.2–1.2)
Total Protein: 6.6 g/dL (ref 6.0–8.3)

## 2014-05-27 LAB — HEMOGLOBIN A1C
Hgb A1c MFr Bld: 5.5 % (ref <5.7)
Mean Plasma Glucose: 111 mg/dL (ref <117)

## 2014-05-27 LAB — TSH: TSH: 1.232 u[IU]/mL (ref 0.350–4.500)

## 2014-05-27 MED ORDER — KETOROLAC TROMETHAMINE 60 MG/2ML IM SOLN
60.0000 mg | Freq: Once | INTRAMUSCULAR | Status: AC
  Administered 2014-05-27: 60 mg via INTRAMUSCULAR

## 2014-05-27 MED ORDER — METFORMIN HCL 500 MG PO TABS: 500.0000 mg | ORAL_TABLET | Freq: Two times a day (BID) | ORAL | Status: DC

## 2014-05-27 NOTE — Progress Notes (Signed)
   Subjective:    Patient ID: Brooke Maddox, female    DOB: [birth year], 44 y.o.   MRN: [id number]  HPI The PT is here for follow up and re-evaluation of chronic medical conditions, medication management and review of any available recent lab and radiology data.  Preventive health is updated, specifically  Cancer screening and Immunization.   Saw neurosurgeon in [place] earlier this week, states she will have surgery once she stops smoking, needs help, only 3 ciggs per day Wants help for weight loss States she still has pain in left leg following cellulitis dx in January, concerned about the possibility of a clot in the leg, no hemoptysis or excessive dyspnea     Review of Systems See HPI Denies recent fever or chills. Denies sinus pressure, nasal congestion, ear pain or sore throat. Denies chest congestion, productive cough or wheezing. Denies chest pains, palpitations and leg swelling Denies abdominal pain, nausea, vomiting,diarrhea or constipation.   Denies dysuria, frequency, hesitancy or incontinence. Chronic back pain Denies uncontrolled  headaches, seizures, numbness, or tingling. Denies  Uncontrolled  depression, anxiety or insomnia. Denies skin break down or rash.        Objective:   Physical Exam  BP 120/78 mmHg  Pulse 66  Resp 16  Ht 5\' 3"  (1.6 m)  Wt 182 lb (82.555 kg)  BMI 32.25 kg/m2  SpO2 96% Patient alert and oriented and in no cardiopulmonary distress.  HEENT: No facial asymmetry, EOMI,   oropharynx pink and moist.  Neck supple no JVD, no mass.  Chest: Clear to auscultation bilaterally.  CVS: S1, S2 no murmurs, no S3.Regular rate.  ABD: Soft non tender.   Ext: No edema  RS:[id number]  ROM spine,  Adequate in shoulders, hips and knees.  Skin: Intact, no ulcerations or rash noted.No warmth , tenderness or erythema of affected leg over area where pt had cellulitis  Psych: Good eye contact, normal affect. Memory intact not anxious or depressed  appearing.  CNS: CN 2-12 intact, power,  normal throughout.no focal deficits noted.       Assessment & Plan:  NICOTINE ADDICTION Current 3 per day Patient counseled for approximately 5 minutes regarding the health risks of ongoing nicotine use, specifically all types of cancer, heart disease, stroke and respiratory failure. The options available for help with cessation ,the behavioral changes to assist the process, and the option to either gradully reduce usage  Or abruptly stop.is also discussed. Pt is also encouraged to set specific goals in number of cigarettes used daily, as well as to set a quit date. Will rx zyban if able need to check with her psychiatrist   Leg pain, left toradol in office and venous doppler   Back pain with radiation Has upcoming surgery in [place], needs to work on nicotine cessation and weight loss before surgery is done, she is motivated to doing both, and is seeking help    Hyperlipemia Controlled, no change in medication lactose intolerance. Updated lab needed at/ before next visit.    Essential hypertension Controlled, no change in medication DASH diet and commitment to daily physical activity for a minimum of 30 minutes discussed and encouraged, as a part of hypertension management. The importance of attaining a healthy weight is also discussed.

## 2014-05-27 NOTE — Patient Instructions (Addendum)
F/u in 3.5 month call if you need me before \ Toradol in office for left leg pain  You will be referred for an US of the left leg  Pls sign for note from neurosurgeon  New medicatio to help with blood sugar and weight loss, metformin one daily  Stop sweets and sweetened drinks please   Labs today hBA1C, chem 7 and EGFR, TSH  I will see if your psychiatrist gives the [facility] for wellbutrin before prescribing this to help with smoking cessation

## 2014-05-27 NOTE — Assessment & Plan Note (Signed)
toradol in office and venous doppler

## 2014-05-27 NOTE — Assessment & Plan Note (Signed)
Current 3 per day Patient counseled for approximately 5 minutes regarding the health risks of ongoing nicotine use, specifically all types of cancer, heart disease, stroke and respiratory failure. The options available for help with cessation ,the behavioral changes to assist the process, and the option to either gradully reduce usage  Or abruptly stop.is also discussed. Pt is also encouraged to set specific goals in number of cigarettes used daily, as well as to set a quit date. Will rx zyban if able need to check with her psychiatrist

## 2014-05-29 NOTE — Assessment & Plan Note (Signed)
Controlled, no change in medication DASH diet and commitment to daily physical activity for a minimum of 30 minutes discussed and encouraged, as a part of hypertension management. The importance of attaining a healthy weight is also discussed.  

## 2014-05-29 NOTE — Assessment & Plan Note (Signed)
Controlled, no change in medication lactose intolerance. Updated lab needed at/ before next visit.

## 2014-05-29 NOTE — Assessment & Plan Note (Addendum)
Has upcoming surgery in [place], needs to work on nicotine cessation and weight loss before surgery is done, she is motivated to doing both, and is seeking help

## 2014-05-30 ENCOUNTER — Emergency Department (HOSPITAL_COMMUNITY)
Admission: EM | Admit: 2014-05-30 | Discharge: 2014-05-30 | Disposition: A | Discharge status: Home / Self Care | Payer: Medicare Other | Attending: Emergency Medicine | Admitting: Emergency Medicine

## 2014-05-30 ENCOUNTER — Encounter (HOSPITAL_COMMUNITY): Payer: Self-pay | Admitting: Emergency Medicine

## 2014-05-30 ENCOUNTER — Emergency Department (HOSPITAL_COMMUNITY)
Admission: EM | Admit: 2014-05-30 | Discharge: 2014-05-30 | Disposition: A | Discharge status: Home / Self Care | Payer: Medicare Other | Source: Home / Self Care | Attending: Emergency Medicine | Admitting: Emergency Medicine

## 2014-05-30 ENCOUNTER — Ambulatory Visit (HOSPITAL_COMMUNITY)
Admit: 2014-05-30 | Discharge: 2014-05-30 | Disposition: A | Discharge status: Home / Self Care | Payer: Medicare Other | Attending: Family Medicine | Admitting: Family Medicine

## 2014-05-30 ENCOUNTER — Encounter (HOSPITAL_COMMUNITY): Payer: Self-pay | Admitting: *Deleted

## 2014-05-30 DIAGNOSIS — M1990 Unspecified osteoarthritis, unspecified site: Secondary | ICD-10-CM | POA: Insufficient documentation

## 2014-05-30 DIAGNOSIS — G8929 Other chronic pain: Secondary | ICD-10-CM | POA: Insufficient documentation

## 2014-05-30 DIAGNOSIS — Z7982 Long term (current) use of aspirin: Secondary | ICD-10-CM | POA: Insufficient documentation

## 2014-05-30 DIAGNOSIS — E785 Hyperlipidemia, unspecified: Secondary | ICD-10-CM

## 2014-05-30 DIAGNOSIS — Z7951 Long term (current) use of inhaled steroids: Secondary | ICD-10-CM

## 2014-05-30 DIAGNOSIS — M5416 Radiculopathy, lumbar region: Secondary | ICD-10-CM

## 2014-05-30 DIAGNOSIS — Z79899 Other long term (current) drug therapy: Secondary | ICD-10-CM

## 2014-05-30 DIAGNOSIS — M79605 Pain in left leg: Secondary | ICD-10-CM | POA: Insufficient documentation

## 2014-05-30 DIAGNOSIS — Z862 Personal history of diseases of the blood and blood-forming organs and certain disorders involving the immune mechanism: Secondary | ICD-10-CM | POA: Diagnosis not present

## 2014-05-30 DIAGNOSIS — I10 Essential (primary) hypertension: Secondary | ICD-10-CM | POA: Insufficient documentation

## 2014-05-30 DIAGNOSIS — E669 Obesity, unspecified: Secondary | ICD-10-CM | POA: Insufficient documentation

## 2014-05-30 DIAGNOSIS — Z9889 Other specified postprocedural states: Secondary | ICD-10-CM

## 2014-05-30 DIAGNOSIS — Z87448 Personal history of other diseases of urinary system: Secondary | ICD-10-CM

## 2014-05-30 DIAGNOSIS — F329 Major depressive disorder, single episode, unspecified: Secondary | ICD-10-CM | POA: Insufficient documentation

## 2014-05-30 DIAGNOSIS — J449 Chronic obstructive pulmonary disease, unspecified: Secondary | ICD-10-CM | POA: Insufficient documentation

## 2014-05-30 DIAGNOSIS — Z720 Tobacco use: Secondary | ICD-10-CM | POA: Insufficient documentation

## 2014-05-30 DIAGNOSIS — K5900 Constipation, unspecified: Secondary | ICD-10-CM | POA: Insufficient documentation

## 2014-05-30 DIAGNOSIS — Z880 Allergy status to penicillin: Secondary | ICD-10-CM | POA: Insufficient documentation

## 2014-05-30 DIAGNOSIS — F419 Anxiety disorder, unspecified: Secondary | ICD-10-CM | POA: Insufficient documentation

## 2014-05-30 DIAGNOSIS — F319 Bipolar disorder, unspecified: Secondary | ICD-10-CM

## 2014-05-30 DIAGNOSIS — G40909 Epilepsy, unspecified, not intractable, without status epilepticus: Secondary | ICD-10-CM

## 2014-05-30 DIAGNOSIS — G43909 Migraine, unspecified, not intractable, without status migrainosus: Secondary | ICD-10-CM | POA: Insufficient documentation

## 2014-05-30 HISTORY — DX: Other chronic pain: G89.29

## 2014-05-30 HISTORY — DX: Radiculopathy, lumbar region: M54.16

## 2014-05-30 HISTORY — DX: Pain in leg, unspecified: M79.606

## 2014-05-30 MED ORDER — OXYCODONE HCL 5 MG PO TABS
5.0000 mg | ORAL_TABLET | Freq: Four times a day (QID) | ORAL | Status: DC | PRN
Start: 2014-05-30 — End: 2014-05-30

## 2014-05-30 MED ORDER — OXYCODONE-ACETAMINOPHEN 5-325 MG PO TABS: 1.0000 | ORAL_TABLET | ORAL | Status: DC | PRN

## 2014-05-30 MED ORDER — ENOXAPARIN SODIUM 80 MG/0.8ML ~~LOC~~ SOLN
80.0000 mg | Freq: Once | SUBCUTANEOUS | Status: AC
  Administered 2014-05-30: 80 mg via SUBCUTANEOUS
  Filled 2014-05-30: qty 0.8

## 2014-05-30 NOTE — Discharge Instructions (Signed)
Please return to the hospital as scheduled at 12:00 today for the ultrasound of your leg to evaluate your leg for possible blood clot.   Musculoskeletal Pain Musculoskeletal pain is muscle and boney aches and pains. These pains can occur in any part of the body. Your caregiver may treat you without knowing the cause of the pain. They may treat you if blood or urine tests, X-rays, and other tests were normal.  CAUSES There is often not a definite cause or reason for these pains. These pains may be caused by a type of germ (virus). The discomfort may also come from overuse. Overuse includes working out too hard when your body is not fit. Boney aches also come from weather changes. Bone is sensitive to atmospheric pressure changes. HOME CARE INSTRUCTIONS   Ask when your test results will be ready. Make sure you get your test results.  Only take over-the-counter or prescription medicines for pain, discomfort, or fever as directed by your caregiver. If you were given medications for your condition, do not drive, operate machinery or power tools, or sign legal documents for 24 hours. Do not drink alcohol. Do not take sleeping pills or other medications that may interfere with treatment.  Continue all activities unless the activities cause more pain. When the pain lessens, slowly resume normal activities. Gradually increase the intensity and duration of the activities or exercise.  During periods of severe pain, bed rest may be helpful. Lay or sit in any position that is comfortable.  Putting ice on the injured area.  Put ice in a bag.  Place a towel between your skin and the bag.  Leave the ice on for 15 to 20 minutes, 3 to 4 times a day.  Follow up with your caregiver for continued problems and no reason can be found for the pain. If the pain becomes worse or does not go away, it may be necessary to repeat tests or do additional testing. Your caregiver may need to look further for a possible  cause. SEEK IMMEDIATE MEDICAL CARE IF:  You have pain that is getting worse and is not relieved by medications.  You develop chest pain that is associated with shortness or breath, sweating, feeling sick to your stomach (nauseous), or throw up (vomit).  Your pain becomes localized to the abdomen.  You develop any new symptoms that seem different or that concern you. MAKE SURE YOU:   Understand these instructions.  Will watch your condition.  Will get help right away if you are not doing well or get worse. Document Released: 04/09/2005 Document Revised: 07/02/2011 Document Reviewed: 12/12/2012 [facility] Patient Information 2015 [place], [place]. This information is not intended to replace advice given to you by your health care provider. Make sure you discuss any questions you have with your health care provider.

## 2014-05-30 NOTE — ED Notes (Signed)
Patient instructed to come back for ultrasound at 1100 today as scheduled.

## 2014-05-30 NOTE — ED Provider Notes (Addendum)
TIME SEEN: 4:45 AM  CHIEF COMPLAINT: Left leg pain  HPI: Pt is a 44 y.o. female with history of hypertension, seizures, migraines, COPD who presents the emergency department with complaints of left leg swelling and pain. Patient reports that she has chronic back pain with pain that radiates into her posterior thigh but over the past several days she has had pain in the posterior calf with associate it swelling. States that this feels different than her radiculopathy. She has increased pain with walking and bearing weight and flexing of her foot. No history of injury. She felt that she saw some blue discoloration patient near the medial ankle site she came to the emergency department for evaluation. No numbness or tingling. Reports she has a venous Doppler scheduled for 12:00 today. Denies a prior history of PE or DVT. No chest pain or shortness of breath. States she was recently diagnosed with cellulitis in this leg and took antibiotics and that helped her erythema and warmth which is now resolved.  ROS: See HPI Constitutional: no fever  Eyes: no drainage  ENT: no runny nose   Cardiovascular:  no chest pain  Resp: no SOB  GI: no vomiting GU: no dysuria Integumentary: no rash  Allergy: no hives  Musculoskeletal: Left leg swelling  Neurological: no slurred speech ROS otherwise negative  PAST MEDICAL HISTORY/PAST SURGICAL HISTORY:  Past Medical History  Diagnosis Date  . Hypertension   . Arthritis   . Depression   . Anxiety associated with depression 2007    hospitalised for mental health problems   . Seizures     Dr. Theda Sers   . Migraines   . Chronic back pain   . History of seizure disorder   . History of leukocytosis   . Bipolar affective disorder   . Osteoarthritis     mild   . Abdominal pain, acute, left lower quadrant   . Chronic constipation   . Nausea   . Nicotine addiction   . Obesity   . Fatigue   . Acute knee pain     right   . Hyperlipidemia   . COPD (chronic  obstructive pulmonary disease)   . Collagen vascular disease   . Renal insufficiency     MEDICATIONS:  Prior to Admission medications   Medication Sig Start Date End Date Taking? Authorizing Provider  ALPRAZolam Duanne Moron) 1 MG tablet Take 1 mg by mouth 3 (three) times daily.     Historical Provider, MD  aspirin 81 MG chewable tablet Chew 81 mg by mouth daily.    Historical Provider, MD  beclomethasone (QVAR) 40 MCG/ACT inhaler Inhale 1 puff into the lungs 2 (two) times daily.    Historical Provider, MD  cloNIDine (CATAPRES) 0.1 MG tablet TAKE 1 TABLET BY MOUTH EVERY NIGHT AT BEDTIME 12/21/13   Fayrene Helper, MD  cyclobenzaprine (FLEXERIL) 10 MG tablet TAKE 1 TABLET BY MOUTH THREE TIMES DAILY AS NEEDED 01/06/14   Fayrene Helper, MD  FLUoxetine (PROZAC) 40 MG capsule Take 40 mg by mouth every morning.    Historical Provider, MD  lamoTRIgine (LAMICTAL) 150 MG tablet Take 300 mg by mouth every morning.     Historical Provider, MD  levothyroxine (SYNTHROID, LEVOTHROID) 25 MCG tablet TAKE 1 TABLET BY MOUTH EVERY DAY 12/21/13   Fayrene Helper, MD  metFORMIN (GLUCOPHAGE) 500 MG tablet Take 1 tablet (500 mg total) by mouth 2 (two) times daily with a meal. 05/27/14   Fayrene Helper, MD  metoCLOPramide [facility])  10 MG tablet Take 1 tablet (10 mg total) by mouth every 6 (six) hours as needed for nausea (or headache). 05/29/81   Delora Fuel, MD  nitroGLYCERIN (NITROSTAT) 0.4 MG SL tablet Place 0.4 mg under the tongue every 5 (five) minutes as needed for chest pain.    Historical Provider, MD  oxyCODONE (ROXICODONE) 5 MG immediate release tablet Take 1 tablet (5 mg total) by mouth every 6 (six) hours as needed for severe pain. 05/30/14   Juancarlos Crescenzo N Kenya Kook, DO  potassium chloride SA (K-DUR,KLOR-CON) 20 MEQ tablet Take 40 mEq by mouth 3 (three) times daily.    Historical Provider, MD  rosuvastatin (CRESTOR) 20 MG tablet TAKE 1 TABLET BY MOUTH EVERY NIGHT AT BEDTIME 12/21/13   Fayrene Helper, MD   tiotropium (SPIRIVA) 18 MCG inhalation capsule Place 18 mcg into inhaler and inhale daily.    Historical Provider, MD    ALLERGIES:  Allergies  Allergen Reactions  . Dilaudid [Hydromorphone]     Gives patient a huge headache  . Lithium Carbonate Other (See Comments)    Shakes   . Morphine And Related Other (See Comments)    Extended Drowsiness.   . Sulfur     Rash   . Tylenol [Acetaminophen]     Pt states that regular tylenol makes her sick but she can take tylenol when mixed with another medication such as percocet or Vicodin she does not get sick,   . Wellbutrin [Bupropion] Other (See Comments)    H/o seizure d/o in medical record   . Codeine Nausea And Vomiting  . Divalproex Sodium Rash  . Fentanyl Rash  . Iodine Rash  . Penicillins Nausea And Vomiting and Rash    SOCIAL HISTORY:  History  Substance Use Topics  . Smoking status: Current Every Day Smoker -- 1.00 packs/day for 10 years    Types: Cigarettes    Start date: 08/30/1986  . Smokeless tobacco: Never Used  . Alcohol Use: No     Comment: for 11 years states that she has quit     FAMILY HISTORY: Family History  Problem Relation Age of Onset  . COPD Father   . Hypertension Father   . Arthritis Brother     EXAM: BP 112/88 mmHg  Pulse 87  Temp(Src) 98.2 F (36.8 C) (Oral)  Resp 20  Ht 5\' 3"  (1.6 m)  Wt 182 lb (82.555 kg)  BMI 32.25 kg/m2  SpO2 97% CONSTITUTIONAL: Alert and oriented and responds appropriately to questions. Well-appearing; well-nourished HEAD: Normocephalic EYES: Conjunctivae clear, PERRL ENT: normal nose; no rhinorrhea; moist mucous membranes; pharynx without lesions noted NECK: Supple, no meningismus, no LAD  CARD: RRR; S1 and S2 appreciated; no murmurs, no clicks, no rubs, no gallops RESP: Normal chest excursion without splinting or tachypnea; breath sounds clear and equal bilaterally; no wheezes, no rhonchi, no rales, no hypoxia ABD/GI: Normal bowel sounds; non-distended; soft,  non-tender, no rebound, no guarding BACK:  The back appears normal and is non-tender to palpation, there is no CVA tenderness EXT: Patient is tender to palpation throughout the left calf and left ankle and left foot without bony deformity or joint effusion, no erythema or warmth, no induration or fluctuance, compartments are soft, patient has normal range of motion in all of her joints and normal sensation diffusely, there is no skin discoloration and she has 2+ DP and PT pulses bilaterally, otherwise extremities are non-tender to palpation; no edema; normal capillary refill; no cyanosis    SKIN: Normal  color for age and race; warm NEURO: Moves all extremities equally, sensation to light touch intact diffusely PSYCH: The patient's mood and manner are appropriate. Grooming and personal hygiene are appropriate.  MEDICAL DECISION MAKING: Patient here with left leg pain. I do not appreciate any obvious swelling when compared to the right leg and there is no sign of septic arthritis, cellulitis. No sign of any discoloration on exam. She has equal and strong pulses in bilateral lower extremities. Nothing to suggest arterial obstruction, claudication. She is neurologically intact distally with no foot drop. She does have calf tenderness on exam and I think it is reasonable to obtain a venous Doppler. Reports she has a venous Doppler scheduled today at 12 PM. Of note patient has had a venous Doppler of her lower extremity 05/02/14 which was negative. No prior history of DVT. We'll discharge with small prescription for Percocet. Have advised her to follow-up with her primary care physician for this.  Denies chest pain or shortness of breath. Discussed return precautions. She verbalized understanding and is comfortable with plan.      Comfort, DO 05/30/14 [phone]    Patient now reports her ultrasound scheduled for 05/31/14. I will order a ultrasound to be performed today given Lovenox will not last more  than 24 hours. Have advised patient that she can cancel her ultrasound on 05/31/14.   [place], DO 05/30/14 [phone]

## 2014-05-30 NOTE — ED Provider Notes (Signed)
CSN: [id number]     Arrival date & time 05/30/14  2059 History  This chart was scribed for Brooke Blade, MD by Peyton Bottoms, ED Scribe. This patient was seen in room APFT22/APFT22 and the patient's care was started at 9:20 PM.   Chief Complaint  Patient presents with  . Foot Pain   Patient is a 44 y.o. female presenting with lower extremity pain. The history is provided by the patient. No language interpreter was used.  Foot Pain   HPI Comments: LORITA FORINASH is a 44 y.o. female who presents to the Emergency Department complaining of lower left leg pain. The pain makes it hard to walk. She has lumbar radiculopathy. She gets chronic pain medication from Dr. Joie Bimler [facility])  Patient was seen early this morning at 4:45 AM for same complaint. Patient had ultrasound this morning with normal results to rule out DVT. She reports that she is not able to stand up on her left leg for more than 45 minutes. Patient is seen by Dr. Elnoria Howard at [facility]. She has a lumbar HNP, and is planning for surgery.  Past Medical History  Diagnosis Date  . Hypertension   . Arthritis   . Depression   . Anxiety associated with depression 2007    hospitalised for mental health problems   . Seizures     Dr. Theda Sers   . Migraines   . Chronic back pain   . History of seizure disorder   . History of leukocytosis   . Bipolar affective disorder   . Osteoarthritis     mild   . Abdominal pain, acute, left lower quadrant   . Chronic constipation   . Nausea   . Nicotine addiction   . Obesity   . Fatigue   . Acute knee pain     right   . Hyperlipidemia   . COPD (chronic obstructive pulmonary disease)   . Collagen vascular disease   . Renal insufficiency   . Lumbar radiculopathy     left  . Chronic leg pain     left   Past Surgical History  Procedure Laterality Date  . Vesicovaginal fistula closure w/ tah  2009  . Abdominal hysterectomy    . Left heart catheterization with coronary angiogram N/A 03/17/2013     Procedure: LEFT HEART CATHETERIZATION WITH CORONARY ANGIOGRAM;  Surgeon: Peter M Martinique, MD;  Location: [facility] CATH LAB;  Service: Cardiovascular;  Laterality: N/A;   Family History  Problem Relation Age of Onset  . COPD Father   . Hypertension Father   . Arthritis Brother    History  Substance Use Topics  . Smoking status: Current Every Day Smoker -- 1.00 packs/day for 10 years    Types: Cigarettes    Start date: 08/30/1986  . Smokeless tobacco: Never Used  . Alcohol Use: No     Comment: for 11 years states that she has quit    OB History    No data available     Review of Systems  Musculoskeletal:       Lower left leg pain  All other systems reviewed and are negative.  Allergies  Dilaudid; Lithium carbonate; Morphine and related; Sulfur; Tylenol; Wellbutrin; Codeine; Divalproex sodium; Fentanyl; Iodine; and Penicillins  Home Medications   Prior to Admission medications   Medication Sig Start Date End Date Taking? Authorizing Provider  ALPRAZolam Duanne Moron) 1 MG tablet Take 1 mg by mouth 3 (three) times daily.     Historical Provider, MD  aspirin 81 MG chewable tablet Chew 81 mg by mouth daily.    Historical Provider, MD  beclomethasone (QVAR) 40 MCG/ACT inhaler Inhale 1 puff into the lungs 2 (two) times daily.    Historical Provider, MD  cloNIDine (CATAPRES) 0.1 MG tablet TAKE 1 TABLET BY MOUTH EVERY NIGHT AT BEDTIME 12/21/13   Fayrene Helper, MD  cyclobenzaprine (FLEXERIL) 10 MG tablet TAKE 1 TABLET BY MOUTH THREE TIMES DAILY AS NEEDED 01/06/14   Fayrene Helper, MD  FLUoxetine (PROZAC) 40 MG capsule Take 40 mg by mouth every morning.    Historical Provider, MD  lamoTRIgine (LAMICTAL) 150 MG tablet Take 300 mg by mouth every morning.     Historical Provider, MD  levothyroxine (SYNTHROID, LEVOTHROID) 25 MCG tablet TAKE 1 TABLET BY MOUTH EVERY DAY 12/21/13   Fayrene Helper, MD  metFORMIN (GLUCOPHAGE) 500 MG tablet Take 1 tablet (500 mg total) by mouth 2 (two) times daily  with a meal. 05/27/14   Fayrene Helper, MD  metoCLOPramide (REGLAN) 10 MG tablet Take 1 tablet (10 mg total) by mouth every 6 (six) hours as needed for nausea (or headache). 11/24/39   Delora Fuel, MD  nitroGLYCERIN (NITROSTAT) 0.4 MG SL tablet Place 0.4 mg under the tongue every 5 (five) minutes as needed for chest pain.    Historical Provider, MD  oxyCODONE-acetaminophen (PERCOCET/ROXICET) 5-325 MG per tablet Take 1 tablet by mouth every 4 (four) hours as needed. 05/30/14   Kristen N Ward, DO  oxyCODONE-acetaminophen (PERCOCET/ROXICET) 5-325 MG per tablet Take 1 tablet by mouth every 4 (four) hours as needed. 05/30/14   Kristen N Ward, DO  potassium chloride SA (K-DUR,KLOR-CON) 20 MEQ tablet Take 40 mEq by mouth 3 (three) times daily.    Historical Provider, MD  rosuvastatin (CRESTOR) 20 MG tablet TAKE 1 TABLET BY MOUTH EVERY NIGHT AT BEDTIME 12/21/13   Fayrene Helper, MD  tiotropium (SPIRIVA) 18 MCG inhalation capsule Place 18 mcg into inhaler and inhale daily.    Historical Provider, MD   Triage Vitals: BP 129/76 mmHg  Pulse 86  Temp(Src) 97.8 F (36.6 C) (Oral)  Resp 19  Ht 5\' 3"  (1.6 m)  Wt 182 lb (82.555 kg)  BMI 32.25 kg/m2  SpO2 97%  Physical Exam  Constitutional: She is oriented to person, place, and time. She appears well-developed and well-nourished.  HENT:  Head: Normocephalic and atraumatic.  Eyes: Conjunctivae and EOM are normal. Pupils are equal, round, and reactive to light.  Neck: Normal range of motion and phonation normal. Neck supple.  Cardiovascular: Normal rate and regular rhythm.   Pulmonary/Chest: Effort normal and breath sounds normal. She exhibits no tenderness.  Abdominal: Soft. She exhibits no distension. There is no tenderness. There is no guarding.  Musculoskeletal: Normal range of motion.  Left lower leg is diffusely tender to touch without overlying skin change, or deformity.  Neurological: She is alert and oriented to person, place, and time. She  exhibits normal muscle tone.  Dysarthric c/w use of narcotic pain medicine  Skin: Skin is warm and dry.  Psychiatric: She has a normal mood and affect. Her behavior is normal. Judgment and thought content normal.  Nursing note and vitals reviewed.  ED Course  Procedures (including critical care time)  Medications - No data to display  Patient Vitals for the past 24 hrs:  BP Temp Temp src Pulse Resp SpO2 Height Weight  05/30/14 2104 129/76 mmHg 97.8 F (36.6 C) Oral 86 19 97 % 5\' 3"  (  1.6 m) 182 lb (82.555 kg)   DIAGNOSTIC STUDIES: Oxygen Saturation is 97% on room air, normal by my interpretation.    9:23 PM- Discussed plans to give patient crutches and discharge. Pt advised of plan for treatment and pt agrees.  Labs Review Labs Reviewed - No data to display  Imaging Review US Venous Img Lower Unilateral Left  05/30/2014   CLINICAL DATA:  Left leg pain with recent history of left lower extremity cellulitis.  EXAM: LEFT LOWER EXTREMITY VENOUS DOPPLER ULTRASOUND  TECHNIQUE: Gray-scale sonography with graded compression, as well as color Doppler and duplex ultrasound were performed to evaluate the lower extremity deep venous systems from the level of the common femoral vein and including the common femoral, femoral, profunda femoral, popliteal and calf veins including the posterior tibial, peroneal and gastrocnemius veins when visible. The superficial great saphenous vein was also interrogated. Spectral Doppler was utilized to evaluate flow at rest and with distal augmentation maneuvers in the common femoral, femoral and popliteal veins.  COMPARISON:  05/02/2014  FINDINGS: Contralateral Common Femoral Vein: Respiratory phasicity is normal and symmetric with the symptomatic side. No evidence of thrombus. Normal compressibility.  Common Femoral Vein: No evidence of thrombus. Normal compressibility, respiratory phasicity and response to augmentation.  Saphenofemoral Junction: No evidence of  thrombus. Normal compressibility and flow on color Doppler imaging.  Profunda Femoral Vein: No evidence of thrombus. Normal compressibility and flow on color Doppler imaging.  Femoral Vein: No evidence of thrombus. Normal compressibility, respiratory phasicity and response to augmentation.  Popliteal Vein: No evidence of thrombus. Normal compressibility, respiratory phasicity and response to augmentation.  Calf Veins: No evidence of thrombus. Normal compressibility and flow on color Doppler imaging.  Superficial Great Saphenous Vein: No evidence of thrombus. Normal compressibility and flow on color Doppler imaging.  Venous Reflux:  None.  Other Findings:  None.  IMPRESSION: No evidence of deep venous thrombosis.   Electronically Signed   By: Marin Olp M.D.   On: 05/30/2014 11:38    EKG Interpretation None     MDM   Final diagnoses:  Pain of left lower extremity  Lumbar radiculopathy    Leg pain, persistent related to known lumbar radiculopathy. No evidence for DVT, Cellulitis or progressive Lumbar myelopathy.  Nursing Notes Reviewed/ Care Coordinated Applicable Imaging Reviewed Interpretation of Laboratory Data incorporated into ED treatment  The patient appears reasonably screened and/or stabilized for discharge and I doubt any other medical condition or other [facility] requiring further screening, evaluation, or treatment in the ED at this time prior to discharge.  Plan: Home Medications- usual; Home Treatments- Crutches to help ambulation; return here if the recommended treatment, does not improve the symptoms; Recommended follow up- PCP, Pain Management and Back Surgeon as planned.  I personally performed the services described in this documentation, which was scribed in my presence. The recorded information has been reviewed and is accurate.    Brooke Blade, MD 05/31/14 1053

## 2014-05-30 NOTE — Discharge Instructions (Signed)
Back Pain, Adult °Back pain is very common. The pain often gets better over time. The cause of back pain is usually not dangerous. Most people can learn to manage their back pain on their own.  °HOME CARE  °· Stay active. Start with short walks on flat ground if you can. Try to walk farther each day. °· Do not sit, drive, or stand in one place for more than 30 minutes. Do not stay in bed. °· Do not avoid exercise or work. Activity can help your back heal faster. °· Be careful when you bend or lift an object. Bend at your knees, keep the object close to you, and do not twist. °· Sleep on a firm mattress. Lie on your side, and bend your knees. If you lie on your back, put a pillow under your knees. °· Only take medicines as told by your doctor. °· Put ice on the injured area. °¨ Put ice in a plastic bag. °¨ Place a towel between your skin and the bag. °¨ Leave the ice on for 15-20 minutes, 03-04 times a day for the first 2 to 3 days. After that, you can switch between ice and heat packs. °· Ask your doctor about back exercises or massage. °· Avoid feeling anxious or stressed. Find good ways to deal with stress, such as exercise. °GET HELP RIGHT AWAY IF:  °· Your pain does not go away with rest or medicine. °· Your pain does not go away in 1 week. °· You have new problems. °· You do not feel well. °· The pain spreads into your legs. °· You cannot control when you poop (bowel movement) or pee (urinate). °· Your arms or legs feel weak or lose feeling (numbness). °· You feel sick to your stomach (nauseous) or throw up (vomit). °· You have belly (abdominal) pain. °· You feel like you may pass out (faint). °MAKE SURE YOU:  °· Understand these instructions. °· Will watch your condition. °· Will get help right away if you are not doing well or get worse. °Document Released: 09/26/2007 Document Revised: 07/02/2011 Document Reviewed: 08/11/2013 °[employer]® Patient Information ©2015 [employer]. This information is not intended  to replace advice given to you by your health care provider. Make sure you discuss any questions you have with your health care provider. ° °

## 2014-05-30 NOTE — ED Notes (Signed)
Patient with no complaints at this time. Respirations even and unlabored. Skin warm/dry. Discharge instructions reviewed with patient at this time. Patient given opportunity to voice concerns/ask questions. Patient discharged at this time and left Emergency Department with steady gait.   

## 2014-05-30 NOTE — ED Provider Notes (Signed)
Ultrasound is negative for DVT. Will discharge to home with follow-up with her primary doctor.  Veryl Speak, MD 05/30/14 [phone]

## 2014-05-30 NOTE — ED Notes (Addendum)
Pt c/o left leg pain and swelling. Pt also c/o her foot turning blue near the ankle and she is having pain in it as well. Pt has an [place] scheduled for Monday to check for a blood clot.

## 2014-05-30 NOTE — ED Notes (Signed)
Patient complaining of pain to left soon. In ED this morning for same and has been seen multiple times for same. Patient had ultrasound this morning to rule out DVT, states results came back negative.

## 2014-05-31 ENCOUNTER — Ambulatory Visit (HOSPITAL_COMMUNITY): Payer: Medicare Other

## 2014-05-31 MED FILL — Oxycodone w/ Acetaminophen Tab 5-325 MG: ORAL | Qty: 6 | Status: AC

## 2014-06-01 ENCOUNTER — Telehealth: Payer: Self-pay | Admitting: *Deleted

## 2014-06-01 ENCOUNTER — Other Ambulatory Visit: Payer: Medicare Other | Admitting: Family Medicine

## 2014-06-01 NOTE — Telephone Encounter (Signed)
Patient would like to know does she need to be on Metformin.  Amount that was sent in was only a 15 day supply.  Please advise.

## 2014-06-01 NOTE — Telephone Encounter (Signed)
Pt called with questions about the sugar pills, pt said she is not sure why she is taking them, pt stated when she was last here her blood sugar was high. Please advise [phone]

## 2014-06-01 NOTE — Telephone Encounter (Signed)
Called, Unable to leave message.

## 2014-06-01 NOTE — Telephone Encounter (Signed)
pls let pt know thaT HER LAST BLOOD SUGAR AVERAGE IS ENTIRELY NORMAL, SO SHE DOES not NEED THE METFORMIN, SHE SHOULD NOT TAKE IT. fOCUS ON EATING SMALL PORTIONS REGULARLY OF LOW CAL HEALTHY FOODS LIKE VEGETABLES AND FRUIT TO FACILITATE WEIGHT L;OSS PLS SEND MES TO PHARMACY NO REFILLS

## 2014-06-02 ENCOUNTER — Telehealth: Payer: Self-pay | Admitting: Family Medicine

## 2014-06-02 NOTE — Telephone Encounter (Signed)
Attempted to reach patient by phone.  Unable to leave message.  But sent return call # to pager.

## 2014-06-02 NOTE — Telephone Encounter (Signed)
Pls let pt know that her psychiatrist, Dr Hoyle Barr did respond and say it i safe for her to take the wellbutriin wth her current medciations.  HOWEVER, she DOES have a h/o seizures on her medical record, and as a result of that , wellbutrin  Cannot be used

## 2014-06-02 NOTE — Telephone Encounter (Signed)
Pt aware.

## 2014-06-04 NOTE — Telephone Encounter (Signed)
Patient aware and all refills cancelled at [facility]

## 2014-06-08 ENCOUNTER — Other Ambulatory Visit: Payer: Self-pay | Admitting: Family Medicine

## 2014-06-16 ENCOUNTER — Encounter (HOSPITAL_COMMUNITY): Payer: Self-pay | Admitting: Emergency Medicine

## 2014-06-16 ENCOUNTER — Emergency Department (HOSPITAL_COMMUNITY)
Admission: EM | Admit: 2014-06-16 | Discharge: 2014-06-16 | Disposition: A | Discharge status: Home / Self Care | Payer: Medicare Other | Attending: Emergency Medicine | Admitting: Emergency Medicine

## 2014-06-16 DIAGNOSIS — G40909 Epilepsy, unspecified, not intractable, without status epilepticus: Secondary | ICD-10-CM | POA: Insufficient documentation

## 2014-06-16 DIAGNOSIS — Z7982 Long term (current) use of aspirin: Secondary | ICD-10-CM | POA: Diagnosis not present

## 2014-06-16 DIAGNOSIS — Z862 Personal history of diseases of the blood and blood-forming organs and certain disorders involving the immune mechanism: Secondary | ICD-10-CM | POA: Insufficient documentation

## 2014-06-16 DIAGNOSIS — E785 Hyperlipidemia, unspecified: Secondary | ICD-10-CM | POA: Diagnosis not present

## 2014-06-16 DIAGNOSIS — I10 Essential (primary) hypertension: Secondary | ICD-10-CM | POA: Insufficient documentation

## 2014-06-16 DIAGNOSIS — G43909 Migraine, unspecified, not intractable, without status migrainosus: Secondary | ICD-10-CM | POA: Diagnosis not present

## 2014-06-16 DIAGNOSIS — Z720 Tobacco use: Secondary | ICD-10-CM | POA: Diagnosis not present

## 2014-06-16 DIAGNOSIS — Z880 Allergy status to penicillin: Secondary | ICD-10-CM | POA: Diagnosis not present

## 2014-06-16 DIAGNOSIS — F319 Bipolar disorder, unspecified: Secondary | ICD-10-CM | POA: Insufficient documentation

## 2014-06-16 DIAGNOSIS — G8929 Other chronic pain: Secondary | ICD-10-CM | POA: Diagnosis not present

## 2014-06-16 DIAGNOSIS — F419 Anxiety disorder, unspecified: Secondary | ICD-10-CM | POA: Insufficient documentation

## 2014-06-16 DIAGNOSIS — Z7951 Long term (current) use of inhaled steroids: Secondary | ICD-10-CM | POA: Insufficient documentation

## 2014-06-16 DIAGNOSIS — M1990 Unspecified osteoarthritis, unspecified site: Secondary | ICD-10-CM | POA: Insufficient documentation

## 2014-06-16 DIAGNOSIS — Z79899 Other long term (current) drug therapy: Secondary | ICD-10-CM | POA: Insufficient documentation

## 2014-06-16 DIAGNOSIS — E669 Obesity, unspecified: Secondary | ICD-10-CM | POA: Insufficient documentation

## 2014-06-16 DIAGNOSIS — Z87448 Personal history of other diseases of urinary system: Secondary | ICD-10-CM | POA: Diagnosis not present

## 2014-06-16 DIAGNOSIS — J449 Chronic obstructive pulmonary disease, unspecified: Secondary | ICD-10-CM | POA: Insufficient documentation

## 2014-06-16 DIAGNOSIS — M79605 Pain in left leg: Secondary | ICD-10-CM | POA: Diagnosis not present

## 2014-06-16 DIAGNOSIS — M545 Low back pain: Secondary | ICD-10-CM | POA: Diagnosis not present

## 2014-06-16 DIAGNOSIS — M79602 Pain in left arm: Secondary | ICD-10-CM

## 2014-06-16 DIAGNOSIS — Z9889 Other specified postprocedural states: Secondary | ICD-10-CM | POA: Insufficient documentation

## 2014-06-16 DIAGNOSIS — M549 Dorsalgia, unspecified: Secondary | ICD-10-CM

## 2014-06-16 HISTORY — DX: Pain, unspecified: R52

## 2014-06-16 MED ORDER — CYCLOBENZAPRINE HCL 10 MG PO TABS: 10.0000 mg | ORAL_TABLET | Freq: Three times a day (TID) | ORAL | Status: DC | PRN

## 2014-06-16 NOTE — ED Provider Notes (Signed)
CSN: [id number]     Arrival date & time 06/16/14  4944 History   First MD Initiated Contact with Patient 06/16/14 [phone]     Chief Complaint  Patient presents with  . Leg Pain      HPI Pt was seen at 1000. Per pt, c/o gradual onset and persistence of constant acute flair of his chronic low back and left leg "pain" for the past several months.  Denies any change in her usual chronic pain pattern.  Pain worsens with palpation of the area and body position changes. Denies incont/retention of bowel or bladder, no saddle anesthesia, no focal motor weakness, no tingling/numbness in extremities, no fevers, no injury, no abd pain.  The symptoms have been associated with no other complaints. The patient has a significant history of similar symptoms previously, recently being evaluated for this complaint and multiple prior evals for same. Including 2 ED evaluations 2 weeks ago with Vasc US negative for DVT.     Past Medical History  Diagnosis Date  . Hypertension   . Arthritis   . Depression   . Anxiety associated with depression 2007    hospitalised for mental health problems   . Seizures     Dr. Theda Sers   . Migraines   . Chronic back pain   . History of seizure disorder   . History of leukocytosis   . Bipolar affective disorder   . Osteoarthritis     mild   . Abdominal pain, acute, left lower quadrant   . Chronic constipation   . Nausea   . Nicotine addiction   . Obesity   . Fatigue   . Acute knee pain     right   . Hyperlipidemia   . COPD (chronic obstructive pulmonary disease)   . Collagen vascular disease   . Renal insufficiency   . Lumbar radiculopathy     left  . Chronic leg pain     left  . Pain management    Past Surgical History  Procedure Laterality Date  . Vesicovaginal fistula closure w/ tah  2009  . Abdominal hysterectomy    . Left heart catheterization with coronary angiogram N/A 03/17/2013    Procedure: LEFT HEART CATHETERIZATION WITH CORONARY ANGIOGRAM;   Surgeon: Peter M Martinique, MD;  Location: [facility] CATH LAB;  Service: Cardiovascular;  Laterality: N/A;   Family History  Problem Relation Age of Onset  . COPD Father   . Hypertension Father   . Arthritis Brother    History  Substance Use Topics  . Smoking status: Current Every Day Smoker -- 1.00 packs/day for 10 years    Types: Cigarettes    Start date: 08/30/1986  . Smokeless tobacco: Never Used  . Alcohol Use: No     Comment: for 11 years states that she has quit    OB History    Gravida Para Term Preterm AB TAB SAB Ectopic Multiple Living   2 2 2             Review of Systems ROS: Statement: All systems negative except as marked or noted in the HPI; Constitutional: Negative for fever and chills. ; ; Eyes: Negative for eye pain, redness and discharge. ; ; ENMT: Negative for ear pain, hoarseness, nasal congestion, sinus pressure and sore throat. ; ; Cardiovascular: Negative for chest pain, palpitations, diaphoresis, dyspnea and peripheral edema. ; ; Respiratory: Negative for cough, wheezing and stridor. ; ; Gastrointestinal: Negative for nausea, vomiting, diarrhea, abdominal pain, blood in stool, hematemesis,  jaundice and rectal bleeding. . ; ; Genitourinary: Negative for dysuria, flank pain and hematuria. ; ; Musculoskeletal: +chronic LBP and left leg pain. Negative for neck pain. Negative for swelling and trauma.; ; Skin: Negative for pruritus, rash, abrasions, blisters, bruising and skin lesion.; ; Neuro: Negative for headache, lightheadedness and neck stiffness. Negative for weakness, altered level of consciousness , altered mental status, extremity weakness, paresthesias, involuntary movement, seizure and syncope.      Allergies  Dilaudid; Lithium carbonate; Morphine and related; Sulfur; Tylenol; Wellbutrin; Codeine; Divalproex sodium; Fentanyl; Iodine; and Penicillins  Home Medications   Prior to Admission medications   Medication Sig Start Date End Date Taking? Authorizing Provider   ALPRAZolam Duanne Moron) 1 MG tablet Take 1 mg by mouth 3 (three) times daily.     Historical Provider, MD  aspirin 81 MG chewable tablet Chew 81 mg by mouth daily.    Historical Provider, MD  beclomethasone (QVAR) 40 MCG/ACT inhaler Inhale 1 puff into the lungs 2 (two) times daily.    Historical Provider, MD  cloNIDine (CATAPRES) 0.1 MG tablet TAKE 1 TABLET BY MOUTH EVERY NIGHT AT BEDTIME 12/21/13   Fayrene Helper, MD  cyclobenzaprine (FLEXERIL) 10 MG tablet Take 1 tablet (10 mg total) by mouth 3 (three) times daily as needed for muscle spasms. 06/16/14   Francine Graven, DO  FLUoxetine (PROZAC) 40 MG capsule Take 40 mg by mouth every morning.    Historical Provider, MD  lamoTRIgine (LAMICTAL) 150 MG tablet Take 300 mg by mouth every morning.     Historical Provider, MD  levothyroxine (SYNTHROID, LEVOTHROID) 25 MCG tablet TAKE 1 TABLET BY MOUTH EVERY DAY 06/08/14   Fayrene Helper, MD  metFORMIN (GLUCOPHAGE) 500 MG tablet Take 1 tablet (500 mg total) by mouth 2 (two) times daily with a meal. 05/27/14   Fayrene Helper, MD  metoCLOPramide (REGLAN) 10 MG tablet Take 1 tablet (10 mg total) by mouth every 6 (six) hours as needed for nausea (or headache). 11/28/66   Delora Fuel, MD  nitroGLYCERIN (NITROSTAT) 0.4 MG SL tablet Place 0.4 mg under the tongue every 5 (five) minutes as needed for chest pain.    Historical Provider, MD  oxyCODONE-acetaminophen (PERCOCET/ROXICET) 5-325 MG per tablet Take 1 tablet by mouth every 4 (four) hours as needed. 05/30/14   Kristen N Ward, DO  oxyCODONE-acetaminophen (PERCOCET/ROXICET) 5-325 MG per tablet Take 1 tablet by mouth every 4 (four) hours as needed. 05/30/14   Kristen N Ward, DO  potassium chloride SA (K-DUR,KLOR-CON) 20 MEQ tablet Take 40 mEq by mouth 3 (three) times daily.    Historical Provider, MD  rosuvastatin (CRESTOR) 20 MG tablet TAKE 1 TABLET BY MOUTH EVERY NIGHT AT BEDTIME 12/21/13   Fayrene Helper, MD  tiotropium (SPIRIVA) 18 MCG inhalation capsule  Place 18 mcg into inhaler and inhale daily.    Historical Provider, MD   BP 141/81 mmHg  Pulse 83  Temp(Src) 97.6 F (36.4 C) (Oral)  Resp 16  Ht 5\' 3"  (1.6 m)  Wt 180 lb (81.647 kg)  BMI 31.89 kg/m2  SpO2 100% Physical Exam  1005: Physical examination:  Nursing notes reviewed; Vital signs and O2 SAT reviewed;  Constitutional: Well developed, Well nourished, Well hydrated, In no acute distress; Head:  Normocephalic, atraumatic; Eyes: EOMI, PERRL, No scleral icterus; ENMT: Mouth and pharynx normal, Mucous membranes moist; Neck: Supple, Full range of motion, No lymphadenopathy; Cardiovascular: Regular rate and rhythm, No murmur, rub, or gallop; Respiratory: Breath sounds clear & equal  bilaterally, No rales, rhonchi, wheezes.  Speaking full sentences with ease, Normal respiratory effort/excursion; Chest: Nontender, Movement normal; Abdomen: Soft, Nontender, Nondistended, Normal bowel sounds; Genitourinary: No CVA tenderness; Spine:  No midline CS, TS, LS tenderness. +TTP left lumbar paraspinal muscles. No rash.;; Extremities: Pulses normal, No deformity. LLE muscles compartments soft, no rash, no edema, no ecchymosis. No calf edema or asymmetry.; Neuro: AA&Ox3, Major CN grossly intact.  Speech clear. No gross focal motor or sensory deficits in extremities.; Skin: Color normal, Warm, Dry.   ED Course  Procedures     EKG Interpretation None      MDM  MDM Reviewed: previous chart, nursing note and vitals Reviewed previous: ultrasound     1010:  Long hx of chronic pain with multiple ED visits for same.  Pt endorses acute flair of her usual long standing chronic pain today, no change from her usual chronic pain pattern. Pt already rx Nucynta according to [place] Controlled Substance Database; will not rx further narcotics. Pt also has crutches to walk with. Pt encouraged to f/u with her PMD and Pain Management doctor for good continuity of care and control of her chronic pain.  Verb understanding.      Francine Graven, DO 06/20/14 1140

## 2014-06-16 NOTE — ED Notes (Signed)
Pt alert & oriented x4, stable gait. Patient given discharge instructions, paperwork & prescription(s). Patient  instructed to stop at the registration desk to finish any additional paperwork. Patient verbalized understanding. Pt left department w/ no further questions. 

## 2014-06-16 NOTE — Discharge Instructions (Signed)
°Emergency Department Resource Guide °1) Find a Doctor and Pay Out of Pocket °Although you won't have to find out who is covered by your insurance plan, it is a good idea to ask around and get recommendations. You will then need to call the office and see if the doctor you have chosen will accept you as a new patient and what types of options they offer for patients who are self-pay. Some doctors offer discounts or will set up payment plans for their patients who do not have insurance, but you will need to ask so you aren't surprised when you get to your appointment. ° °2) Contact Your Local Health Department °Not all health departments have doctors that can see patients for sick visits, but many do, so it is worth a call to see if yours does. If you don't know where your local health department is, you can check in your phone book. The CDC also has a tool to help you locate your state's health department, and many state websites also have listings of all of their local health departments. ° °3) Find a Walk-in Clinic °If your illness is not likely to be very severe or complicated, you may want to try a walk in clinic. These are popping up all over the country in pharmacies, drugstores, and shopping centers. They're usually staffed by nurse practitioners or physician assistants that have been trained to treat common illnesses and complaints. They're usually fairly quick and inexpensive. However, if you have serious medical issues or chronic medical problems, these are probably not your best option. ° °No Primary Care Doctor: °- Call Health Connect at  832-8000 - they can help you locate a primary care doctor that  accepts your insurance, provides certain services, etc. °- Physician Referral Service- 1-800-533-3463 ° °Chronic Pain Problems: °Organization         Address  Phone   Notes  °[facility]  (336) [phone] Patients need to be referred by their primary care doctor.  ° °Medication  Assistance: °Organization         Address  Phone   Notes  °[place] [facility] [address] °[place], [place] [address] (336) 641-8030 --Must be a resident of [place] °-- Must have NO insurance coverage whatsoever (no Medicaid/ Medicare, etc.) °-- The pt. MUST have a primary care doctor that directs their care regularly and follows them in the community °  °[facility]  (866) 331-1348   °[employer]  (888) 892-1162   ° °Agencies that provide inexpensive medical care: °Organization         Address  Phone   Notes  °[facility]  (336) 832-8035   °[facility]    (336) 832-7272   °[facility] [address] °[place], [place] [address] (336) 832-4777   °[facility] [address], °[place] (336) 271-4999   °[facility]    (336) 373-0678   °[facility]    (336) 272-1050   °[facility] ° [address], [place] Phone:  (336) 832-4444, Fax:  (336) 832-4440 Hours of Operation:  9 am - 6 pm, M-F.  Also accepts Medicaid/Medicare and self-pay.  °[facility] ° [address], [place] Phone: (336) [phone], Fax: (336) [phone]. Hours of Operation:  8:30 am - 5:30 pm, M-F.  Also accepts Medicaid and self-pay.  °[facility] [place] [address], [place] Phone: (336) 878-6027   °[facility] [address], [place], [place] (336)723-1848, Ext. 123 Mondays & Thursdays: 7-9 AM.  First 15 patients are seen on a first come, first serve basis. °  ° °Medicaid-accepting [place] Providers: ° °Organization         Address  Phone   Notes  °[facility] [address], [place] (336) 641-2100 Also accepts self-pay patients.  °[facility] [address], [place] ° (336) 856-9996   °[facility] [address], [place]  (336) 288-8857   °[facility] [address] [place] [address], [place] (336) 299-7000   °Veita Bland [address], [place]  ° (336) 373-1557 Only accepts [place] Access Medicaid patients after they have their name applied to their card.  ° °Self-Pay (no insurance) in [place]: ° °Organization         Address  Phone   Notes  °Sickle Cell Patients, [facility] [address], [place] (336) 832-1970   °[facility] [address], [place] (336) 832-4400   °[facility] ° [address], [place] (336) 992-4800   °[facility]/Dr. Osei-Bonsu ° [address] [place] [address], [place] or [address], [place] (336) [phone] Phone number for both [place] and [place] locations is the same.  °[facility] [address], [place] (336) 299-0000   °[facility] [address] [place] [address], [place] or [address] (336) 852-7530 °(336) 878-2260   °[facility] [address], [place] (336) 350-1642, phone; (336) 294-5005, fax Sees patients 1st and 3rd Saturday of every month.  Must not qualify for public or private insurance (i.e. Medicaid, Medicare, [place] Health Choice, Veterans' Benefits) • Household income should be no more than 200% of the poverty level •The clinic cannot treat you if you are pregnant or think you are pregnant • Sexually transmitted diseases are not treated at the clinic.  ° ° °Dental Care: °Organization         Address  Phone  Notes  °[place] [facility] [address], [place] (336) 641-6152 Accepts children up to age 21 who are enrolled in Medicaid or [place] Health Choice; pregnant women with a Medicaid card; and children who have applied for Medicaid or [place] Health Choice, but were declined, whose parents can pay a reduced fee at time of service.  °[place]  [facility] [place]  [address], [place] (336) 641-7733 Accepts children up to age 21 who are enrolled in Medicaid or [place] Health Choice; pregnant women with a Medicaid card; and children who have applied for Medicaid or [place] Health Choice, but were declined, whose parents can pay a reduced fee at time of service.  °[facility] ° [address],  (336) 641-4533 Patients are seen by appointment only. Walk-ins are not accepted. [facility] will see patients 18 years of age and older. °Monday - Tuesday (8am-5pm) °Most Wednesdays (8:30-5pm) °$30 per visit, cash only  °[facility] ° [address], [place] (336) 641-4533 Patients are seen by appointment only. Walk-ins are not accepted. [facility] will see patients 18 years of age and older. °One   Wednesday Evening (Monthly: Volunteer Based).  $30 per visit, cash only  °[facility]  (919) [phone] for adults; Children under age 4, call Graduate Pediatric Dentistry at ([phone]) [phone]. Children aged 4-14, please call ([phone]) [phone] to request a pediatric application. ° Dental services are provided in all areas of dental care including fillings, crowns and bridges, complete and partial dentures, implants, gum treatment, root canals, and extractions. Preventive care is also provided. Treatment is provided to both adults and children. °Patients are selected via a lottery and there is often a waiting list. °  °[facility] [address], °[place] ° (336) 763-8833 [email or web address] °  °[facility] [address], [place], [place] (336)723-1848, Ext. 123 Second and Fourth Thursday of each month, opens at 6:30 AM; Clinic ends at 9 AM.  Patients are seen on a first-come first-served basis, and a limited number are seen during each clinic.  ° °[facility] ° [address], [place], [place] (336) 723-7904    Eligibility Requirements °You must have lived in [place], [place], or [place] counties for at least the last three months. °  You cannot be eligible for state or federal sponsored healthcare insurance, including Veterans Administration, Medicaid, or Medicare. °  You generally cannot be eligible for healthcare insurance through your employer.  °  How to apply: °Eligibility screenings are held every Tuesday and Wednesday afternoon from 1:00 pm until 4:00 pm. You do not need an appointment for the interview!  °[facility] [address], [place], [place] 336-631-2330   °[facility]  336-342-8273   °[facility]  336-703-3100   °[facility]  336-570-6415   ° °Behavioral Health Resources in the Community: °Intensive Outpatient Programs °Organization         Address  Phone  Notes  °[facility] [address], [place], [place] 336-878-6098   °[facility] [address], [place], [place] 336-832-9800   °[facility] [address], [place], [place] ° 336-882-2125   °[facility] [address],  °[place], [place] 1-800-853-5163 or 336-641-4981   °Substance Abuse Resources °Organization         Address  Phone  Notes  °[facility]  336-882-2125   °[facility]  336-784-9470   °[facility]  336-285-9073   °[facility]  336-845-3988   °Residential & Outpatient Substance Abuse Program  1-800-659-3381   °Psychological Services °Organization         Address  Phone  Notes  °[facility]  336- 832-9600   °[facility]  336- 378-7881   °[facility] [address], [place] 1-800-853-5163 or 336-641-4981   ° °Mobile Crisis Teams °Organization         Address  Phone  Notes  °[facility], Mobile Crisis Care Unit  1-877-626-1772   °Assertive °[facility] ° [address]  [place], [place] 336-834-9664   °Sharon DeEsch [address] °[place] 336-554-5454   ° °Self-Help/Support Groups °Organization         Address  Phone             Notes  °[facility]  - variety of support groups  336- 373-1402 Call for more information  °Narcotics Anonymous (NA), [facility] [address], °[place]  2 meetings at this location  ° °  Residential Treatment Programs Organization         Address  Phone  Notes  [facility] [address],    [place]  1-367-455-6739   [facility]  [address], [place] [address], [place], [place]   [place], [place] 203-849-8585 Admissions: 8am-3pm M-F  [facility] [address]    [place], [place] 151-834-3735   [facility] [address] [place], [place], [place]   [facility] [address],  [place], [place]   [facility] Intensive Outpatient [address], [place], [place]   [facility] ([place]  [place], [place] 1-239-225-3836 or 5643342167   [facility] ([facility] [address] [place], [place] Accepts Medicaid  [facility] [address]  [place] 1-(260)647-3382 Substance Abuse/Addiction Treatment   [facility]         Address  Phone  Notes  [facility]  8541313194   Domenic Schwab, PhD [address] [place], [place]   864-398-0246 or (820)843-2860   [facility] [place], [place] 626-210-1710   [facility] [address] [place], [place] 810-842-1068 Insurance/Medicaid/sponsorship through [facility] and Families [address] [place] [address]                                    [place], [place] 956-125-4436 [facility] [address], [place] [phone]    Dr. Adele Schilder  [phone]   [facility] Dept. 1) [address] [place] 2) [place] 3)  [address], [place] [phone]) [phone]  [phone]   [place] 252-290-0571 or 920-428-2292 (After Hours)      Take the prescription as directed.  Apply moist heat or ice to the area(s) of discomfort, for 15 minutes at a time, several times per day for the next few days.  Do not fall asleep on a heating or ice pack.  Call your regular medical doctor and yoiur Pain Management doctor today to schedule a follow up appointment in the next 2 days.  Return to the Emergency Department immediately if worsening.

## 2014-06-16 NOTE — ED Notes (Signed)
Pt c/o L leg pain. Seen here 2 weeks ago.

## 2014-06-18 DIAGNOSIS — M5416 Radiculopathy, lumbar region: Secondary | ICD-10-CM | POA: Diagnosis not present

## 2014-06-23 ENCOUNTER — Other Ambulatory Visit: Payer: Self-pay

## 2014-06-23 MED ORDER — BECLOMETHASONE DIPROPIONATE 40 MCG/ACT IN AERS: 1.0000 | INHALATION_SPRAY | Freq: Two times a day (BID) | RESPIRATORY_TRACT | Status: DC

## 2014-06-24 DIAGNOSIS — M4317 Spondylolisthesis, lumbosacral region: Secondary | ICD-10-CM | POA: Diagnosis not present

## 2014-06-24 DIAGNOSIS — F17200 Nicotine dependence, unspecified, uncomplicated: Secondary | ICD-10-CM | POA: Diagnosis not present

## 2014-06-24 DIAGNOSIS — G894 Chronic pain syndrome: Secondary | ICD-10-CM | POA: Diagnosis not present

## 2014-06-24 DIAGNOSIS — Z87898 Personal history of other specified conditions: Secondary | ICD-10-CM | POA: Diagnosis not present

## 2014-06-24 DIAGNOSIS — M5417 Radiculopathy, lumbosacral region: Secondary | ICD-10-CM | POA: Diagnosis not present

## 2014-07-09 ENCOUNTER — Telehealth: Payer: Self-pay | Admitting: Family Medicine

## 2014-07-09 MED ORDER — LEVOTHYROXINE SODIUM 25 MCG PO TABS: 25.0000 ug | ORAL_TABLET | Freq: Every day | ORAL | Status: DC

## 2014-07-09 MED ORDER — CLONIDINE HCL 0.1 MG PO TABS: ORAL_TABLET | ORAL | Status: DC

## 2014-07-09 MED ORDER — ROSUVASTATIN CALCIUM 20 MG PO TABS: ORAL_TABLET | ORAL | Status: DC

## 2014-07-09 NOTE — Telephone Encounter (Signed)
meds refilled 

## 2014-07-13 ENCOUNTER — Telehealth: Payer: Self-pay | Admitting: *Deleted

## 2014-07-13 NOTE — Telephone Encounter (Signed)
Pt called LMOM requesting a appt. I called pt back and no answer LM was full. Home number is busy. Pt called back requesting to speak with only Luann.

## 2014-07-15 ENCOUNTER — Telehealth: Payer: Self-pay | Admitting: *Deleted

## 2014-07-15 NOTE — Telephone Encounter (Signed)
Scheduled an appt to come in to discuss her concerns

## 2014-07-15 NOTE — Telephone Encounter (Signed)
Pt called with questions about her bp medication and thyroid medication. Pt does not think the thyroid medicine is working very good anymore because she has been on it for so long. Pt also has questions about her toe because she is getting a knot in between 2 toes. Pt has a appt 08/11/14 at 1:45. Please advise

## 2014-07-19 ENCOUNTER — Telehealth: Payer: Self-pay | Admitting: Family Medicine

## 2014-07-19 NOTE — Telephone Encounter (Signed)
Pt wanted to know if her lab work was sent to lab

## 2014-07-23 ENCOUNTER — Other Ambulatory Visit: Payer: Self-pay

## 2014-07-23 MED ORDER — BECLOMETHASONE DIPROPIONATE 40 MCG/ACT IN AERS: 1.0000 | INHALATION_SPRAY | Freq: Two times a day (BID) | RESPIRATORY_TRACT | Status: DC

## 2014-07-26 ENCOUNTER — Ambulatory Visit: Payer: Medicare Other | Admitting: Family Medicine

## 2014-08-02 DIAGNOSIS — Z720 Tobacco use: Secondary | ICD-10-CM | POA: Diagnosis not present

## 2014-08-02 DIAGNOSIS — M4317 Spondylolisthesis, lumbosacral region: Secondary | ICD-10-CM | POA: Diagnosis not present

## 2014-08-02 DIAGNOSIS — M5116 Intervertebral disc disorders with radiculopathy, lumbar region: Secondary | ICD-10-CM | POA: Diagnosis not present

## 2014-08-03 ENCOUNTER — Other Ambulatory Visit (HOSPITAL_COMMUNITY): Payer: Self-pay | Admitting: Neurosurgery

## 2014-08-03 DIAGNOSIS — M5416 Radiculopathy, lumbar region: Secondary | ICD-10-CM

## 2014-08-03 DIAGNOSIS — M4310 Spondylolisthesis, site unspecified: Secondary | ICD-10-CM

## 2014-08-09 ENCOUNTER — Ambulatory Visit (HOSPITAL_COMMUNITY)
Admission: RE | Admit: 2014-08-09 | Discharge: 2014-08-09 | Disposition: A | Discharge status: Home / Self Care | Payer: Medicare Other | Source: Ambulatory Visit | Attending: Neurosurgery | Admitting: Neurosurgery

## 2014-08-09 DIAGNOSIS — M5117 Intervertebral disc disorders with radiculopathy, lumbosacral region: Secondary | ICD-10-CM | POA: Diagnosis not present

## 2014-08-09 DIAGNOSIS — M4310 Spondylolisthesis, site unspecified: Secondary | ICD-10-CM

## 2014-08-09 DIAGNOSIS — M4317 Spondylolisthesis, lumbosacral region: Secondary | ICD-10-CM | POA: Insufficient documentation

## 2014-08-09 DIAGNOSIS — M5416 Radiculopathy, lumbar region: Secondary | ICD-10-CM | POA: Diagnosis not present

## 2014-08-09 DIAGNOSIS — M25552 Pain in left hip: Secondary | ICD-10-CM | POA: Diagnosis not present

## 2014-08-11 ENCOUNTER — Encounter: Payer: Self-pay | Admitting: Family Medicine

## 2014-08-11 ENCOUNTER — Ambulatory Visit: Payer: Medicare Other | Admitting: Family Medicine

## 2014-08-16 DIAGNOSIS — F319 Bipolar disorder, unspecified: Secondary | ICD-10-CM | POA: Diagnosis not present

## 2014-08-17 ENCOUNTER — Telehealth: Payer: Self-pay | Admitting: Family Medicine

## 2014-08-19 ENCOUNTER — Ambulatory Visit (INDEPENDENT_AMBULATORY_CARE_PROVIDER_SITE_OTHER): Payer: Medicare Other | Admitting: Family Medicine

## 2014-08-19 ENCOUNTER — Encounter: Payer: Self-pay | Admitting: Family Medicine

## 2014-08-19 VITALS — BP 120/86 | HR 90 | Resp 18 | Ht 63.0 in | Wt 188.1 lb

## 2014-08-19 DIAGNOSIS — M549 Dorsalgia, unspecified: Secondary | ICD-10-CM

## 2014-08-19 DIAGNOSIS — F17200 Nicotine dependence, unspecified, uncomplicated: Secondary | ICD-10-CM

## 2014-08-19 DIAGNOSIS — E669 Obesity, unspecified: Secondary | ICD-10-CM

## 2014-08-19 DIAGNOSIS — R7301 Impaired fasting glucose: Secondary | ICD-10-CM

## 2014-08-19 DIAGNOSIS — E785 Hyperlipidemia, unspecified: Secondary | ICD-10-CM | POA: Diagnosis not present

## 2014-08-19 DIAGNOSIS — I10 Essential (primary) hypertension: Secondary | ICD-10-CM

## 2014-08-19 DIAGNOSIS — E559 Vitamin D deficiency, unspecified: Secondary | ICD-10-CM | POA: Diagnosis not present

## 2014-08-19 DIAGNOSIS — F17210 Nicotine dependence, cigarettes, uncomplicated: Secondary | ICD-10-CM | POA: Diagnosis not present

## 2014-08-19 DIAGNOSIS — E66811 Obesity, class 1: Secondary | ICD-10-CM

## 2014-08-19 LAB — COMPREHENSIVE METABOLIC PANEL
ALT: 13 U/L (ref 0–35)
AST: 14 U/L (ref 0–37)
Albumin: 4.1 g/dL (ref 3.5–5.2)
Alkaline Phosphatase: 109 U/L (ref 39–117)
BUN: 13 mg/dL (ref 6–23)
CO2: 26 mEq/L (ref 19–32)
Calcium: 9.4 mg/dL (ref 8.4–10.5)
Chloride: 98 mEq/L (ref 96–112)
Creat: 0.8 mg/dL (ref 0.50–1.10)
Glucose, Bld: 106 mg/dL — ABNORMAL HIGH (ref 70–99)
Potassium: 4.4 mEq/L (ref 3.5–5.3)
Sodium: 135 mEq/L (ref 135–145)
Total Bilirubin: 0.2 mg/dL (ref 0.2–1.2)
Total Protein: 6.3 g/dL (ref 6.0–8.3)

## 2014-08-19 LAB — CBC
HCT: 40 % (ref 36.0–46.0)
Hemoglobin: 13.3 g/dL (ref 12.0–15.0)
MCH: 31.3 pg (ref 26.0–34.0)
MCHC: 33.3 g/dL (ref 30.0–36.0)
MCV: 94.1 fL (ref 78.0–100.0)
MPV: 10.4 fL (ref 8.6–12.4)
Platelets: 334 10*3/uL (ref 150–400)
RBC: 4.25 MIL/uL (ref 3.87–5.11)
RDW: 12.5 % (ref 11.5–15.5)
WBC: 11.6 10*3/uL — ABNORMAL HIGH (ref 4.0–10.5)

## 2014-08-19 LAB — LIPID PANEL
Cholesterol: 144 mg/dL (ref 0–200)
HDL: 32 mg/dL — ABNORMAL LOW (ref >=46)
LDL Cholesterol: 60 mg/dL (ref 0–99)
Total CHOL/HDL Ratio: 4.5 Ratio
Triglycerides: 258 mg/dL — ABNORMAL HIGH (ref <150)
VLDL: 52 mg/dL — ABNORMAL HIGH (ref 0–40)

## 2014-08-19 LAB — TSH: TSH: 2.066 u[IU]/mL (ref 0.350–4.500)

## 2014-08-19 MED ORDER — KETOROLAC TROMETHAMINE 60 MG/2ML IM SOLN
60.0000 mg | Freq: Once | INTRAMUSCULAR | Status: AC
  Administered 2014-08-19: 60 mg via INTRAMUSCULAR

## 2014-08-19 MED ORDER — METHYLPREDNISOLONE ACETATE 80 MG/ML IJ SUSP
80.0000 mg | Freq: Once | INTRAMUSCULAR | Status: AC
  Administered 2014-08-19: 80 mg via INTRAMUSCULAR

## 2014-08-19 NOTE — Patient Instructions (Addendum)
F/u in 4 months, call if you need me before  Fasting lipid, cmp , HBA1C , TSH, Vit D, CBC today  Now on 5 ciggs per day , plan to quit entirely on May   1800 QUIT NOW call 24 hours per day to help with quitting  You are referred to nutrtionist for help with weight loss  Toradol and depo medrol  today in office for back pain

## 2014-08-20 LAB — HEMOGLOBIN A1C
Hgb A1c MFr Bld: 5.8 % — ABNORMAL HIGH (ref <5.7)
Mean Plasma Glucose: 120 mg/dL — ABNORMAL HIGH (ref <117)

## 2014-08-20 LAB — VITAMIN D 25 HYDROXY (VIT D DEFICIENCY, FRACTURES): Vit D, 25-Hydroxy: 4 ng/mL — ABNORMAL LOW (ref 30–100)

## 2014-08-21 ENCOUNTER — Encounter: Payer: Self-pay | Admitting: Family Medicine

## 2014-08-23 ENCOUNTER — Other Ambulatory Visit: Payer: Self-pay

## 2014-08-23 MED ORDER — NITROGLYCERIN 0.4 MG SL SUBL: 0.4000 mg | SUBLINGUAL_TABLET | SUBLINGUAL | Status: DC | PRN

## 2014-08-24 MED ORDER — VITAMIN D (ERGOCALCIFEROL) 1.25 MG (50000 UNIT) PO CAPS: 50000.0000 [IU] | ORAL_CAPSULE | ORAL | Status: DC

## 2014-08-24 NOTE — Telephone Encounter (Signed)
Patient kept her rescheduled appt on 08/19/14 as required.

## 2014-08-26 ENCOUNTER — Telehealth: Payer: Self-pay | Admitting: *Deleted

## 2014-08-26 NOTE — Telephone Encounter (Signed)
Pt called stating her insurance does not cover any vitamins pt is requesting something else. Please advise

## 2014-08-27 NOTE — Telephone Encounter (Signed)
Patient advised to get otc vit d 3 1000 and take one daily

## 2014-09-20 DIAGNOSIS — E66811 Obesity, class 1: Secondary | ICD-10-CM | POA: Insufficient documentation

## 2014-09-20 DIAGNOSIS — E669 Obesity, unspecified: Secondary | ICD-10-CM | POA: Insufficient documentation

## 2014-09-20 NOTE — Assessment & Plan Note (Signed)
Worsening due to ongoing nicotine use, pt given written material for help with quitting and counselled

## 2014-09-20 NOTE — Assessment & Plan Note (Addendum)
Deteriorated.Referred to nutrtioinist Patient re-educated about  the importance of commitment to a  minimum of 150 minutes of exercise per week.  The importance of healthy food choices with portion control discussed. Encouraged to start a food diary, count calories and to consider  joining a support group. Sample diet sheets offered. Goals set by the patient for the next several months.   Weight /BMI 08/19/2014 06/16/2014 05/30/2014  WEIGHT 188 lb 1.3 oz 180 lb 182 lb  HEIGHT 5\' 3"  5\' 3"  5\' 3"   BMI 33.33 kg/m2 31.89 kg/m2 32.25 kg/m2    Current exercise per week 60 minutes.

## 2014-09-20 NOTE — Assessment & Plan Note (Signed)
Uncontrolled.Toradol and depo medrol administered IM in the office , to be followed by a short course of oral prednisone and NSAIDS.  

## 2014-09-20 NOTE — Assessment & Plan Note (Signed)

## 2014-09-20 NOTE — Assessment & Plan Note (Signed)
Hyperlipidemia:Low fat diet discussed and encouraged. Uncontrolled elevated TG Updated lab needed at/ before next visit.    Lipid Panel  Lab Results  Component Value Date   CHOL 144 08/19/2014   HDL 32* 08/19/2014   LDLCALC 60 08/19/2014   TRIG 258* 08/19/2014   CHOLHDL 4.5 08/19/2014

## 2014-09-20 NOTE — Assessment & Plan Note (Signed)
Controlled, no change in medication DASH diet and commitment to daily physical activity for a minimum of 30 minutes discussed and encouraged, as a part of hypertension management. The importance of attaining a healthy weight is also discussed.  BP/Weight 08/19/2014 06/16/2014 05/30/2014 05/30/2014 05/27/2014 05/02/2014 97/67/3419  Systolic BP 379 024 097 353 299 242 92  Diastolic BP 86 81 76 88 78 66 64  Wt. (Lbs) 188.08 180 182 182 182 180 182  BMI 33.33 31.89 32.25 32.25 32.25 31.89 33.28

## 2014-09-20 NOTE — Progress Notes (Signed)
Subjective:    Patient ID: Brooke Maddox, female    DOB: 07/10/1970, 44 y.o.   MRN: [id number]  HPI The PT is here for follow up and re-evaluation of chronic medical conditions, medication management and review of any available recent lab and radiology data.  Preventive health is updated, specifically  Cancer screening and Immunization.   Questions or concerns regarding consultations or procedures which the PT has had in the interim are  addressed. The PT denies any adverse reactions to current medications since the last visit.  C/o increased back pain wants injection today for this. Anxious for help with weight loss and to quit smoking, wants back surgery      Review of Systems See HPI Denies recent fever or chills. Denies sinus pressure, nasal congestion, ear pain or sore throat. Denies chest congestion, productive cough or wheezing. Denies chest pains, palpitations and leg swelling Denies abdominal pain, nausea, vomiting,diarrhea or constipation.   Denies dysuria, frequency, hesitancy or incontinence.  Denies headaches, seizures, numbness, or tingling. Denies uncontrolled  depression, anxiety or insomnia. Denies skin break down or rash.        Objective:   Physical Exam BP 120/86 mmHg  Pulse 90  Resp 18  Ht 5\' 3"  (1.6 m)  Wt 188 lb 1.3 oz (85.313 kg)  BMI 33.33 kg/m2  SpO2 96% Patient alert and oriented and in no cardiopulmonary distress.  HEENT: No facial asymmetry, EOMI,   oropharynx pink and moist.  Neck supple no JVD, no mass.  Chest: Clear to auscultation bilaterally.decreased air entry througout  CVS: S1, S2 no murmurs, no S3.Regular rate.  ABD: Soft non tender.   Ext: No edema  MS: Decreased  ROM spine, adequate in shoulders, hips and knees.  Skin: Intact, no ulcerations or rash noted.  Psych: Good eye contact, normal affect. Memory intact not anxious or depressed appearing.  CNS: CN 2-12 intact, power,  normal throughout.no focal deficits  noted.        Assessment & Plan:  Essential hypertension Controlled, no change in medication DASH diet and commitment to daily physical activity for a minimum of 30 minutes discussed and encouraged, as a part of hypertension management. The importance of attaining a healthy weight is also discussed.  BP/Weight 08/19/2014 06/16/2014 05/30/2014 05/30/2014 05/27/2014 05/02/2014 89/38/1017  Systolic BP 510 258 527 782 423 536 92  Diastolic BP 86 81 76 88 78 66 64  Wt. (Lbs) 188.08 180 182 182 182 180 182  BMI 33.33 31.89 32.25 32.25 32.25 31.89 33.28         COPD Worsening due to ongoing nicotine use, pt given written material for help with quitting and counselled   NICOTINE ADDICTION Patient counseled for approximately 5 minutes regarding the health risks of ongoing nicotine use, specifically all types of cancer, heart disease, stroke and respiratory failure. The options available for help with cessation ,the behavioral changes to assist the process, and the option to either gradully reduce usage  Or abruptly stop.is also discussed. Pt is also encouraged to set specific goals in number of cigarettes used daily, as well as to set a quit date.  Number of cigarettes/cigars currently smoking daily: 3 to 5    Hyperlipemia Hyperlipidemia:Low fat diet discussed and encouraged. Uncontrolled elevated TG Updated lab needed at/ before next visit.    Lipid Panel  Lab Results  Component Value Date   CHOL 144 08/19/2014   HDL 32* 08/19/2014   LDLCALC 60 08/19/2014   TRIG 258* 08/19/2014  CHOLHDL 4.5 08/19/2014         Backache Uncontrolled.Toradol and depo medrol administered IM in the office , to be followed by a short course of oral prednisone and NSAIDS.    Obesity (BMI 30.0-34.9) Deteriorated.Referred to nutrtioinist Patient re-educated about  the importance of commitment to a  minimum of 150 minutes of exercise per week.  The importance of healthy food choices with  portion control discussed. Encouraged to start a food diary, count calories and to consider  joining a support group. Sample diet sheets offered. Goals set by the patient for the next several months.   Weight /BMI 08/19/2014 06/16/2014 05/30/2014  WEIGHT 188 lb 1.3 oz 180 lb 182 lb  HEIGHT 5\' 3"  5\' 3"  5\' 3"   BMI 33.33 kg/m2 31.89 kg/m2 32.25 kg/m2    Current exercise per week 60 minutes.

## 2014-10-05 ENCOUNTER — Ambulatory Visit: Payer: Medicare Other | Admitting: Family Medicine

## 2014-10-13 ENCOUNTER — Ambulatory Visit (INDEPENDENT_AMBULATORY_CARE_PROVIDER_SITE_OTHER): Payer: Medicare Other | Admitting: Family Medicine

## 2014-10-13 ENCOUNTER — Encounter: Payer: Self-pay | Admitting: Family Medicine

## 2014-10-13 VITALS — BP 130/84 | HR 76 | Resp 16 | Ht 63.0 in | Wt 174.0 lb

## 2014-10-13 DIAGNOSIS — T148 Other injury of unspecified body region: Secondary | ICD-10-CM | POA: Diagnosis not present

## 2014-10-13 DIAGNOSIS — F17210 Nicotine dependence, cigarettes, uncomplicated: Secondary | ICD-10-CM | POA: Diagnosis not present

## 2014-10-13 DIAGNOSIS — R5383 Other fatigue: Secondary | ICD-10-CM

## 2014-10-13 DIAGNOSIS — R110 Nausea: Secondary | ICD-10-CM | POA: Insufficient documentation

## 2014-10-13 DIAGNOSIS — M549 Dorsalgia, unspecified: Secondary | ICD-10-CM | POA: Diagnosis not present

## 2014-10-13 DIAGNOSIS — Z720 Tobacco use: Secondary | ICD-10-CM

## 2014-10-13 DIAGNOSIS — E785 Hyperlipidemia, unspecified: Secondary | ICD-10-CM

## 2014-10-13 DIAGNOSIS — R7301 Impaired fasting glucose: Secondary | ICD-10-CM

## 2014-10-13 DIAGNOSIS — I10 Essential (primary) hypertension: Secondary | ICD-10-CM

## 2014-10-13 DIAGNOSIS — E669 Obesity, unspecified: Secondary | ICD-10-CM

## 2014-10-13 DIAGNOSIS — F17200 Nicotine dependence, unspecified, uncomplicated: Secondary | ICD-10-CM

## 2014-10-13 DIAGNOSIS — D72829 Elevated white blood cell count, unspecified: Secondary | ICD-10-CM

## 2014-10-13 DIAGNOSIS — T148XXA Other injury of unspecified body region, initial encounter: Secondary | ICD-10-CM | POA: Insufficient documentation

## 2014-10-13 MED ORDER — ONDANSETRON HCL 4 MG/2ML IJ SOLN
4.0000 mg | Freq: Once | INTRAMUSCULAR | Status: AC
  Administered 2014-10-13: 4 mg via INTRAMUSCULAR

## 2014-10-13 MED ORDER — KETOROLAC TROMETHAMINE 60 MG/2ML IM SOLN
60.0000 mg | Freq: Once | INTRAMUSCULAR | Status: AC
  Administered 2014-10-13: 60 mg via INTRAMUSCULAR

## 2014-10-13 NOTE — Assessment & Plan Note (Signed)

## 2014-10-13 NOTE — Patient Instructions (Addendum)
F/u as before  You are referred to hematologist re bruising  Toradol today for generalized pain  Zofran today for nausea  Call [place] to get hwelp with smoking cessation  It is important that you exercise regularly at least 30 minutes 5 times a week. If you develop chest pain, have severe difficulty breathing, or feel very tired, stop exercising immediately and seek medical attention   A healthy diet is rich in fruit, vegetables and whole grains. Poultry fish, nuts and beans are a healthy choice for protein rather then red meat. A low sodium diet and drinking 64 ounces of water daily is generally recommended. Oils and sweet should be limited. Carbohydrates especially for those who are diabetic or overweight, should be limited to 34-45 gram per meal. It is important to eat on a regular schedule, at least 3 times daily. Snacks should be primarily fruits, vegetables or nuts.   Thanks for choosing [facility], we consider it a privelige to serve you.

## 2014-10-14 ENCOUNTER — Other Ambulatory Visit: Payer: Self-pay

## 2014-10-14 ENCOUNTER — Other Ambulatory Visit: Payer: Self-pay | Admitting: Family Medicine

## 2014-10-14 MED ORDER — CLONIDINE HCL 0.1 MG PO TABS: ORAL_TABLET | ORAL | Status: DC

## 2014-10-14 MED ORDER — ROSUVASTATIN CALCIUM 20 MG PO TABS: ORAL_TABLET | ORAL | Status: DC

## 2014-10-15 ENCOUNTER — Other Ambulatory Visit: Payer: Self-pay

## 2014-10-22 ENCOUNTER — Encounter (HOSPITAL_COMMUNITY): Payer: Self-pay | Admitting: Oncology

## 2014-10-22 ENCOUNTER — Encounter (HOSPITAL_COMMUNITY): Payer: Medicare Other | Attending: Oncology | Admitting: Oncology

## 2014-10-22 VITALS — BP 139/78 | HR 74 | Temp 97.7°F | Resp 18 | Ht 62.5 in | Wt 174.9 lb

## 2014-10-22 DIAGNOSIS — T148XXA Other injury of unspecified body region, initial encounter: Secondary | ICD-10-CM

## 2014-10-22 DIAGNOSIS — D72829 Elevated white blood cell count, unspecified: Secondary | ICD-10-CM | POA: Diagnosis not present

## 2014-10-22 DIAGNOSIS — E785 Hyperlipidemia, unspecified: Secondary | ICD-10-CM | POA: Insufficient documentation

## 2014-10-22 DIAGNOSIS — I10 Essential (primary) hypertension: Secondary | ICD-10-CM | POA: Insufficient documentation

## 2014-10-22 DIAGNOSIS — K219 Gastro-esophageal reflux disease without esophagitis: Secondary | ICD-10-CM | POA: Insufficient documentation

## 2014-10-22 DIAGNOSIS — Z79899 Other long term (current) drug therapy: Secondary | ICD-10-CM | POA: Insufficient documentation

## 2014-10-22 DIAGNOSIS — J449 Chronic obstructive pulmonary disease, unspecified: Secondary | ICD-10-CM | POA: Insufficient documentation

## 2014-10-22 DIAGNOSIS — E669 Obesity, unspecified: Secondary | ICD-10-CM | POA: Insufficient documentation

## 2014-10-22 DIAGNOSIS — G40909 Epilepsy, unspecified, not intractable, without status epilepticus: Secondary | ICD-10-CM | POA: Insufficient documentation

## 2014-10-22 DIAGNOSIS — M1990 Unspecified osteoarthritis, unspecified site: Secondary | ICD-10-CM | POA: Insufficient documentation

## 2014-10-22 DIAGNOSIS — F418 Other specified anxiety disorders: Secondary | ICD-10-CM | POA: Insufficient documentation

## 2014-10-22 DIAGNOSIS — F319 Bipolar disorder, unspecified: Secondary | ICD-10-CM | POA: Insufficient documentation

## 2014-10-22 DIAGNOSIS — T148 Other injury of unspecified body region: Secondary | ICD-10-CM | POA: Insufficient documentation

## 2014-10-22 DIAGNOSIS — R51 Headache: Secondary | ICD-10-CM | POA: Insufficient documentation

## 2014-10-22 DIAGNOSIS — F17210 Nicotine dependence, cigarettes, uncomplicated: Secondary | ICD-10-CM | POA: Insufficient documentation

## 2014-10-22 DIAGNOSIS — X58XXXA Exposure to other specified factors, initial encounter: Secondary | ICD-10-CM | POA: Insufficient documentation

## 2014-10-22 DIAGNOSIS — R569 Unspecified convulsions: Secondary | ICD-10-CM | POA: Insufficient documentation

## 2014-10-22 DIAGNOSIS — M549 Dorsalgia, unspecified: Secondary | ICD-10-CM | POA: Insufficient documentation

## 2014-10-22 HISTORY — DX: Elevated white blood cell count, unspecified: D72.829

## 2014-10-22 NOTE — Assessment & Plan Note (Signed)
Recurrent bruising, symmetrical in lower extremities only.  No clear findings suggestive of pathologic bruising.  No truncal, chest, or back bruising.    Labs next week: CBC diff, PFA, Vit K, PT/INR, aPTT, and peripheral flow cytometry.  She is encouraged to use cold compresses on bruising.  Return in 3-4 weeks for follow-up.

## 2014-10-22 NOTE — Progress Notes (Signed)
[facility] Hematology/Oncology Consultation   Name: Brooke Maddox      MRN: [id number]    Location: Room/bed info not found  Date: 10/22/2014 Time:4:53 PM   REFERRING PHYSICIAN:  Tula Nakayama, MD  REASON FOR CONSULT:  Easy bruising   DIAGNOSIS:  Easy bruising with a mild leukocytosis dating back to at least May 2010  HISTORY OF PRESENT ILLNESS:   Ms Brooke Maddox is a 44 year old white American woman with a past medical history significant for seizure disorder, osteoarthritis, obesity, tobacco abuse, hyperlipidemia, headaches, GERD, HTN, COPD, chronic back pain, and Bipolar affective disorder who is referred to the [facility] for easy bruising in the setting of a normal platelet count and Hgb with a mild leukocytosis that has been present and stable since at least May 2010.  Chart is reviewed.  I personally reviewed and went over laboratory results with the patient.  The results are noted within this dictation.  Review of labs demonstrate a normal Hgb and platelet count.  Leukocytosis with neutrophilia and occasional lymphocytosis is noted dating back to at least May 2010 in [facility]  I personally reviewed and went over radiographic studies with the patient.  The results are noted within this dictation.   She is up-to-date on mammography with last mammogram being BIRADS 1 in November 2015.  The patient reports that she stopped taking ASA about 2 years ago after she was cleared from a cardiac standpoint for concerns for a murmur, the patient reports.  She denies any OTC NSAID use, BC powder, Goody powder, etc.  She denies any herbs.    She reports recurrent bruises in bilateral upper thighs.  She notes that they appeared overnight without any known trauma or inciting factor.  She notes that they would come and go over the past 3 months.  She also reports bruising bilaterally in popliteal regions. Some small ecchymoses are noted elsewhere on LE.  She does not have truncal, chest, or back  bruising.  She denies any contact sports or activities.  She denies leaning against counters.  She notes that they are very painful and interfere with her ability to ambulate.    She notes that she is a fall risk due to "back issues."  She reports that she is due for back surgery, but surgery has been delayed due to bruising.  She reports that her left leg goes numb and therefore, she falls occasionally.  Her last fall was 1 month ago.  "I only fractured my toe."  She denies any other injuries related to this fall.  She denies any blood in stool, black tarry stool, abdominal pain, chest pain, hematuria, hemoptysis.   PAST MEDICAL HISTORY:   Past Medical History  Diagnosis Date  . Hypertension   . Arthritis   . Depression   . Anxiety associated with depression 2007    hospitalised for mental health problems   . Seizures     Dr. Theda Sers   . Migraines   . Chronic back pain   . History of seizure disorder   . History of leukocytosis   . Bipolar affective disorder   . Osteoarthritis     mild   . Abdominal pain, acute, left lower quadrant   . Chronic constipation   . Nausea   . Nicotine addiction   . Obesity   . Fatigue   . Acute knee pain     right   . Hyperlipidemia   .  COPD (chronic obstructive pulmonary disease)   . Collagen vascular disease   . Renal insufficiency   . Lumbar radiculopathy     left  . Chronic leg pain     left  . Pain management   . Leukocytosis 10/22/2014    ALLERGIES: Allergies  Allergen Reactions  . Dilaudid [Hydromorphone]     Gives patient a huge headache  . Lithium Carbonate Other (See Comments)    Shakes   . Morphine And Related Other (See Comments)    Extended Drowsiness.   . Sulfur     Rash   . Tylenol [Acetaminophen]     Pt states that regular tylenol makes her sick but she can take tylenol when mixed with another medication such as percocet or Vicodin she does not get sick,   . Wellbutrin [Bupropion] Other (See Comments)    H/o seizure  d/o in medical record   . Codeine Nausea And Vomiting  . Divalproex Sodium Rash  . Fentanyl Rash  . Iodine Rash  . Penicillins Nausea And Vomiting and Rash      MEDICATIONS: I have reviewed the patient's current medications.    Current Outpatient Prescriptions on File Prior to Visit  Medication Sig Dispense Refill  . ALPRAZolam (XANAX) 1 MG tablet Take 1 mg by mouth 3 (three) times daily.     . beclomethasone (QVAR) 40 MCG/ACT inhaler Inhale 1 puff into the lungs 2 (two) times daily. 1 Inhaler 3  . cloNIDine (CATAPRES) 0.1 MG tablet TAKE 1 TABLET BY MOUTH EVERY NIGHT AT BEDTIME 30 tablet 2  . cyclobenzaprine (FLEXERIL) 10 MG tablet Take 1 tablet (10 mg total) by mouth 3 (three) times daily as needed for muscle spasms. 20 tablet 0  . FLUoxetine (PROZAC) 40 MG capsule Take 40 mg by mouth every morning.    . lamoTRIgine (LAMICTAL) 150 MG tablet Take 300 mg by mouth every morning.     Marland Kitchen levothyroxine (SYNTHROID, LEVOTHROID) 25 MCG tablet Take 1 tablet (25 mcg total) by mouth daily. 30 tablet 2  . levothyroxine (SYNTHROID, LEVOTHROID) 25 MCG tablet TAKE 1 TABLET BY MOUTH EVERY DAY 30 tablet 2  . rosuvastatin (CRESTOR) 20 MG tablet TAKE 1 TABLET BY MOUTH EVERY NIGHT AT BEDTIME 30 tablet 2  . Vitamin D, Ergocalciferol, (DRISDOL) 50000 UNITS CAPS capsule Take 1 capsule (50,000 Units total) by mouth every 7 (seven) days. 4 capsule 5  . nitroGLYCERIN (NITROSTAT) 0.4 MG SL tablet Place 1 tablet (0.4 mg total) under the tongue every 5 (five) minutes as needed for chest pain. (Patient not taking: Reported on 10/22/2014) 25 tablet 3   No current facility-administered medications on file prior to visit.     PAST SURGICAL HISTORY Past Surgical History  Procedure Laterality Date  . Vesicovaginal fistula closure w/ tah  2009  . Abdominal hysterectomy    . Left heart catheterization with coronary angiogram N/A 03/17/2013    Procedure: LEFT HEART CATHETERIZATION WITH CORONARY ANGIOGRAM;  Surgeon: Peter M  Martinique, MD;  Location[facility] CATH LAB;  Service: Cardiovascular;  Laterality: N/A;    FAMILY HISTORY: Family History  Problem Relation Age of Onset  . COPD Father   . Hypertension Father   . Arthritis Brother     SOCIAL HISTORY:  reports that she has been smoking Cigarettes.  She started smoking about 28 years ago. She has a 17.5 pack-year smoking history. She has never used smokeless tobacco. She reports that she does not drink alcohol or use illicit drugs.  She has a boyfriend of 9 years.  She has 1 daughter and 1 son, both are alive and well.  She has 1 grandchild.  Her mother is alive and in her 64's.  She has not seen or spoken to her mother in 36 years.  Her father is 3's and he is estranged as well, but she reports he has HTN.  PERFORMANCE STATUS: The patient's performance status is 1 - Symptomatic but completely ambulatory  PHYSICAL EXAM: Most Recent Vital Signs: Blood pressure 139/78, pulse 74, temperature 97.7 F (36.5 C), temperature source Oral, resp. rate 18, height 5' 2.5" (1.588 m), weight 174 lb 14.4 oz (79.334 kg), SpO2 99 %. General appearance: alert, cooperative, appears stated age, no distress, moderately obese and chronically ill appearing Head: Normocephalic, without obvious abnormality, atraumatic Eyes: negative findings: lids and lashes normal, conjunctivae and sclerae normal, corneas clear and pupils equal, round, reactive to light and accomodation Throat: normal findings: lips normal without lesions and buccal mucosa normal Neck: no adenopathy and supple, symmetrical, trachea midline Lungs: diminished breath sounds bilaterally Heart: regular rate and rhythm, S1, S2 normal, no murmur, click, rub or gallop Abdomen: normal findings: no masses palpable and soft and abnormal findings:  abnormally marked with light palpation tenderness in the RUQ Extremities: Homans sign is negative, no sign of DVT and large ecchymoses on bilateral upper thighs (symmetric) measuring  about 6-8 cm in size, oval in shape.  Right popliteal bruises.  Scattered small ecchymoses on LE bilaterally. Pulses: 2+ and symmetric Skin: normal, mobility and turgor normal, nails clear without discoloration or sign of infection and no edema   Left upper thigh, distal to groin    Right upper thigh, distal to groin    Right popliteal area     Left thigh   Lymph nodes: Cervical, supraclavicular, and axillary nodes normal. Neurologic: Alert and oriented X 3, normal strength and tone. Normal symmetric reflexes. Normal coordination and gait  LABORATORY DATA:  CBC    Component Value Date/Time   WBC 11.6* 08/19/2014 1119   RBC 4.25 08/19/2014 1119   HGB 13.3 08/19/2014 1119   HCT 40.0 08/19/2014 1119   PLT 334 08/19/2014 1119   MCV 94.1 08/19/2014 1119   MCH 31.3 08/19/2014 1119   MCHC 33.3 08/19/2014 1119   RDW 12.5 08/19/2014 1119   LYMPHSABS 2.9 11/21/2013 0008   MONOABS 1.0 11/21/2013 0008   EOSABS 0.0 11/21/2013 0008   BASOSABS 0.0 11/21/2013 0008      Chemistry      Component Value Date/Time   NA 135 08/19/2014 1119   K 4.4 08/19/2014 1119   CL 98 08/19/2014 1119   CO2 26 08/19/2014 1119   BUN 13 08/19/2014 1119   CREATININE 0.80 08/19/2014 1119   CREATININE 0.74 11/21/2013 0008      Component Value Date/Time   CALCIUM 9.4 08/19/2014 1119   ALKPHOS 109 08/19/2014 1119   AST 14 08/19/2014 1119   ALT 13 08/19/2014 1119   BILITOT 0.2 08/19/2014 1119       ASSESSMENT/PLAN:   Bruising Recurrent bruising, symmetrical in lower extremities only.  No clear findings suggestive of pathologic bruising.  No truncal, chest, or back bruising.    Labs next week: CBC diff, PFA, Vit K, PT/INR, aPTT, and peripheral flow cytometry.  She is encouraged to use cold compresses on bruising.  Return in 3-4 weeks for follow-up.  Leukocytosis Likely secondary to tobacco abuse.  Peripheral flow cytometry will be drawn next week.  All questions were answered.  The patient knows to call the clinic with any problems, questions or concerns. We can certainly see the patient much sooner if necessary.  Patient and plan discussed with Dr. Ancil Linsey and she is in agreement with the aforementioned.   This note is electronically signed by: Doy Mince 10/22/2014 4:53 PM    Patient was seen and examined. I agree with the details as documented above. Her bruising is surprisingly symmetrical involving the anterior upper thighs bilaterally and the popliteal spaces. She does not have any bruising on the trunk or back. She denies the use of any NSAIDS. I have recommended a CBC, a peripheral smear review, PFA-100. I will also check a PT, PTT and vitamin K level. Given her persistent mild leukocytosis we will send a flow cytometry. Advised we will keep her apprised of the results of her study should anything come back abnormal prior to follow-up. We will review all of her labs otherwise at her next follow-up to make additional recommendations from there. If her PFA-100 comes back abnormal and does not indicate the use of NSAIDS then consider referral to an academic center for additional platelet function testing. Molli Hazard, MD

## 2014-10-22 NOTE — Assessment & Plan Note (Signed)
Likely secondary to tobacco abuse.  Peripheral flow cytometry will be drawn next week.

## 2014-10-22 NOTE — Patient Instructions (Addendum)
..  [place] at [facility] Discharge Instructions  RECOMMENDATIONS MADE BY THE CONSULTANT AND ANY TEST RESULTS WILL BE SENT TO YOUR REFERRING PHYSICIAN.  We will do labs one day next week  Return to see the Dr. In 3-4 weeks  You can use ice packs for pain at the site of the bruises    Thank you for choosing [place] at [facility] to provide your oncology and hematology care.  To afford each patient quality time with our provider, please arrive at least 15 minutes before your scheduled appointment time.    You need to re-schedule your appointment should you arrive 10 or more minutes late.  We strive to give you quality time with our providers, and arriving late affects you and other patients whose appointments are after yours.  Also, if you no show three or more times for appointments you may be dismissed from the clinic at the providers discretion.     Again, thank you for choosing [facility].  Our hope is that these requests will decrease the amount of time that you wait before being seen by our physicians.       _____________________________________________________________  Should you have questions after your visit to [facility], please contact our office at [phone] between the hours of 8:30 a.m. and 4:30 p.m.  Voicemails left after 4:30 p.m. will not be returned until the following business day.  For prescription refill requests, have your pharmacy contact our office.

## 2014-10-27 ENCOUNTER — Ambulatory Visit: Payer: Medicare Other | Admitting: Nutrition

## 2014-10-28 ENCOUNTER — Encounter (HOSPITAL_BASED_OUTPATIENT_CLINIC_OR_DEPARTMENT_OTHER): Payer: Medicare Other

## 2014-10-28 DIAGNOSIS — T148XXA Other injury of unspecified body region, initial encounter: Secondary | ICD-10-CM

## 2014-10-28 DIAGNOSIS — F319 Bipolar disorder, unspecified: Secondary | ICD-10-CM | POA: Diagnosis not present

## 2014-10-28 DIAGNOSIS — F17210 Nicotine dependence, cigarettes, uncomplicated: Secondary | ICD-10-CM | POA: Diagnosis not present

## 2014-10-28 DIAGNOSIS — D72820 Lymphocytosis (symptomatic): Secondary | ICD-10-CM | POA: Diagnosis not present

## 2014-10-28 DIAGNOSIS — J449 Chronic obstructive pulmonary disease, unspecified: Secondary | ICD-10-CM | POA: Diagnosis not present

## 2014-10-28 DIAGNOSIS — I10 Essential (primary) hypertension: Secondary | ICD-10-CM | POA: Diagnosis not present

## 2014-10-28 DIAGNOSIS — M549 Dorsalgia, unspecified: Secondary | ICD-10-CM | POA: Diagnosis not present

## 2014-10-28 DIAGNOSIS — T148 Other injury of unspecified body region: Secondary | ICD-10-CM | POA: Diagnosis not present

## 2014-10-28 DIAGNOSIS — M1990 Unspecified osteoarthritis, unspecified site: Secondary | ICD-10-CM | POA: Diagnosis not present

## 2014-10-28 DIAGNOSIS — D72829 Elevated white blood cell count, unspecified: Secondary | ICD-10-CM | POA: Diagnosis not present

## 2014-10-28 DIAGNOSIS — G40909 Epilepsy, unspecified, not intractable, without status epilepticus: Secondary | ICD-10-CM | POA: Diagnosis not present

## 2014-10-28 DIAGNOSIS — E669 Obesity, unspecified: Secondary | ICD-10-CM | POA: Diagnosis not present

## 2014-10-28 DIAGNOSIS — K219 Gastro-esophageal reflux disease without esophagitis: Secondary | ICD-10-CM | POA: Diagnosis not present

## 2014-10-28 DIAGNOSIS — X58XXXA Exposure to other specified factors, initial encounter: Secondary | ICD-10-CM | POA: Diagnosis not present

## 2014-10-28 DIAGNOSIS — R51 Headache: Secondary | ICD-10-CM | POA: Diagnosis not present

## 2014-10-28 DIAGNOSIS — E785 Hyperlipidemia, unspecified: Secondary | ICD-10-CM | POA: Diagnosis not present

## 2014-10-28 DIAGNOSIS — Z79899 Other long term (current) drug therapy: Secondary | ICD-10-CM | POA: Diagnosis not present

## 2014-10-28 DIAGNOSIS — F418 Other specified anxiety disorders: Secondary | ICD-10-CM | POA: Diagnosis not present

## 2014-10-28 DIAGNOSIS — R569 Unspecified convulsions: Secondary | ICD-10-CM | POA: Diagnosis not present

## 2014-10-28 LAB — CBC WITH DIFFERENTIAL/PLATELET
Basophils Absolute: 0.1 10*3/uL (ref 0.0–0.1)
Basophils Relative: 0 % (ref 0–1)
Eosinophils Absolute: 0.3 10*3/uL (ref 0.0–0.7)
Eosinophils Relative: 2 % (ref 0–5)
HCT: 42.4 % (ref 36.0–46.0)
Hemoglobin: 14.5 g/dL (ref 12.0–15.0)
Lymphocytes Relative: 31 % (ref 12–46)
Lymphs Abs: 3.5 10*3/uL (ref 0.7–4.0)
MCH: 32.4 pg (ref 26.0–34.0)
MCHC: 34.2 g/dL (ref 30.0–36.0)
MCV: 94.6 fL (ref 78.0–100.0)
Monocytes Absolute: 0.7 10*3/uL (ref 0.1–1.0)
Monocytes Relative: 7 % (ref 3–12)
Neutro Abs: 6.6 10*3/uL (ref 1.7–7.7)
Neutrophils Relative %: 60 % (ref 43–77)
Platelets: 342 10*3/uL (ref 150–400)
RBC: 4.48 MIL/uL (ref 3.87–5.11)
RDW: 13.1 % (ref 11.5–15.5)
WBC: 11.1 10*3/uL — ABNORMAL HIGH (ref 4.0–10.5)

## 2014-10-28 LAB — PLATELET FUNCTION ASSAY
Collagen / ADP: 73 seconds (ref 0–118)
Collagen / Epinephrine: >300 seconds — ABNORMAL HIGH (ref 0–193)

## 2014-10-28 LAB — PROTIME-INR
INR: 1.09 (ref 0.00–1.49)
Prothrombin Time: 14.3 seconds (ref 11.6–15.2)

## 2014-10-28 LAB — APTT: aPTT: 34 seconds (ref 24–37)

## 2014-10-28 NOTE — Progress Notes (Signed)
Labs drawn for pfa,vit k1,pt, ptt,cbcd and flow

## 2014-11-02 LAB — MISCELLANEOUS TEST

## 2014-11-08 DIAGNOSIS — F319 Bipolar disorder, unspecified: Secondary | ICD-10-CM | POA: Diagnosis not present

## 2014-11-12 ENCOUNTER — Other Ambulatory Visit (HOSPITAL_COMMUNITY)
Admission: RE | Admit: 2014-11-12 | Discharge: 2014-11-12 | Disposition: A | Discharge status: Home / Self Care | Payer: Medicare Other | Source: Ambulatory Visit | Attending: Hematology & Oncology | Admitting: Hematology & Oncology

## 2014-11-12 ENCOUNTER — Encounter (HOSPITAL_COMMUNITY): Payer: Self-pay | Admitting: Hematology & Oncology

## 2014-11-12 ENCOUNTER — Encounter (HOSPITAL_BASED_OUTPATIENT_CLINIC_OR_DEPARTMENT_OTHER): Payer: Medicare Other | Admitting: Hematology & Oncology

## 2014-11-12 VITALS — BP 127/71 | HR 89 | Temp 97.7°F | Resp 16 | Wt 177.0 lb

## 2014-11-12 DIAGNOSIS — J449 Chronic obstructive pulmonary disease, unspecified: Secondary | ICD-10-CM | POA: Diagnosis not present

## 2014-11-12 DIAGNOSIS — K219 Gastro-esophageal reflux disease without esophagitis: Secondary | ICD-10-CM | POA: Diagnosis not present

## 2014-11-12 DIAGNOSIS — R799 Abnormal finding of blood chemistry, unspecified: Secondary | ICD-10-CM | POA: Diagnosis not present

## 2014-11-12 DIAGNOSIS — I10 Essential (primary) hypertension: Secondary | ICD-10-CM | POA: Diagnosis not present

## 2014-11-12 DIAGNOSIS — D72829 Elevated white blood cell count, unspecified: Secondary | ICD-10-CM | POA: Diagnosis not present

## 2014-11-12 DIAGNOSIS — T148 Other injury of unspecified body region: Secondary | ICD-10-CM | POA: Diagnosis not present

## 2014-11-12 DIAGNOSIS — R51 Headache: Secondary | ICD-10-CM | POA: Diagnosis not present

## 2014-11-12 DIAGNOSIS — T148XXA Other injury of unspecified body region, initial encounter: Secondary | ICD-10-CM

## 2014-11-12 LAB — PLATELET FUNCTION ASSAY
Collagen / ADP: 80 seconds (ref 0–118)
Collagen / Epinephrine: >300 seconds — ABNORMAL HIGH (ref 0–193)

## 2014-11-12 LAB — SALICYLATE LEVEL: Salicylate Lvl: <4 mg/dL (ref 2.8–30.0)

## 2014-11-12 NOTE — Progress Notes (Signed)
[facility] Hematology/Oncology Consultation   Name: Brooke Maddox      MRN: [id number]   Date: 12/07/2014 Time:8:18 AM   REFERRING PHYSICIAN:  Tula Nakayama, MD  REASON FOR CONSULT:  Easy bruising   DIAGNOSIS:  Easy bruising with a mild leukocytosis dating back to at least May 2010 Platelet function assay with collagen/epinephrine > 300 seconds c/w drug induced platelet dysfunction  HISTORY OF PRESENT ILLNESS:   Brooke Maddox is a 44 year old white American woman with a past medical history significant for seizure disorder, osteoarthritis, obesity, tobacco abuse, hyperlipidemia, headaches, GERD, HTN, COPD, chronic back pain, and Bipolar affective disorder who is referred to the [facility] for easy bruising in the setting of a normal platelet count and Hgb with a mild leukocytosis that has been present and stable since at least May 2010.  The patient reports that she stopped taking ASA about 2 years ago after she was cleared from a cardiac standpoint for concerns for a murmur, the patient reports.  She denies any OTC NSAID use, BC powder, Goody powder, etc.  She denies any herbs.    The patient is here today to discuss the results of her tests. The bruises on her thighs and legs are now resolved. She denies any other bleeding such as nose or gum bleeding. She has no additional bruises. She does not take any supplements.  She was taking potassium medications but she has not been lately after being told she cannot crush the pills to take them.  She complains of vomiting occasionally the past few weeks after eating. She denies, fever, chills, abdominal pain, constipation or diarrhea.  PAST MEDICAL HISTORY:   Past Medical History  Diagnosis Date  . Hypertension   . Arthritis   . Depression   . Anxiety associated with depression 2007    hospitalised for mental health problems   . Seizures     Dr. Theda Sers   . Migraines   . Chronic back pain   . History of seizure disorder   .  History of leukocytosis   . Bipolar affective disorder   . Osteoarthritis     mild   . Abdominal pain, acute, left lower quadrant   . Chronic constipation   . Nausea   . Nicotine addiction   . Obesity   . Fatigue   . Acute knee pain     right   . Hyperlipidemia   . COPD (chronic obstructive pulmonary disease)   . Collagen vascular disease   . Renal insufficiency   . Lumbar radiculopathy     left  . Chronic leg pain     left  . Pain management   . Leukocytosis 10/22/2014    ALLERGIES: Allergies  Allergen Reactions  . Dilaudid [Hydromorphone]     Gives patient a huge headache  . Lithium Carbonate Other (See Comments)    Shakes   . Morphine And Related Other (See Comments)    Extended Drowsiness-headache  . Sulfur     Rash   . Tylenol [Acetaminophen]     Pt states that regular tylenol makes her sick but she can take tylenol when mixed with another medication such as percocet or Vicodin she does not get sick,   . Wellbutrin [Bupropion] Other (See Comments)    H/o seizure d/o in medical record   . Codeine Nausea And Vomiting  . Divalproex Sodium Rash  . Fentanyl Rash  . Iodine Rash  .  Iohexol Rash  . Penicillins Nausea And Vomiting and Rash      MEDICATIONS: I have reviewed the patient's current medications.    Current Outpatient Prescriptions on File Prior to Visit  Medication Sig Dispense Refill  . ALPRAZolam (XANAX) 1 MG tablet Take 1 mg by mouth 3 (three) times daily.     . beclomethasone (QVAR) 40 MCG/ACT inhaler Inhale 1 puff into the lungs 2 (two) times daily. 1 Inhaler 3  . cloNIDine (CATAPRES) 0.1 MG tablet TAKE 1 TABLET BY MOUTH EVERY NIGHT AT BEDTIME (Patient taking differently: Take 0.1 mg by mouth at bedtime. ) 30 tablet 2  . levothyroxine (SYNTHROID, LEVOTHROID) 25 MCG tablet TAKE 1 TABLET BY MOUTH EVERY DAY 30 tablet 2  . nitroGLYCERIN (NITROSTAT) 0.4 MG SL tablet Place 1 tablet (0.4 mg total) under the tongue every 5 (five) minutes as needed for chest  pain. 25 tablet 3  . rizatriptan (MAXALT-MLT) 5 MG disintegrating tablet Take 5 mg by mouth as needed for migraine. May repeat in 2 hours if needed    . rosuvastatin (CRESTOR) 20 MG tablet TAKE 1 TABLET BY MOUTH EVERY NIGHT AT BEDTIME 30 tablet 2  . cyclobenzaprine (FLEXERIL) 10 MG tablet Take 1 tablet (10 mg total) by mouth 3 (three) times daily as needed for muscle spasms. (Patient not taking: Reported on 12/06/2014) 20 tablet 0  . Vitamin D, Ergocalciferol, (DRISDOL) 50000 UNITS CAPS capsule Take 1 capsule (50,000 Units total) by mouth every 7 (seven) days. (Patient not taking: Reported on 12/06/2014) 4 capsule 5   No current facility-administered medications on file prior to visit.     PAST SURGICAL HISTORY Past Surgical History  Procedure Laterality Date  . Vesicovaginal fistula closure w/ tah  2009  . Abdominal hysterectomy    . Left heart catheterization with coronary angiogram N/A 03/17/2013    Procedure: LEFT HEART CATHETERIZATION WITH CORONARY ANGIOGRAM;  Surgeon: Peter M Martinique, MD;  Location: [facility] CATH LAB;  Service: Cardiovascular;  Laterality: N/A;    FAMILY HISTORY: Family History  Problem Relation Age of Onset  . COPD Father   . Hypertension Father   . Arthritis Brother     SOCIAL HISTORY:  reports that she has been smoking Cigarettes.  She started smoking about 28 years ago. She has a 17.5 pack-year smoking history. She has never used smokeless tobacco. She reports that she does not drink alcohol or use illicit drugs.  She has a boyfriend of 9 years.  She has 1 daughter and 1 son, both are alive and well.  She has 1 grandchild.  Her mother is alive and in her 78's.  She has not seen or spoken to her mother in 54 years.  Her father is 60's and he is estranged as well, but she reports he has HTN.  PERFORMANCE STATUS: The patient's performance status is 1 - Symptomatic but completely ambulatory   PHYSICAL EXAM: Most Recent Vital Signs: Blood pressure 127/71, pulse 89,  temperature 97.7 F (36.5 C), temperature source Oral, resp. rate 16, weight 177 lb (80.287 kg), SpO2 98 %. General appearance: alert, cooperative, appears stated age, no distress, moderately obese and chronically ill appearing Head: Normocephalic, without obvious abnormality, atraumatic Eyes: negative findings: lids and lashes normal, conjunctivae and sclerae normal, corneas clear and pupils equal, round, reactive to light and accomodation Throat: normal findings: lips normal without lesions and buccal mucosa normal Neck: no adenopathy and supple, symmetrical, trachea midline Lungs: diminished breath sounds bilaterally Heart: regular rate and rhythm,  S1, S2 normal, no murmur, click, rub or gallop Abdomen: normal findings: no masses palpable and soft and abnormal findings:  abnormally marked with light palpation tenderness in the RUQ Extremities:WNL no bruising evident on exam today Pulses: 2+ and symmetric Skin: normal, mobility and turgor normal, nails clear without discoloration or sign of infection and no edema Lymph nodes: Cervical, supraclavicular, and axillary nodes normal. Neurologic: Alert and oriented X 3, normal strength and tone. Normal symmetric reflexes. Normal coordination and gait  LABORATORY DATA:   Results for SANYAH, MOLNAR (MRN [id number])   Ref. Range 10/28/2014 09:19  WBC Latest Ref Range: 4.0-10.5 K/uL 11.1 (H)  RBC Latest Ref Range: 3.87-5.11 MIL/uL 4.48  Hemoglobin Latest Ref Range: 12.0-15.0 g/dL 14.5  HCT Latest Ref Range: 36.0-46.0 % 42.4  MCV Latest Ref Range: 78.0-100.0 fL 94.6  MCH Latest Ref Range: 26.0-34.0 pg 32.4  MCHC Latest Ref Range: 30.0-36.0 g/dL 34.2  RDW Latest Ref Range: 11.5-15.5 % 13.1  Platelets Latest Ref Range: 150-400 K/uL 342  Neutrophils Latest Ref Range: 43-77 % 60  Lymphocytes Latest Ref Range: 12-46 % 31  Monocytes Relative Latest Ref Range: 3-12 % 7  Eosinophil Latest Ref Range: 0-5 % 2  Basophil Latest Ref Range: 0-1 % 0  NEUT# Latest  Ref Range: 1.7-7.7 K/uL 6.6  Lymphocyte # Latest Ref Range: 0.7-4.0 K/uL 3.5  Monocyte # Latest Ref Range: 0.1-1.0 K/uL 0.7  Eosinophils Absolute Latest Ref Range: 0.0-0.7 K/uL 0.3  Basophils Absolute Latest Ref Range: 0.0-0.1 K/uL 0.1  Prothrombin Time Latest Ref Range: 11.6-15.2 seconds 14.3  INR Latest Ref Range: 0.00-1.49  1.09  APTT Latest Ref Range: 24-37 seconds 34      14mo ago    Miscellaneous Test VITAMIN K1VC   Comments: CORRECTED ON 07/12 AT 7416: PREVIOUSLY REPORTED AS VITAMIN K1 TEST CODE 121200 TO [facility]   Miscellaneous Test Results (NOTE)   Comments: TEST           RESULT       REFERENCE INTERVAL  ======================================================================  VITAMIN K1 :       0.18        0.13-1.88 ng/mL  Performed at [facility]      Specimen Collected: 10/28/14 9:25 AM Last Resulted: 11/02/14 8:42 AM             VC=Value has a corrected status         PFA Interpretation   Platelet function assay       Collected: 10/28/14 0919   Resulting lab: [facility]   Value:      Comment: This pattern is indicative of  drug induced platelet dysfunction.  Most commonly seen after  aspirin ingestion.      Results of the test should always be  interpreted in conjunction with the  patient's medical history, clinical  presentation and medication history.  Patients with Hematocrit values <35.0%  or Platelet counts <150,000/uL may result  in values above the Laboratory established  reference range.     *Additional information available - comment     Platelet function assay (Order [id number])      Platelet function assay  Status: Finalresult Visible to patient:  Not Released Nextappt: 12/21/2014 at 10:00 AM in [facility] (Tula Nakayama, MD) Dx:  Bruising            Newer results are available. Click to view them now.        Ref  Range  3mo ago    Collagen / ADP 0 - 118 seconds 73   PFA Interpretation     Comments: This pattern is indicative of  drug induced platelet dysfunction.  Most commonly seen after  aspirin ingestion.      Results of the test should always be  interpreted in conjunction with the  patient's medical history, clinical  presentation and medication history.  Patients with Hematocrit values <35.0%  or Platelet counts <150,000/uL may result  in values above the Laboratory established  reference range.     Collagen / Epinephrine 0 - 193 seconds >300 (H)   Resulting Agency [facility]       Specimen Collected: 10/28/14 9:19 AM Last Resulted: 10/28/14 10:44 AM                       ASSESSMENT/PLAN:  Easy Bruising, now resolved Abnormal PFA with results c/w drug induced platelet dysfunction  I discussed with the patient the results of her testing. The only abnormal result is consistent with drug-induced platelet dysfunction. I advised her I would like to repeat this test today in addition obtain other studies. She denies any use of aspirin or other NSAIDS. I need to look into her other medications and see if they can affect the PFA.  I have advised her to continue to watch for abnormal bruising I will bring her back again after the results of her laboratory studies we obtained today are back in. If she has any interim problems with bleeding such as nose bleeding or gum bleeding and encouraged her to call us and we will bring her in sooner than the scheduled appointment. Additional recommendations will follow.  Orders Placed This Encounter  Procedures  . Platelet function assay  . Salicylate level  . Miscellaneous test    [id number], serum/plasma ibuprofen level -- red top    All questions were answered. The patient knows to call the clinic with any problems, questions or concerns. We can certainly see the patient much sooner if necessary.   This document serves as a record of  services personally performed by Ancil Linsey, MD. It was created on her behalf by Janace Hoard, a trained medical scribe. The creation of this record is based on the scribe's personal observations and the provider's statements to them. This document has been checked and approved by the attending provider.  I have reviewed the above documentation for accuracy and completeness, and I agree with the above.  This note was electronically signed.  Kelby Fam. Whitney Muse, MD

## 2014-11-12 NOTE — Patient Instructions (Signed)
[place] at [facility] Discharge Instructions  RECOMMENDATIONS MADE BY THE CONSULTANT AND ANY TEST RESULTS WILL BE SENT TO YOUR REFERRING PHYSICIAN.  Exam and discussion by Dr. Whitney Muse Will check some additional labs today. Will see you back in 2 weeks to discuss test results.  Thank you for choosing [place] at [facility] to provide your oncology and hematology care.  To afford each patient quality time with our provider, please arrive at least 15 minutes before your scheduled appointment time.    You need to re-schedule your appointment should you arrive 10 or more minutes late.  We strive to give you quality time with our providers, and arriving late affects you and other patients whose appointments are after yours.  Also, if you no show three or more times for appointments you may be dismissed from the clinic at the providers discretion.     Again, thank you for choosing [facility].  Our hope is that these requests will decrease the amount of time that you wait before being seen by our physicians.       _____________________________________________________________  Should you have questions after your visit to [facility], please contact our office at [phone] between the hours of 8:30 a.m. and 4:30 p.m.  Voicemails left after 4:30 p.m. will not be returned until the following business day.  For prescription refill requests, have your pharmacy contact our office.

## 2014-11-14 NOTE — Assessment & Plan Note (Addendum)
Uncontrolled.Toradol  administered IM in the office, surgery anticipated in the Summer , but needs to be nicotine free prior to surgery, she is working at this

## 2014-11-14 NOTE — Assessment & Plan Note (Addendum)
Unchnaged. Patient re-educated about  the importance of commitment to a  minimum of 150 minutes of exercise per week.  The importance of healthy food choices with portion control discussed. Encouraged to start a food diary, count calories and to consider  joining a support group. Sample diet sheets offered. Goals set by the patient for the next several months.   Weight /BMI 11/12/2014 10/22/2014 10/13/2014  WEIGHT 177 lb 174 lb 14.4 oz 174 lb  HEIGHT - 5' 2.5" 5\' 3"   BMI 31.84 kg/m2 31.46 kg/m2 30.83 kg/m2    Current exercise per week 60 minutes.

## 2014-11-14 NOTE — Assessment & Plan Note (Signed)
Refer to hematology for eval

## 2014-11-14 NOTE — Assessment & Plan Note (Signed)
Controlled, no change in medication DASH diet and commitment to daily physical activity for a minimum of 30 minutes discussed and encouraged, as a part of hypertension management. The importance of attaining a healthy weight is also discussed.  BP/Weight 11/12/2014 10/22/2014 10/13/2014 08/19/2014 06/16/2014 07/24/3534 04/26/4313  Systolic BP 400 867 619 509 326 712 458  Diastolic BP 71 78 84 86 81 76 88  Wt. (Lbs) 177 174.9 174 188.08 180 182 182  BMI 31.84 31.46 30.83 33.33 31.89 32.25 32.25

## 2014-11-14 NOTE — Assessment & Plan Note (Signed)
Chronic complaint, zofran administered Im due to acute flare

## 2014-11-14 NOTE — Progress Notes (Signed)
Brooke Maddox     MRN: [id number]      DOB: March 17, 1971   HPI Brooke Maddox is here  With main c/o concern about generalized bruising which she has noted for som time but seems to be worsening. Follow up and re-evaluation of chronic medical conditions, medication management and review of any available recent lab and radiology data.  Preventive health is updated, specifically  Cancer screening and Immunization.   Questions or concerns regarding consultations or procedures which the PT has had in the interim are  addressed. The PT denies any adverse reactions to current medications since the last visit.  C/o uncontrolled and increased nausea and back pain  In the past 2 toi 3 days, requests injections at visit for these   ROS Denies recent fever or chills. Denies sinus pressure, nasal congestion, ear pain or sore throat. Denies chest congestion, productive cough or wheezing. Denies chest pains, palpitations and leg swelling Denies abdominal pain, , vomiting,diarrhea or constipation.   Denies dysuria, frequency, hesitancy or incontinence. Denies headaches, seizures, numbness, or tingling. Denies uncontrolled  depression, anxiety or insomnia. Denies skin break down or rash.   PE  BP 130/84 mmHg  Pulse 76  Resp 16  Ht 5\' 3"  (1.6 m)  Wt 174 lb (78.926 kg)  BMI 30.83 kg/m2  SpO2 97%  Patient alert and oriented and in no cardiopulmonary distress.  HEENT: No facial asymmetry, EOMI,   oropharynx pink and moist.  Neck supple no JVD, no mass.  Chest: Clear to auscultation bilaterally.decreased air entry  CVS: S1, S2 no murmurs, no S3.Regular rate.  ABD: Soft non tender.   Ext: No edema  MS: decreased ROM spine, adequate in shoulders, hips and knees.  Skin: Intact, no ulcerations or rash noted.Bruises noted on upper extremities  Psych: Good eye contact, normal affect. Memory intact not anxious or depressed appearing.  CNS: CN 2-12 intact, power,  normal throughout.no focal deficits  noted.   Assessment & Plan   NICOTINE ADDICTION Patient counseled for approximately 5 minutes regarding the health risks of ongoing nicotine use, specifically all types of cancer, heart disease, stroke and respiratory failure. The options available for help with cessation ,the behavioral changes to assist the process, and the option to either gradully reduce usage  Or abruptly stop.is also discussed. Pt is also encouraged to set specific goals in number of cigarettes used daily, as well as to set a quit date.  Number of cigarettes/cigars currently smoking daily: 10   Back pain with radiation Uncontrolled.Toradol  administered IM in the office, surgery anticipated in the Summer , but needs to be nicotine free prior to surgery, she is working at this  Nausea without vomiting Chronic complaint, zofran administered Im due to acute flare  Bruising Refer to hematology for eval  Obesity (BMI 30.0-34.9) Unchnaged. Patient re-educated about  the importance of commitment to a  minimum of 150 minutes of exercise per week.  The importance of healthy food choices with portion control discussed. Encouraged to start a food diary, count calories and to consider  joining a support group. Sample diet sheets offered. Goals set by the patient for the next several months.   Weight /BMI 11/12/2014 10/22/2014 10/13/2014  WEIGHT 177 lb 174 lb 14.4 oz 174 lb  HEIGHT - 5' 2.5" 5\' 3"   BMI 31.84 kg/m2 31.46 kg/m2 30.83 kg/m2    Current exercise per week 60 minutes.   Essential hypertension Controlled, no change in medication DASH diet and commitment to  daily physical activity for a minimum of 30 minutes discussed and encouraged, as a part of hypertension management. The importance of attaining a healthy weight is also discussed.  BP/Weight 11/12/2014 10/22/2014 10/13/2014 08/19/2014 06/16/2014 05/31/3149 10/26/1605  Systolic BP 371 062 694 854 627 035 009  Diastolic BP 71 78 84 86 81 76 88  Wt. (Lbs) 177 174.9  174 188.08 180 182 182  BMI 31.84 31.46 30.83 33.33 31.89 32.25 32.25

## 2014-11-18 LAB — MISCELLANEOUS TEST

## 2014-11-26 ENCOUNTER — Encounter (HOSPITAL_COMMUNITY): Payer: Medicare Other | Attending: Oncology | Admitting: Hematology & Oncology

## 2014-11-26 ENCOUNTER — Encounter (HOSPITAL_COMMUNITY): Payer: Self-pay | Admitting: Hematology & Oncology

## 2014-11-26 VITALS — BP 128/96 | HR 70 | Temp 98.0°F | Resp 20 | Wt 165.0 lb

## 2014-11-26 DIAGNOSIS — F418 Other specified anxiety disorders: Secondary | ICD-10-CM | POA: Insufficient documentation

## 2014-11-26 DIAGNOSIS — D72829 Elevated white blood cell count, unspecified: Secondary | ICD-10-CM | POA: Insufficient documentation

## 2014-11-26 DIAGNOSIS — Z720 Tobacco use: Secondary | ICD-10-CM

## 2014-11-26 DIAGNOSIS — R51 Headache: Secondary | ICD-10-CM | POA: Insufficient documentation

## 2014-11-26 DIAGNOSIS — F3189 Other bipolar disorder: Secondary | ICD-10-CM | POA: Diagnosis not present

## 2014-11-26 DIAGNOSIS — R569 Unspecified convulsions: Secondary | ICD-10-CM | POA: Insufficient documentation

## 2014-11-26 DIAGNOSIS — R112 Nausea with vomiting, unspecified: Secondary | ICD-10-CM | POA: Diagnosis not present

## 2014-11-26 DIAGNOSIS — K219 Gastro-esophageal reflux disease without esophagitis: Secondary | ICD-10-CM | POA: Insufficient documentation

## 2014-11-26 DIAGNOSIS — F17210 Nicotine dependence, cigarettes, uncomplicated: Secondary | ICD-10-CM | POA: Insufficient documentation

## 2014-11-26 DIAGNOSIS — F319 Bipolar disorder, unspecified: Secondary | ICD-10-CM | POA: Insufficient documentation

## 2014-11-26 DIAGNOSIS — J449 Chronic obstructive pulmonary disease, unspecified: Secondary | ICD-10-CM | POA: Insufficient documentation

## 2014-11-26 DIAGNOSIS — T148XXA Other injury of unspecified body region, initial encounter: Secondary | ICD-10-CM

## 2014-11-26 DIAGNOSIS — G40909 Epilepsy, unspecified, not intractable, without status epilepticus: Secondary | ICD-10-CM | POA: Insufficient documentation

## 2014-11-26 DIAGNOSIS — M1990 Unspecified osteoarthritis, unspecified site: Secondary | ICD-10-CM | POA: Insufficient documentation

## 2014-11-26 DIAGNOSIS — T148 Other injury of unspecified body region: Secondary | ICD-10-CM | POA: Insufficient documentation

## 2014-11-26 DIAGNOSIS — I10 Essential (primary) hypertension: Secondary | ICD-10-CM | POA: Insufficient documentation

## 2014-11-26 DIAGNOSIS — R233 Spontaneous ecchymoses: Secondary | ICD-10-CM | POA: Diagnosis not present

## 2014-11-26 DIAGNOSIS — E785 Hyperlipidemia, unspecified: Secondary | ICD-10-CM | POA: Insufficient documentation

## 2014-11-26 DIAGNOSIS — M549 Dorsalgia, unspecified: Secondary | ICD-10-CM | POA: Insufficient documentation

## 2014-11-26 DIAGNOSIS — Z79899 Other long term (current) drug therapy: Secondary | ICD-10-CM | POA: Insufficient documentation

## 2014-11-26 DIAGNOSIS — E669 Obesity, unspecified: Secondary | ICD-10-CM | POA: Insufficient documentation

## 2014-11-26 NOTE — Progress Notes (Signed)
[facility] Hematology/Oncology Consultation   Name: Brooke Maddox      MRN: [id number]    Location: Room/bed info not found  Date: 11/26/2014 Time:1:41 PM   REFERRING PHYSICIAN:  Tula Nakayama, MD  REASON FOR CONSULT:  Easy bruising   DIAGNOSIS:  Easy bruising with a mild leukocytosis dating back to at least May 2010  HISTORY OF PRESENT ILLNESS:   Brooke Maddox is a 44 year old white American woman with a past medical history significant for seizure disorder, osteoarthritis, obesity, tobacco abuse, hyperlipidemia, headaches, GERD, HTN, COPD, chronic back pain, and Bipolar affective disorder who is referred to the [facility] for easy bruising in the setting of a normal platelet count and Hgb with a mild leukocytosis that has been present and stable since at least May 2010.  Chart is reviewed.  Platelet function assay continues to show greater than 300 seconds on the collagen/epinephrine cartridge and normal values on the collagen/ADP. This is consistent with drug-induced platelet dysfunction. Baby aspirin is listed on her meds but she notes she does not take it.  The PFA-100 has been repeated twice. Vitamin K level, PTT,PT salicylate level, ibuprofen level have all been WNL. Her bruises resolved after her initial visit and she has had no additional bruising since.    The patient has been extremely nauseated from her medications.   She sometimes has to pull over while driving to vomit.  This has been going on for 2 weeks, she notes that she has lost weight over the past few weeks.   PAST MEDICAL HISTORY:   Past Medical History  Diagnosis Date  . Hypertension   . Arthritis   . Depression   . Anxiety associated with depression 2007    hospitalised for mental health problems   . Seizures     Dr. Theda Sers   . Migraines   . Chronic back pain   . History of seizure disorder   . History of leukocytosis   . Bipolar affective disorder   . Osteoarthritis     mild   . Abdominal pain,  acute, left lower quadrant   . Chronic constipation   . Nausea   . Nicotine addiction   . Obesity   . Fatigue   . Acute knee pain     right   . Hyperlipidemia   . COPD (chronic obstructive pulmonary disease)   . Collagen vascular disease   . Renal insufficiency   . Lumbar radiculopathy     left  . Chronic leg pain     left  . Pain management   . Leukocytosis 10/22/2014    ALLERGIES: Allergies  Allergen Reactions  . Dilaudid [Hydromorphone]     Gives patient a huge headache  . Lithium Carbonate Other (See Comments)    Shakes   . Morphine And Related Other (See Comments)    Extended Drowsiness.   . Sulfur     Rash   . Tylenol [Acetaminophen]     Pt states that regular tylenol makes her sick but she can take tylenol when mixed with another medication such as percocet or Vicodin she does not get sick,   . Wellbutrin [Bupropion] Other (See Comments)    H/o seizure d/o in medical record   . Codeine Nausea And Vomiting  . Divalproex Sodium Rash  . Fentanyl Rash  . Iodine Rash  . Penicillins Nausea And Vomiting and Rash      MEDICATIONS: I have reviewed  the patient's current medications.    Current Outpatient Prescriptions on File Prior to Visit  Medication Sig Dispense Refill  . ALPRAZolam (XANAX) 1 MG tablet Take 1 mg by mouth 3 (three) times daily.     Marland Kitchen aspirin 81 MG chewable tablet Chew 81 mg by mouth.    . beclomethasone (QVAR) 40 MCG/ACT inhaler Inhale 1 puff into the lungs 2 (two) times daily. 1 Inhaler 3  . cloNIDine (CATAPRES) 0.1 MG tablet TAKE 1 TABLET BY MOUTH EVERY NIGHT AT BEDTIME 30 tablet 2  . cyclobenzaprine (FLEXERIL) 10 MG tablet Take 1 tablet (10 mg total) by mouth 3 (three) times daily as needed for muscle spasms. (Patient not taking: Reported on 11/12/2014) 20 tablet 0  . FLUoxetine (PROZAC) 40 MG capsule Take 40 mg by mouth every morning.    . gabapentin (NEURONTIN) 300 MG capsule Take 600 mg by mouth.    . lamoTRIgine (LAMICTAL) 150 MG tablet Take  300 mg by mouth every morning.     Marland Kitchen levothyroxine (SYNTHROID, LEVOTHROID) 25 MCG tablet TAKE 1 TABLET BY MOUTH EVERY DAY 30 tablet 2  . metoCLOPramide (REGLAN) 10 MG tablet Take 10 mg by mouth.    . nitroGLYCERIN (NITROSTAT) 0.4 MG SL tablet Place 1 tablet (0.4 mg total) under the tongue every 5 (five) minutes as needed for chest pain. 25 tablet 3  . potassium chloride SA (K-DUR,KLOR-CON) 20 MEQ tablet Take by mouth.    . rizatriptan (MAXALT-MLT) 5 MG disintegrating tablet Take 5 mg by mouth as needed for migraine. May repeat in 2 hours if needed    . rosuvastatin (CRESTOR) 20 MG tablet TAKE 1 TABLET BY MOUTH EVERY NIGHT AT BEDTIME 30 tablet 2  . tiotropium (SPIRIVA) 18 MCG inhalation capsule Place into inhaler and inhale.    . Vitamin D, Ergocalciferol, (DRISDOL) 50000 UNITS CAPS capsule Take 1 capsule (50,000 Units total) by mouth every 7 (seven) days. (Patient not taking: Reported on 11/12/2014) 4 capsule 5   No current facility-administered medications on file prior to visit.     PAST SURGICAL HISTORY Past Surgical History  Procedure Laterality Date  . Vesicovaginal fistula closure w/ tah  2009  . Abdominal hysterectomy    . Left heart catheterization with coronary angiogram N/A 03/17/2013    Procedure: LEFT HEART CATHETERIZATION WITH CORONARY ANGIOGRAM;  Surgeon: Peter M Martinique, MD;  Location: [facility] CATH LAB;  Service: Cardiovascular;  Laterality: N/A;    FAMILY HISTORY: Family History  Problem Relation Age of Onset  . COPD Father   . Hypertension Father   . Arthritis Brother     SOCIAL HISTORY:  reports that she has been smoking Cigarettes.  She started smoking about 28 years ago. She has a 17.5 pack-year smoking history. She has never used smokeless tobacco. She reports that she does not drink alcohol or use illicit drugs.  She has a boyfriend of 9 years.  She has 1 daughter and 1 son, both are alive and well.  She has 1 grandchild.  Her mother is alive and in her 40's.  She has  not seen or spoken to her mother in 40 years.  Her father is 37's and he is estranged as well, but she reports he has HTN.  PERFORMANCE STATUS: The patient's performance status is 1 - Symptomatic but completely ambulatory  PHYSICAL EXAM: Most Recent Vital Signs: Blood pressure 128/96, pulse 70, temperature 98 F (36.7 C), temperature source Oral, resp. rate 20, weight 165 lb (74.844 kg), SpO2 98 %.  General appearance: alert, cooperative, appears stated age, no distress, moderately obese  Head: Normocephalic, without obvious abnormality, atraumatic Eyes: negative findings: lids and lashes normal, conjunctivae and sclerae normal, corneas clear and pupils equal, round, reactive to light and accomodation Throat: normal findings: lips normal without lesions and buccal mucosa normal Neck: no adenopathy and supple, symmetrical, trachea midline Lungs: diminished breath sounds bilaterally Heart: regular rate and rhythm, S1, S2 normal, no murmur, click, rub or gallop Abdomen: normal findings: no masses palpable and soft and abnormal findings:  abnormally marked with light palpation tenderness in the RUQ Extremities: Homans sign is negative, no sign of DVT  Pulses: 2+ and symmetric Skin: normal, mobility and turgor normal, nails clear without discoloration or sign of infection and no edema Left upper thigh, distal to groin bruising resolved Right upper thigh, distal to groin bruising resolved Right popliteal area bruising resolved Left thigh bruising resolved Lymph nodes: Cervical, supraclavicular, and axillary nodes normal. Neurologic: Alert and oriented X 3, normal strength and tone. Normal symmetric reflexes. Normal coordination and gait  LABORATORY DATA:  CBC    Component Value Date/Time   WBC 11.1* 10/28/2014 0919   RBC 4.48 10/28/2014 0919   HGB 14.5 10/28/2014 0919   HCT 42.4 10/28/2014 0919   PLT 342 10/28/2014 0919   MCV 94.6 10/28/2014 0919   MCH 32.4 10/28/2014 0919   MCHC 34.2  10/28/2014 0919   RDW 13.1 10/28/2014 0919   LYMPHSABS 3.5 10/28/2014 0919   MONOABS 0.7 10/28/2014 0919   EOSABS 0.3 10/28/2014 0919   BASOSABS 0.1 10/28/2014 0919      Chemistry      Component Value Date/Time   NA 135 08/19/2014 1119   K 4.4 08/19/2014 1119   CL 98 08/19/2014 1119   CO2 26 08/19/2014 1119   BUN 13 08/19/2014 1119   CREATININE 0.80 08/19/2014 1119   CREATININE 0.74 11/21/2013 0008      Component Value Date/Time   CALCIUM 9.4 08/19/2014 1119   ALKPHOS 109 08/19/2014 1119   AST 14 08/19/2014 1119   ALT 13 08/19/2014 1119   BILITOT 0.2 08/19/2014 1119      ASSESSMENT/PLAN:  Easy Bruising Abnormal PFA-100 with results c/w drug effect Leukocytosis with normal peripheral flow cytometry  She has had no further bruising since her initial office visit with [place]. The pattern of her PFA-100 continues to be consistent with drug-induced platelet dysfunction. An ibuprofen level and salicylate level were negative. Baby aspirin is on her medication list but she notes she is not taking this. She denies any other form of over-the-counter medication or supplements.  I am going to try to look into possible other explanation for abnormal PFA-100 test. She currently has no further problems with bruising. I advised the patient I will discharge her back to [name]. I am also going to consider possible referral for additional valuation of her abnormal PFA-100.  Peripheral flow cytometry was sent for her persistent leukocytosis and was unremarkable for any monoclonal population.  Going to refer her to [name] for her ongoing problems with nausea and vomiting. She has an upcoming appointment with her but not for several weeks and we will see if we can get her in sooner.  All questions were answered. The patient knows to call the clinic with any problems, questions or concerns. We can certainly see the patient much sooner if necessary.   This document serves as a record of  services personally performed by Ancil Linsey, MD. It was  created on her behalf by Janace Hoard, a trained medical scribe. The creation of this record is based on the scribe's personal observations and the provider's statements to them. This document has been checked and approved by the attending provider.  I have reviewed the above documentation for accuracy and completeness, and I agree with the above.  This note was electronically signed.  Kelby Fam. Whitney Muse, MD

## 2014-11-26 NOTE — Patient Instructions (Addendum)
[place] at [facility] Discharge Instructions  RECOMMENDATIONS MADE BY THE CONSULTANT AND ANY TEST RESULTS WILL BE SENT TO YOUR REFERRING PHYSICIAN.  Exam and discussion today with Dr. Whitney Muse. We will contact you if any further appointments are needed with this clinic. Follow up as scheduled with [name] on 11/23/14.  Thank you for choosing [place] at [facility] to provide your oncology and hematology care.  To afford each patient quality time with our provider, please arrive at least 15 minutes before your scheduled appointment time.    You need to re-schedule your appointment should you arrive 10 or more minutes late.  We strive to give you quality time with our providers, and arriving late affects you and other patients whose appointments are after yours.  Also, if you no show three or more times for appointments you may be dismissed from the clinic at the providers discretion.     Again, thank you for choosing [facility]  Our hope is that these requests will decrease the amount of time that you wait before being seen by our physicians.       _____________________________________________________________  Should you have questions after your visit to [facility], please contact our office at [phone] between the hours of 8:30 a.m. and 4:30 p.m.  Voicemails left after 4:30 p.m. will not be returned until the following business day.  For prescription refill requests, have your pharmacy contact our office.

## 2014-11-29 ENCOUNTER — Encounter: Payer: Self-pay | Admitting: Nutrition

## 2014-11-29 ENCOUNTER — Telehealth: Payer: Self-pay | Admitting: Nutrition

## 2014-11-29 NOTE — Telephone Encounter (Signed)
VM not set up. PC

## 2014-11-30 ENCOUNTER — Encounter: Payer: Self-pay | Admitting: Family Medicine

## 2014-11-30 ENCOUNTER — Ambulatory Visit (INDEPENDENT_AMBULATORY_CARE_PROVIDER_SITE_OTHER): Payer: Medicare Other | Admitting: Family Medicine

## 2014-11-30 VITALS — BP 130/62 | HR 68 | Resp 18 | Ht 63.0 in | Wt 171.0 lb

## 2014-11-30 DIAGNOSIS — G40909 Epilepsy, unspecified, not intractable, without status epilepticus: Secondary | ICD-10-CM

## 2014-11-30 DIAGNOSIS — M549 Dorsalgia, unspecified: Secondary | ICD-10-CM

## 2014-11-30 DIAGNOSIS — I10 Essential (primary) hypertension: Secondary | ICD-10-CM

## 2014-11-30 DIAGNOSIS — R1031 Right lower quadrant pain: Secondary | ICD-10-CM | POA: Diagnosis not present

## 2014-11-30 DIAGNOSIS — Z720 Tobacco use: Secondary | ICD-10-CM | POA: Diagnosis not present

## 2014-11-30 DIAGNOSIS — R1011 Right upper quadrant pain: Secondary | ICD-10-CM | POA: Diagnosis not present

## 2014-11-30 DIAGNOSIS — F17210 Nicotine dependence, cigarettes, uncomplicated: Secondary | ICD-10-CM | POA: Diagnosis not present

## 2014-11-30 DIAGNOSIS — R112 Nausea with vomiting, unspecified: Secondary | ICD-10-CM | POA: Insufficient documentation

## 2014-11-30 DIAGNOSIS — F17200 Nicotine dependence, unspecified, uncomplicated: Secondary | ICD-10-CM

## 2014-11-30 LAB — LIPASE: Lipase: 19 U/L (ref 7–60)

## 2014-11-30 LAB — CBC WITH DIFFERENTIAL/PLATELET
Basophils Absolute: 0.1 10*3/uL (ref 0.0–0.1)
Basophils Relative: 1 % (ref 0–1)
Eosinophils Absolute: 0.7 10*3/uL (ref 0.0–0.7)
Eosinophils Relative: 8 % — ABNORMAL HIGH (ref 0–5)
HCT: 38 % (ref 36.0–46.0)
Hemoglobin: 13.1 g/dL (ref 12.0–15.0)
Lymphocytes Relative: 39 % (ref 12–46)
Lymphs Abs: 3.5 10*3/uL (ref 0.7–4.0)
MCH: 32 pg (ref 26.0–34.0)
MCHC: 34.5 g/dL (ref 30.0–36.0)
MCV: 92.7 fL (ref 78.0–100.0)
MPV: 10.7 fL (ref 8.6–12.4)
Monocytes Absolute: 0.5 10*3/uL (ref 0.1–1.0)
Monocytes Relative: 6 % (ref 3–12)
Neutro Abs: 4.1 10*3/uL (ref 1.7–7.7)
Neutrophils Relative %: 46 % (ref 43–77)
Platelets: 346 10*3/uL (ref 150–400)
RBC: 4.1 MIL/uL (ref 3.87–5.11)
RDW: 13.4 % (ref 11.5–15.5)
WBC: 9 10*3/uL (ref 4.0–10.5)

## 2014-11-30 LAB — COMPREHENSIVE METABOLIC PANEL
ALT: 8 U/L (ref 6–29)
AST: 14 U/L (ref 10–30)
Albumin: 3.8 g/dL (ref 3.6–5.1)
Alkaline Phosphatase: 93 U/L (ref 33–115)
BUN: 8 mg/dL (ref 7–25)
CO2: 32 mmol/L — ABNORMAL HIGH (ref 20–31)
Calcium: 9.6 mg/dL (ref 8.6–10.2)
Chloride: 102 mmol/L (ref 98–110)
Creat: 0.68 mg/dL (ref 0.50–1.10)
Glucose, Bld: 95 mg/dL (ref 65–99)
Potassium: 3.9 mmol/L (ref 3.5–5.3)
Sodium: 147 mmol/L — ABNORMAL HIGH (ref 135–146)
Total Bilirubin: 0.3 mg/dL (ref 0.2–1.2)
Total Protein: 6.2 g/dL (ref 6.1–8.1)

## 2014-11-30 LAB — AMYLASE: Amylase: 25 U/L (ref 0–105)

## 2014-11-30 MED ORDER — KETOROLAC TROMETHAMINE 60 MG/2ML IM SOLN
60.0000 mg | Freq: Once | INTRAMUSCULAR | Status: AC
  Administered 2014-11-30: 60 mg via INTRAMUSCULAR

## 2014-11-30 MED ORDER — ONDANSETRON HCL 4 MG PO TABS: 4.0000 mg | ORAL_TABLET | Freq: Three times a day (TID) | ORAL | Status: DC | PRN

## 2014-11-30 MED ORDER — ONDANSETRON HCL 4 MG/2ML IJ SOLN
4.0000 mg | Freq: Once | INTRAMUSCULAR | Status: AC
  Administered 2014-11-30: 4 mg via INTRAMUSCULAR

## 2014-11-30 NOTE — Progress Notes (Signed)
   Subjective:    Patient ID: Brooke Maddox, female    DOB: Sep 18, 1970, 44 y.o.   MRN: [id number]  HPI  2 week h/o recurrent RUQ pain with nausea, vomit daily , pain radiaiting to back, chills no fever  Has had 2 seizures in the past 2 weeks, states under stress Has h/o seizures none in years Review of Systems See HPI Denies recent fever or chills. Denies sinus pressure, nasal congestion, ear pain or sore throat. Denies chest congestion, productive cough or wheezing. Denies chest pains, palpitations and leg swelling Denies diarrhea or constipation.   Denies dysuria, frequency, hesitancy or incontinence. Chronic joint pain, swelling and limitation in mobility.   Denies uncontrolled  depression c/o increased  anxiety or insomnia. Denies skin break down or rash.         Objective:   Physical Exam  BP 130/62 mmHg  Pulse 68  Resp 18  Ht 5\' 3"  (1.6 m)  Wt 171 lb (77.565 kg)  BMI 30.30 kg/m2  SpO2 98% Patient alert and oriented and in no cardiopulmonary distress.  HEENT: No facial asymmetry, EOMI,   oropharynx pink and moist.  Neck supple no JVD, no mass.  Chest: Clear to auscultation bilaterally.  CVS: S1, S2 no murmur no S3.  IHK:VQQV epigastric and RUQ tenderness, no guarding or rebound, no palpable mass or organomegaly, normal BS. Lower abdominal tenderness present Ext: No edema  MS: Adequate though reduced  ROM spine, normal in shoulders, hips and knees.  Skin: Intact, no ulcerations or rash noted.  Psych: Good eye contact, normal affect. Memory intact not anxious or depressed appearing.  CNS: CN 2-12 intact, power,  normal throughout.no focal deficits noted.       Assessment & Plan:  Continuous RUQ abdominal pain 2 week h/o colicky RUQ pain radiaiting to back, nausea and vomit, needs studies of gall bladder evaluate for stone and function  Seizure disorder Reports 2 seizures in past 2 weeks, needs neuro eval, past h/o seizures but none in over 3 years  RLQ  abdominal pain CT abdomen to further evaluiate, exam is abnormal  Nausea and vomiting Recurrent nausea and vomit, zofran in office and medication prescribed also  Essential hypertension Controlled, no change in medication DASH diet and commitment to daily physical activity for a minimum of 30 minutes discussed and encouraged, as a part of hypertension management. The importance of attaining a healthy weight is also discussed.  BP/Weight 12/13/2014 12/06/2014 11/30/2014 11/26/2014 11/12/2014 10/22/2014 9/56/3875  Systolic BP 99 643 329 518 841 660 630  Diastolic BP 59 87 62 96 71 78 84  Wt. (Lbs) 145 145 171 165 177 174.9 174  BMI 25.69 25.69 30.3 29.68 31.84 31.46 30.83        Back pain with radiation Chronic with current flare, toradol in office  NICOTINE ADDICTION Patient counseled for approximately 5 minutes regarding the health risks of ongoing nicotine use, specifically all types of cancer, heart disease, stroke and respiratory failure. The options available for help with cessation ,the behavioral changes to assist the process, and the option to either gradully reduce usage  Or abruptly stop.is also discussed. Pt is also encouraged to set specific goals in number of cigarettes used daily, as well as to set a quit date.  Number of cigarettes/cigars currently smoking daily:3 to 5

## 2014-11-30 NOTE — Assessment & Plan Note (Addendum)
Reports 2 seizures in past 2 weeks, needs neuro eval, past h/o seizures but none in over 3 years

## 2014-11-30 NOTE — Patient Instructions (Signed)
F/u as before  You are referred  For [place] of abdomen and a scan if this does not show cause of your symptoms Limit intake to clear liquids until we know more   Zofran in office for nausea and medication sent in also, and toradol in office for pain  You are referred to [facility] [place] for new seizure activity  Labs today CBC and diff, cmp , amylase and lipase stat

## 2014-11-30 NOTE — Assessment & Plan Note (Addendum)
2 week h/o colicky RUQ pain radiaiting to back, nausea and vomit, needs studies of gall bladder evaluate for stone and function

## 2014-12-01 ENCOUNTER — Other Ambulatory Visit: Payer: Self-pay

## 2014-12-01 ENCOUNTER — Ambulatory Visit (HOSPITAL_COMMUNITY): Admission: RE | Admit: 2014-12-01 | Payer: Medicare Other | Source: Ambulatory Visit

## 2014-12-01 ENCOUNTER — Telehealth: Payer: Self-pay | Admitting: Family Medicine

## 2014-12-01 MED ORDER — PROMETHAZINE HCL 25 MG PO TABS: 25.0000 mg | ORAL_TABLET | Freq: Three times a day (TID) | ORAL | Status: DC | PRN

## 2014-12-01 NOTE — Telephone Encounter (Signed)
pls d/c zofran and enter as "allergy" send in phenergan suppos since vomiting 25 mg pr tid/prn #15 only, if refuses supopos send tabs instead please I will sign

## 2014-12-01 NOTE — Telephone Encounter (Signed)
Tried to call patient no answer and couldn't leave a message because the mailbox was full

## 2014-12-01 NOTE — Telephone Encounter (Signed)
Said the zofran has made her vomit all night. She had to reschedule her appt for the [place] until tomorrow and she wants phenergan called in. States it has never done her like this before. Please advise

## 2014-12-01 NOTE — Telephone Encounter (Signed)
Patient aware and med sent in

## 2014-12-01 NOTE — Telephone Encounter (Signed)
Patient is calling stating that the U/S couldn't be done yesterday nor today due to nausea and headache, it has been r/s for tomorrow, but Brooke Maddox states that ondansetron (ZOFRAN) 4 MG tablet  Seems to be making things worse besides better, please advise?

## 2014-12-02 ENCOUNTER — Ambulatory Visit (HOSPITAL_COMMUNITY)
Admission: RE | Admit: 2014-12-02 | Discharge: 2014-12-02 | Disposition: A | Discharge status: Home / Self Care | Payer: Medicare Other | Source: Ambulatory Visit | Attending: Family Medicine | Admitting: Family Medicine

## 2014-12-02 DIAGNOSIS — R1011 Right upper quadrant pain: Secondary | ICD-10-CM | POA: Insufficient documentation

## 2014-12-02 DIAGNOSIS — R110 Nausea: Secondary | ICD-10-CM | POA: Diagnosis not present

## 2014-12-03 ENCOUNTER — Encounter: Payer: Self-pay | Admitting: Hematology & Oncology

## 2014-12-06 ENCOUNTER — Emergency Department (HOSPITAL_COMMUNITY)
Admission: EM | Admit: 2014-12-06 | Discharge: 2014-12-06 | Disposition: A | Discharge status: Home / Self Care | Payer: Medicare Other | Attending: Emergency Medicine | Admitting: Emergency Medicine

## 2014-12-06 ENCOUNTER — Emergency Department (HOSPITAL_COMMUNITY): Payer: Medicare Other

## 2014-12-06 ENCOUNTER — Encounter (HOSPITAL_COMMUNITY): Payer: Self-pay | Admitting: Emergency Medicine

## 2014-12-06 DIAGNOSIS — G8929 Other chronic pain: Secondary | ICD-10-CM | POA: Diagnosis not present

## 2014-12-06 DIAGNOSIS — S6992XA Unspecified injury of left wrist, hand and finger(s), initial encounter: Secondary | ICD-10-CM | POA: Insufficient documentation

## 2014-12-06 DIAGNOSIS — I10 Essential (primary) hypertension: Secondary | ICD-10-CM | POA: Insufficient documentation

## 2014-12-06 DIAGNOSIS — Z862 Personal history of diseases of the blood and blood-forming organs and certain disorders involving the immune mechanism: Secondary | ICD-10-CM | POA: Diagnosis not present

## 2014-12-06 DIAGNOSIS — R109 Unspecified abdominal pain: Secondary | ICD-10-CM | POA: Insufficient documentation

## 2014-12-06 DIAGNOSIS — E785 Hyperlipidemia, unspecified: Secondary | ICD-10-CM | POA: Insufficient documentation

## 2014-12-06 DIAGNOSIS — Z87448 Personal history of other diseases of urinary system: Secondary | ICD-10-CM | POA: Insufficient documentation

## 2014-12-06 DIAGNOSIS — E669 Obesity, unspecified: Secondary | ICD-10-CM | POA: Insufficient documentation

## 2014-12-06 DIAGNOSIS — S301XXA Contusion of abdominal wall, initial encounter: Secondary | ICD-10-CM | POA: Diagnosis not present

## 2014-12-06 DIAGNOSIS — Y92410 Unspecified street and highway as the place of occurrence of the external cause: Secondary | ICD-10-CM | POA: Insufficient documentation

## 2014-12-06 DIAGNOSIS — F319 Bipolar disorder, unspecified: Secondary | ICD-10-CM | POA: Diagnosis not present

## 2014-12-06 DIAGNOSIS — S20212A Contusion of left front wall of thorax, initial encounter: Secondary | ICD-10-CM | POA: Diagnosis not present

## 2014-12-06 DIAGNOSIS — S3991XA Unspecified injury of abdomen, initial encounter: Secondary | ICD-10-CM | POA: Diagnosis not present

## 2014-12-06 DIAGNOSIS — Y9389 Activity, other specified: Secondary | ICD-10-CM | POA: Diagnosis not present

## 2014-12-06 DIAGNOSIS — S99912A Unspecified injury of left ankle, initial encounter: Secondary | ICD-10-CM | POA: Diagnosis not present

## 2014-12-06 DIAGNOSIS — R1030 Lower abdominal pain, unspecified: Secondary | ICD-10-CM | POA: Diagnosis not present

## 2014-12-06 DIAGNOSIS — M542 Cervicalgia: Secondary | ICD-10-CM | POA: Diagnosis not present

## 2014-12-06 DIAGNOSIS — S3993XA Unspecified injury of pelvis, initial encounter: Secondary | ICD-10-CM | POA: Diagnosis not present

## 2014-12-06 DIAGNOSIS — R079 Chest pain, unspecified: Secondary | ICD-10-CM | POA: Diagnosis not present

## 2014-12-06 DIAGNOSIS — N644 Mastodynia: Secondary | ICD-10-CM | POA: Diagnosis not present

## 2014-12-06 DIAGNOSIS — F419 Anxiety disorder, unspecified: Secondary | ICD-10-CM | POA: Insufficient documentation

## 2014-12-06 DIAGNOSIS — S299XXA Unspecified injury of thorax, initial encounter: Secondary | ICD-10-CM | POA: Diagnosis not present

## 2014-12-06 DIAGNOSIS — Z79899 Other long term (current) drug therapy: Secondary | ICD-10-CM | POA: Insufficient documentation

## 2014-12-06 DIAGNOSIS — Z7951 Long term (current) use of inhaled steroids: Secondary | ICD-10-CM | POA: Insufficient documentation

## 2014-12-06 DIAGNOSIS — M7989 Other specified soft tissue disorders: Secondary | ICD-10-CM | POA: Diagnosis not present

## 2014-12-06 DIAGNOSIS — Y998 Other external cause status: Secondary | ICD-10-CM | POA: Insufficient documentation

## 2014-12-06 DIAGNOSIS — Z8739 Personal history of other diseases of the musculoskeletal system and connective tissue: Secondary | ICD-10-CM | POA: Insufficient documentation

## 2014-12-06 DIAGNOSIS — G43909 Migraine, unspecified, not intractable, without status migrainosus: Secondary | ICD-10-CM | POA: Insufficient documentation

## 2014-12-06 DIAGNOSIS — S8992XA Unspecified injury of left lower leg, initial encounter: Secondary | ICD-10-CM | POA: Insufficient documentation

## 2014-12-06 DIAGNOSIS — S3690XA Unspecified injury of unspecified intra-abdominal organ, initial encounter: Secondary | ICD-10-CM | POA: Diagnosis not present

## 2014-12-06 DIAGNOSIS — M79642 Pain in left hand: Secondary | ICD-10-CM | POA: Diagnosis not present

## 2014-12-06 DIAGNOSIS — S0990XA Unspecified injury of head, initial encounter: Secondary | ICD-10-CM | POA: Diagnosis not present

## 2014-12-06 DIAGNOSIS — Z880 Allergy status to penicillin: Secondary | ICD-10-CM | POA: Diagnosis not present

## 2014-12-06 DIAGNOSIS — S60222A Contusion of left hand, initial encounter: Secondary | ICD-10-CM | POA: Diagnosis not present

## 2014-12-06 DIAGNOSIS — Z23 Encounter for immunization: Secondary | ICD-10-CM | POA: Diagnosis not present

## 2014-12-06 DIAGNOSIS — J449 Chronic obstructive pulmonary disease, unspecified: Secondary | ICD-10-CM | POA: Diagnosis not present

## 2014-12-06 DIAGNOSIS — R1031 Right lower quadrant pain: Secondary | ICD-10-CM

## 2014-12-06 DIAGNOSIS — R0989 Other specified symptoms and signs involving the circulatory and respiratory systems: Secondary | ICD-10-CM | POA: Diagnosis not present

## 2014-12-06 DIAGNOSIS — S199XXA Unspecified injury of neck, initial encounter: Secondary | ICD-10-CM | POA: Diagnosis not present

## 2014-12-06 DIAGNOSIS — S8002XA Contusion of left knee, initial encounter: Secondary | ICD-10-CM | POA: Diagnosis not present

## 2014-12-06 DIAGNOSIS — R10819 Abdominal tenderness, unspecified site: Secondary | ICD-10-CM | POA: Diagnosis not present

## 2014-12-06 LAB — CBC WITH DIFFERENTIAL/PLATELET
Basophils Absolute: 0 10*3/uL (ref 0.0–0.1)
Basophils Relative: 0 % (ref 0–1)
Eosinophils Absolute: 0.5 10*3/uL (ref 0.0–0.7)
Eosinophils Relative: 5 % (ref 0–5)
HCT: 36.2 % (ref 36.0–46.0)
Hemoglobin: 12.1 g/dL (ref 12.0–15.0)
Lymphocytes Relative: 31 % (ref 12–46)
Lymphs Abs: 3.1 10*3/uL (ref 0.7–4.0)
MCH: 31.8 pg (ref 26.0–34.0)
MCHC: 33.4 g/dL (ref 30.0–36.0)
MCV: 95.3 fL (ref 78.0–100.0)
Monocytes Absolute: 0.7 10*3/uL (ref 0.1–1.0)
Monocytes Relative: 7 % (ref 3–12)
Neutro Abs: 5.8 10*3/uL (ref 1.7–7.7)
Neutrophils Relative %: 57 % (ref 43–77)
Platelets: 297 10*3/uL (ref 150–400)
RBC: 3.8 MIL/uL — ABNORMAL LOW (ref 3.87–5.11)
RDW: 12.8 % (ref 11.5–15.5)
WBC: 10 10*3/uL (ref 4.0–10.5)

## 2014-12-06 LAB — COMPREHENSIVE METABOLIC PANEL
ALT: 14 U/L (ref 14–54)
AST: 19 U/L (ref 15–41)
Albumin: 3.6 g/dL (ref 3.5–5.0)
Alkaline Phosphatase: 83 U/L (ref 38–126)
Anion gap: 8 (ref 5–15)
BUN: 7 mg/dL (ref 6–20)
CO2: 26 mmol/L (ref 22–32)
Calcium: 8.9 mg/dL (ref 8.9–10.3)
Chloride: 105 mmol/L (ref 101–111)
Creatinine, Ser: 0.68 mg/dL (ref 0.44–1.00)
GFR calc Af Amer: >60 mL/min (ref >60)
GFR calc non Af Amer: >60 mL/min (ref >60)
Glucose, Bld: 105 mg/dL — ABNORMAL HIGH (ref 65–99)
Potassium: 3.3 mmol/L — ABNORMAL LOW (ref 3.5–5.1)
Sodium: 139 mmol/L (ref 135–145)
Total Bilirubin: 0.4 mg/dL (ref 0.3–1.2)
Total Protein: 6.6 g/dL (ref 6.5–8.1)

## 2014-12-06 MED ORDER — TETANUS-DIPHTH-ACELL PERTUSSIS 5-2.5-18.5 LF-MCG/0.5 IM SUSP
0.5000 mL | Freq: Once | INTRAMUSCULAR | Status: AC
  Administered 2014-12-06: 0.5 mL via INTRAMUSCULAR
  Filled 2014-12-06: qty 0.5

## 2014-12-06 MED ORDER — HYDROCODONE-ACETAMINOPHEN 5-325 MG PO TABS
1.0000 | ORAL_TABLET | Freq: Four times a day (QID) | ORAL | Status: DC | PRN
Start: 2014-12-06 — End: 2014-12-06

## 2014-12-06 MED ORDER — IOHEXOL 300 MG/ML  SOLN: 100.0000 mL | Freq: Once | INTRAMUSCULAR | Status: DC | PRN

## 2014-12-06 MED ORDER — HYDROCODONE-ACETAMINOPHEN 5-325 MG PO TABS
1.0000 | ORAL_TABLET | Freq: Once | ORAL | Status: AC
  Administered 2014-12-06: 1 via ORAL
  Filled 2014-12-06: qty 1

## 2014-12-06 MED ORDER — SODIUM CHLORIDE 0.9 % IV BOLUS (SEPSIS)
500.0000 mL | Freq: Once | INTRAVENOUS | Status: AC
  Administered 2014-12-06: 500 mL via INTRAVENOUS

## 2014-12-06 NOTE — Discharge Instructions (Signed)
Follow up with dr. Arnoldo Morale this week.  Return sooner if problems
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
## 2014-12-06 NOTE — ED Notes (Addendum)
MD at bedside. 

## 2014-12-07 ENCOUNTER — Encounter (HOSPITAL_COMMUNITY): Payer: Self-pay | Admitting: Hematology & Oncology

## 2014-12-13 ENCOUNTER — Encounter (HOSPITAL_COMMUNITY): Payer: Self-pay | Admitting: Emergency Medicine

## 2014-12-13 ENCOUNTER — Emergency Department (HOSPITAL_COMMUNITY)
Admission: EM | Admit: 2014-12-13 | Discharge: 2014-12-13 | Disposition: A | Discharge status: Home / Self Care | Payer: Medicare Other | Attending: Emergency Medicine | Admitting: Emergency Medicine

## 2014-12-13 DIAGNOSIS — M1990 Unspecified osteoarthritis, unspecified site: Secondary | ICD-10-CM | POA: Insufficient documentation

## 2014-12-13 DIAGNOSIS — S99912A Unspecified injury of left ankle, initial encounter: Secondary | ICD-10-CM | POA: Insufficient documentation

## 2014-12-13 DIAGNOSIS — E785 Hyperlipidemia, unspecified: Secondary | ICD-10-CM | POA: Diagnosis not present

## 2014-12-13 DIAGNOSIS — I10 Essential (primary) hypertension: Secondary | ICD-10-CM | POA: Diagnosis not present

## 2014-12-13 DIAGNOSIS — Y9389 Activity, other specified: Secondary | ICD-10-CM | POA: Diagnosis not present

## 2014-12-13 DIAGNOSIS — Z79899 Other long term (current) drug therapy: Secondary | ICD-10-CM | POA: Diagnosis not present

## 2014-12-13 DIAGNOSIS — S8992XA Unspecified injury of left lower leg, initial encounter: Secondary | ICD-10-CM | POA: Insufficient documentation

## 2014-12-13 DIAGNOSIS — Y998 Other external cause status: Secondary | ICD-10-CM | POA: Diagnosis not present

## 2014-12-13 DIAGNOSIS — J449 Chronic obstructive pulmonary disease, unspecified: Secondary | ICD-10-CM | POA: Diagnosis not present

## 2014-12-13 DIAGNOSIS — E669 Obesity, unspecified: Secondary | ICD-10-CM | POA: Insufficient documentation

## 2014-12-13 DIAGNOSIS — Y92410 Unspecified street and highway as the place of occurrence of the external cause: Secondary | ICD-10-CM | POA: Diagnosis not present

## 2014-12-13 DIAGNOSIS — F329 Major depressive disorder, single episode, unspecified: Secondary | ICD-10-CM | POA: Insufficient documentation

## 2014-12-13 DIAGNOSIS — Z720 Tobacco use: Secondary | ICD-10-CM | POA: Diagnosis not present

## 2014-12-13 DIAGNOSIS — M79605 Pain in left leg: Secondary | ICD-10-CM

## 2014-12-13 DIAGNOSIS — S29001A Unspecified injury of muscle and tendon of front wall of thorax, initial encounter: Secondary | ICD-10-CM | POA: Diagnosis not present

## 2014-12-13 DIAGNOSIS — G8929 Other chronic pain: Secondary | ICD-10-CM | POA: Diagnosis not present

## 2014-12-13 DIAGNOSIS — S3991XA Unspecified injury of abdomen, initial encounter: Secondary | ICD-10-CM | POA: Diagnosis not present

## 2014-12-13 DIAGNOSIS — S301XXA Contusion of abdominal wall, initial encounter: Secondary | ICD-10-CM | POA: Insufficient documentation

## 2014-12-13 DIAGNOSIS — M25572 Pain in left ankle and joints of left foot: Secondary | ICD-10-CM | POA: Diagnosis not present

## 2014-12-13 DIAGNOSIS — Z880 Allergy status to penicillin: Secondary | ICD-10-CM | POA: Insufficient documentation

## 2014-12-13 DIAGNOSIS — F419 Anxiety disorder, unspecified: Secondary | ICD-10-CM | POA: Insufficient documentation

## 2014-12-13 DIAGNOSIS — Z7951 Long term (current) use of inhaled steroids: Secondary | ICD-10-CM | POA: Insufficient documentation

## 2014-12-13 DIAGNOSIS — G40909 Epilepsy, unspecified, not intractable, without status epilepticus: Secondary | ICD-10-CM | POA: Diagnosis not present

## 2014-12-13 LAB — COMPREHENSIVE METABOLIC PANEL
ALT: 11 U/L — ABNORMAL LOW (ref 14–54)
AST: 20 U/L (ref 15–41)
Albumin: 3.4 g/dL — ABNORMAL LOW (ref 3.5–5.0)
Alkaline Phosphatase: 102 U/L (ref 38–126)
Anion gap: 8 (ref 5–15)
BUN: 5 mg/dL — ABNORMAL LOW (ref 6–20)
CO2: 29 mmol/L (ref 22–32)
Calcium: 9.3 mg/dL (ref 8.9–10.3)
Chloride: 102 mmol/L (ref 101–111)
Creatinine, Ser: 0.76 mg/dL (ref 0.44–1.00)
GFR calc Af Amer: >60 mL/min (ref >60)
GFR calc non Af Amer: >60 mL/min (ref >60)
Glucose, Bld: 109 mg/dL — ABNORMAL HIGH (ref 65–99)
Potassium: 3.8 mmol/L (ref 3.5–5.1)
Sodium: 139 mmol/L (ref 135–145)
Total Bilirubin: 0.4 mg/dL (ref 0.3–1.2)
Total Protein: 6.6 g/dL (ref 6.5–8.1)

## 2014-12-13 LAB — CBC WITH DIFFERENTIAL/PLATELET
Basophils Absolute: 0.1 10*3/uL (ref 0.0–0.1)
Basophils Relative: 1 % (ref 0–1)
Eosinophils Absolute: 0.9 10*3/uL — ABNORMAL HIGH (ref 0.0–0.7)
Eosinophils Relative: 10 % — ABNORMAL HIGH (ref 0–5)
HCT: 34.2 % — ABNORMAL LOW (ref 36.0–46.0)
Hemoglobin: 11.5 g/dL — ABNORMAL LOW (ref 12.0–15.0)
Lymphocytes Relative: 41 % (ref 12–46)
Lymphs Abs: 4 10*3/uL (ref 0.7–4.0)
MCH: 32.8 pg (ref 26.0–34.0)
MCHC: 33.6 g/dL (ref 30.0–36.0)
MCV: 97.4 fL (ref 78.0–100.0)
Monocytes Absolute: 0.6 10*3/uL (ref 0.1–1.0)
Monocytes Relative: 7 % (ref 3–12)
Neutro Abs: 3.8 10*3/uL (ref 1.7–7.7)
Neutrophils Relative %: 41 % — ABNORMAL LOW (ref 43–77)
Platelets: 355 10*3/uL (ref 150–400)
RBC: 3.51 MIL/uL — ABNORMAL LOW (ref 3.87–5.11)
RDW: 13.2 % (ref 11.5–15.5)
WBC: 9.4 10*3/uL (ref 4.0–10.5)

## 2014-12-13 MED ORDER — HYDROCODONE-ACETAMINOPHEN 5-325 MG PO TABS: 1.0000 | ORAL_TABLET | Freq: Four times a day (QID) | ORAL | Status: DC | PRN

## 2014-12-13 MED ORDER — OXYCODONE-ACETAMINOPHEN 5-325 MG PO TABS
1.0000 | ORAL_TABLET | Freq: Once | ORAL | Status: AC
  Administered 2014-12-13: 1 via ORAL
  Filled 2014-12-13: qty 1
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
## 2014-12-13 NOTE — ED Notes (Signed)
Pt requesting to leave AMA. MD aware and at bedside.

## 2014-12-13 NOTE — ED Notes (Signed)
Chris Narasimhan [phone] (Husband)
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
## 2014-12-14 ENCOUNTER — Telehealth: Payer: Self-pay | Admitting: *Deleted

## 2014-12-14 NOTE — Telephone Encounter (Signed)
Pt has a appt with Dr. Merlene Laughter 12/29/14 at 8:30

## 2014-12-21 ENCOUNTER — Ambulatory Visit: Payer: Medicare Other | Admitting: Family Medicine

## 2014-12-29 DIAGNOSIS — M79605 Pain in left leg: Secondary | ICD-10-CM | POA: Diagnosis not present

## 2014-12-29 DIAGNOSIS — R569 Unspecified convulsions: Secondary | ICD-10-CM | POA: Diagnosis not present

## 2014-12-29 DIAGNOSIS — G43909 Migraine, unspecified, not intractable, without status migrainosus: Secondary | ICD-10-CM | POA: Diagnosis not present

## 2014-12-29 DIAGNOSIS — M542 Cervicalgia: Secondary | ICD-10-CM | POA: Diagnosis not present

## 2014-12-30 ENCOUNTER — Ambulatory Visit: Payer: Medicare Other | Admitting: Family Medicine

## 2015-01-03 NOTE — Assessment & Plan Note (Signed)
Patient counseled for approximately 5 minutes regarding the health risks of ongoing nicotine use, specifically all types of cancer, heart disease, stroke and respiratory failure. The options available for help with cessation ,the behavioral changes to assist the process, and the option to either gradully reduce usage  Or abruptly stop.is also discussed. Pt is also encouraged to set specific goals in number of cigarettes used daily, as well as to set a quit date.  Number of cigarettes/cigars currently smoking daily: 3 to 5  

## 2015-01-03 NOTE — Assessment & Plan Note (Signed)
Controlled, no change in medication DASH diet and commitment to daily physical activity for a minimum of 30 minutes discussed and encouraged, as a part of hypertension management. The importance of attaining a healthy weight is also discussed.  BP/Weight 12/13/2014 12/06/2014 11/30/2014 11/26/2014 11/12/2014 10/22/2014 2/58/5277  Systolic BP 99 824 235 361 443 154 008  Diastolic BP 59 87 62 96 71 78 84  Wt. (Lbs) 145 145 171 165 177 174.9 174  BMI 25.69 25.69 30.3 29.68 31.84 31.46 30.83

## 2015-01-03 NOTE — Assessment & Plan Note (Signed)
Chronic with current flare, toradol in office

## 2015-01-03 NOTE — Assessment & Plan Note (Signed)
Recurrent nausea and vomit, zofran in office and medication prescribed also

## 2015-01-03 NOTE — Assessment & Plan Note (Signed)
CT abdomen to further evaluiate, exam is abnormal

## 2015-01-09 DIAGNOSIS — Z23 Encounter for immunization: Secondary | ICD-10-CM | POA: Diagnosis not present

## 2015-01-10 ENCOUNTER — Ambulatory Visit (INDEPENDENT_AMBULATORY_CARE_PROVIDER_SITE_OTHER): Payer: Medicare Other | Admitting: Family Medicine

## 2015-01-10 ENCOUNTER — Encounter: Payer: Self-pay | Admitting: Family Medicine

## 2015-01-10 VITALS — BP 120/80 | HR 68 | Temp 98.8°F | Resp 16 | Ht 63.0 in | Wt 179.0 lb

## 2015-01-10 DIAGNOSIS — M549 Dorsalgia, unspecified: Secondary | ICD-10-CM

## 2015-01-10 DIAGNOSIS — R7301 Impaired fasting glucose: Secondary | ICD-10-CM

## 2015-01-10 DIAGNOSIS — I10 Essential (primary) hypertension: Secondary | ICD-10-CM

## 2015-01-10 DIAGNOSIS — R1013 Epigastric pain: Secondary | ICD-10-CM

## 2015-01-10 DIAGNOSIS — E785 Hyperlipidemia, unspecified: Secondary | ICD-10-CM

## 2015-01-10 DIAGNOSIS — M25562 Pain in left knee: Secondary | ICD-10-CM | POA: Diagnosis not present

## 2015-01-10 DIAGNOSIS — F17200 Nicotine dependence, unspecified, uncomplicated: Secondary | ICD-10-CM

## 2015-01-10 DIAGNOSIS — F17210 Nicotine dependence, cigarettes, uncomplicated: Secondary | ICD-10-CM

## 2015-01-10 DIAGNOSIS — R1030 Lower abdominal pain, unspecified: Secondary | ICD-10-CM | POA: Insufficient documentation

## 2015-01-10 DIAGNOSIS — M25572 Pain in left ankle and joints of left foot: Secondary | ICD-10-CM

## 2015-01-10 MED ORDER — PROMETHAZINE HCL 25 MG PO TABS: 25.0000 mg | ORAL_TABLET | Freq: Every day | ORAL | Status: DC | PRN

## 2015-01-10 MED ORDER — LEVOTHYROXINE SODIUM 25 MCG PO TABS: 25.0000 ug | ORAL_TABLET | Freq: Every day | ORAL | Status: DC

## 2015-01-10 MED ORDER — BECLOMETHASONE DIPROPIONATE 40 MCG/ACT IN AERS: 1.0000 | INHALATION_SPRAY | Freq: Two times a day (BID) | RESPIRATORY_TRACT | Status: DC

## 2015-01-10 MED ORDER — KETOROLAC TROMETHAMINE 60 MG/2ML IM SOLN
60.0000 mg | Freq: Once | INTRAMUSCULAR | Status: AC
  Administered 2015-01-10: 60 mg via INTRAMUSCULAR

## 2015-01-10 MED ORDER — CLONIDINE HCL 0.1 MG PO TABS: ORAL_TABLET | ORAL | Status: DC

## 2015-01-10 MED ORDER — ROSUVASTATIN CALCIUM 20 MG PO TABS: ORAL_TABLET | ORAL | Status: DC
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
## 2015-01-10 NOTE — Assessment & Plan Note (Signed)
Increased  Pain, toradol 60 mg IM office

## 2015-01-11 ENCOUNTER — Telehealth: Payer: Self-pay | Admitting: *Deleted

## 2015-01-11 ENCOUNTER — Other Ambulatory Visit: Payer: Self-pay | Admitting: Family Medicine

## 2015-01-11 MED ORDER — TRAMADOL HCL 50 MG PO TABS: 50.0000 mg | ORAL_TABLET | Freq: Three times a day (TID) | ORAL | Status: DC | PRN

## 2015-01-11 NOTE — Addendum Note (Signed)
Addended by: Denman George B on: 01/11/2015 05:07 PM   Modules accepted: Orders

## 2015-01-11 NOTE — Telephone Encounter (Signed)
Pt called requesting something for pain.

## 2015-01-11 NOTE — Telephone Encounter (Signed)
States she has been up all night in pain, she cannot even stand up straight she is hurting so bad in her lower back. She can't walk. Stopped seeing [place] when she started seeing the surgeon at [facility]. Called surgeon's office and he won't be back in for 2 weeks. States she needs something for pain very badly. Please advise

## 2015-01-11 NOTE — Telephone Encounter (Signed)
Attempted to reach patient.  Voicemail is full and unable to take messages.   Tramadol 50mg  sent to pharmacy as ordered

## 2015-01-11 NOTE — Telephone Encounter (Signed)
pls call pt advise and send in tramadol 50 mg one tab 3 times daily as needed, for paiin #30 only

## 2015-01-11 NOTE — Telephone Encounter (Signed)
Patient aware.  And med sent to pharmacy.

## 2015-01-13 ENCOUNTER — Emergency Department (HOSPITAL_COMMUNITY): Payer: Medicare Other

## 2015-01-13 ENCOUNTER — Encounter (HOSPITAL_COMMUNITY): Payer: Self-pay | Admitting: Emergency Medicine

## 2015-01-13 ENCOUNTER — Emergency Department (HOSPITAL_COMMUNITY)
Admission: EM | Admit: 2015-01-13 | Discharge: 2015-01-13 | Disposition: A | Discharge status: Home / Self Care | Payer: Medicare Other | Attending: Emergency Medicine | Admitting: Emergency Medicine

## 2015-01-13 DIAGNOSIS — M1990 Unspecified osteoarthritis, unspecified site: Secondary | ICD-10-CM | POA: Insufficient documentation

## 2015-01-13 DIAGNOSIS — Z862 Personal history of diseases of the blood and blood-forming organs and certain disorders involving the immune mechanism: Secondary | ICD-10-CM | POA: Diagnosis not present

## 2015-01-13 DIAGNOSIS — J449 Chronic obstructive pulmonary disease, unspecified: Secondary | ICD-10-CM | POA: Diagnosis not present

## 2015-01-13 DIAGNOSIS — Z7951 Long term (current) use of inhaled steroids: Secondary | ICD-10-CM | POA: Diagnosis not present

## 2015-01-13 DIAGNOSIS — G8929 Other chronic pain: Secondary | ICD-10-CM | POA: Diagnosis not present

## 2015-01-13 DIAGNOSIS — M5431 Sciatica, right side: Secondary | ICD-10-CM | POA: Insufficient documentation

## 2015-01-13 DIAGNOSIS — Z720 Tobacco use: Secondary | ICD-10-CM | POA: Diagnosis not present

## 2015-01-13 DIAGNOSIS — G43909 Migraine, unspecified, not intractable, without status migrainosus: Secondary | ICD-10-CM | POA: Insufficient documentation

## 2015-01-13 DIAGNOSIS — Z87448 Personal history of other diseases of urinary system: Secondary | ICD-10-CM | POA: Insufficient documentation

## 2015-01-13 DIAGNOSIS — E669 Obesity, unspecified: Secondary | ICD-10-CM | POA: Diagnosis not present

## 2015-01-13 DIAGNOSIS — F418 Other specified anxiety disorders: Secondary | ICD-10-CM | POA: Insufficient documentation

## 2015-01-13 DIAGNOSIS — F319 Bipolar disorder, unspecified: Secondary | ICD-10-CM | POA: Insufficient documentation

## 2015-01-13 DIAGNOSIS — R4781 Slurred speech: Secondary | ICD-10-CM | POA: Diagnosis not present

## 2015-01-13 DIAGNOSIS — Z8719 Personal history of other diseases of the digestive system: Secondary | ICD-10-CM | POA: Diagnosis not present

## 2015-01-13 DIAGNOSIS — Z880 Allergy status to penicillin: Secondary | ICD-10-CM | POA: Insufficient documentation

## 2015-01-13 DIAGNOSIS — I10 Essential (primary) hypertension: Secondary | ICD-10-CM | POA: Insufficient documentation

## 2015-01-13 DIAGNOSIS — Z79899 Other long term (current) drug therapy: Secondary | ICD-10-CM | POA: Diagnosis not present

## 2015-01-13 DIAGNOSIS — M549 Dorsalgia, unspecified: Secondary | ICD-10-CM | POA: Diagnosis not present

## 2015-01-13 DIAGNOSIS — E785 Hyperlipidemia, unspecified: Secondary | ICD-10-CM | POA: Diagnosis not present

## 2015-01-13 DIAGNOSIS — M5441 Lumbago with sciatica, right side: Secondary | ICD-10-CM | POA: Diagnosis not present

## 2015-01-13 MED ORDER — HYDROCODONE-ACETAMINOPHEN 5-325 MG PO TABS: 1.0000 | ORAL_TABLET | Freq: Four times a day (QID) | ORAL | Status: DC | PRN

## 2015-01-13 MED ORDER — GABAPENTIN 300 MG PO CAPS: 300.0000 mg | ORAL_CAPSULE | Freq: Two times a day (BID) | ORAL | Status: DC

## 2015-01-13 MED ORDER — HYDROCODONE-ACETAMINOPHEN 5-325 MG PO TABS
1.0000 | ORAL_TABLET | Freq: Once | ORAL | Status: AC
Start: 2015-01-13 — End: 2015-01-13
  Administered 2015-01-13: 1 via ORAL
  Filled 2015-01-13: qty 1

## 2015-01-13 MED ORDER — METHYLPREDNISOLONE 4 MG PO TBPK: ORAL_TABLET | ORAL | Status: DC

## 2015-01-13 MED ORDER — KETOROLAC TROMETHAMINE 60 MG/2ML IM SOLN
60.0000 mg | Freq: Once | INTRAMUSCULAR | Status: AC
  Administered 2015-01-13: 60 mg via INTRAMUSCULAR
  Filled 2015-01-13: qty 2

## 2015-01-13 MED ORDER — GABAPENTIN 300 MG PO CAPS
300.0000 mg | ORAL_CAPSULE | Freq: Once | ORAL | Status: DC
  Filled 2015-01-13: qty 1

## 2015-01-13 NOTE — Discharge Instructions (Signed)
Sciatica Sciatica is pain, weakness, numbness, or tingling along the path of the sciatic nerve. The nerve starts in the lower back and runs down the back of each leg. The nerve controls the muscles in the lower leg and in the back of the knee, while also providing sensation to the back of the thigh, lower leg, and the sole of your foot. Sciatica is a symptom of another medical condition. For instance, nerve damage or certain conditions, such as a herniated disk or bone spur on the spine, pinch or put pressure on the sciatic nerve. This causes the pain, weakness, or other sensations normally associated with sciatica. Generally, sciatica only affects one side of the body. CAUSES   Herniated or slipped disc.  Degenerative disk disease.  A pain disorder involving the narrow muscle in the buttocks (piriformis syndrome).  Pelvic injury or fracture.  Pregnancy.  Tumor (rare). SYMPTOMS  Symptoms can vary from mild to very severe. The symptoms usually travel from the low back to the buttocks and down the back of the leg. Symptoms can include:  Mild tingling or dull aches in the lower back, leg, or hip.  Numbness in the back of the calf or sole of the foot.  Burning sensations in the lower back, leg, or hip.  Sharp pains in the lower back, leg, or hip.  Leg weakness.  Severe back pain inhibiting movement. These symptoms may get worse with coughing, sneezing, laughing, or prolonged sitting or standing. Also, being overweight may worsen symptoms. DIAGNOSIS  Your caregiver will perform a physical exam to look for common symptoms of sciatica. He or she may ask you to do certain movements or activities that would trigger sciatic nerve pain. Other tests may be performed to find the cause of the sciatica. These may include:  Blood tests.  X-rays.  Imaging tests, such as an MRI or CT scan. TREATMENT  Treatment is directed at the cause of the sciatic pain. Sometimes, treatment is not necessary  and the pain and discomfort goes away on its own. If treatment is needed, your caregiver may suggest:  Over-the-counter medicines to relieve pain.  Prescription medicines, such as anti-inflammatory medicine, muscle relaxants, or narcotics.  Applying heat or ice to the painful area.  Steroid injections to lessen pain, irritation, and inflammation around the nerve.  Reducing activity during periods of pain.  Exercising and stretching to strengthen your abdomen and improve flexibility of your spine. Your caregiver may suggest losing weight if the extra weight makes the back pain worse.  Physical therapy.  Surgery to eliminate what is pressing or pinching the nerve, such as a bone spur or part of a herniated disk. HOME CARE INSTRUCTIONS   Only take over-the-counter or prescription medicines for pain or discomfort as directed by your caregiver.  Apply ice to the affected area for 20 minutes, 3-4 times a day for the first 48-72 hours. Then try heat in the same way.  Exercise, stretch, or perform your usual activities if these do not aggravate your pain.  Attend physical therapy sessions as directed by your caregiver.  Keep all follow-up appointments as directed by your caregiver.  Do not wear high heels or shoes that do not provide proper support.  Check your mattress to see if it is too soft. A firm mattress may lessen your pain and discomfort. SEEK IMMEDIATE MEDICAL CARE IF:   You lose control of your bowel or bladder (incontinence).  You have increasing weakness in the lower back, pelvis, buttocks,   or legs.  You have redness or swelling of your back.  You have a burning sensation when you urinate.  You have pain that gets worse when you lie down or awakens you at night.  Your pain is worse than you have experienced in the past.  Your pain is lasting longer than 4 weeks.  You are suddenly losing weight without reason. MAKE SURE YOU:  Understand these  instructions.  Will watch your condition.  Will get help right away if you are not doing well or get worse. Document Released: 04/03/2001 Document Revised: 10/09/2011 Document Reviewed: 08/19/2011 ExitCare Patient Information 2015 [employer]. This information is not intended to replace advice given to you by your health care provider. Make sure you discuss any questions you have with your health care provider.  

## 2015-01-13 NOTE — ED Notes (Signed)
Patient able to ambulate with with assistance x2

## 2015-01-13 NOTE — ED Notes (Signed)
Patient states she is a fall risk due to her back ailments and states she has fallen in the ED at a previous visit
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
## 2015-01-13 NOTE — ED Notes (Signed)
Patient complaining of back pain x 3 days. Denies injury.

## 2015-01-14 DIAGNOSIS — R569 Unspecified convulsions: Secondary | ICD-10-CM | POA: Diagnosis not present

## 2015-01-18 ENCOUNTER — Telehealth: Payer: Self-pay | Admitting: *Deleted

## 2015-01-18 NOTE — Telephone Encounter (Signed)
Kelly from [employer] called and stated they have not seen Ms Avalyn, Molino stated they can't get in touch with er to schedule a appt and her VM is full.

## 2015-01-18 NOTE — Telephone Encounter (Signed)
Noted. Tried to call pt and let her know but went strauight to voicemail and unable to leave a message

## 2015-01-25 ENCOUNTER — Ambulatory Visit: Payer: Medicare Other | Admitting: Family Medicine

## 2015-01-26 DIAGNOSIS — G44221 Chronic tension-type headache, intractable: Secondary | ICD-10-CM | POA: Diagnosis not present

## 2015-01-26 DIAGNOSIS — M549 Dorsalgia, unspecified: Secondary | ICD-10-CM | POA: Diagnosis not present

## 2015-01-26 DIAGNOSIS — R569 Unspecified convulsions: Secondary | ICD-10-CM | POA: Diagnosis not present

## 2015-01-26 DIAGNOSIS — G43719 Chronic migraine without aura, intractable, without status migrainosus: Secondary | ICD-10-CM | POA: Diagnosis not present

## 2015-01-31 DIAGNOSIS — F319 Bipolar disorder, unspecified: Secondary | ICD-10-CM | POA: Diagnosis not present

## 2015-02-11 ENCOUNTER — Other Ambulatory Visit: Payer: Self-pay | Admitting: Family Medicine

## 2015-02-11 DIAGNOSIS — Z1231 Encounter for screening mammogram for malignant neoplasm of breast: Secondary | ICD-10-CM

## 2015-02-21 ENCOUNTER — Ambulatory Visit (INDEPENDENT_AMBULATORY_CARE_PROVIDER_SITE_OTHER): Payer: Medicare Other | Admitting: Family Medicine

## 2015-02-21 ENCOUNTER — Encounter: Payer: Self-pay | Admitting: Family Medicine

## 2015-02-21 VITALS — BP 120/72 | HR 84 | Resp 18 | Ht 63.0 in | Wt 169.0 lb

## 2015-02-21 DIAGNOSIS — J0101 Acute recurrent maxillary sinusitis: Secondary | ICD-10-CM

## 2015-02-21 DIAGNOSIS — J309 Allergic rhinitis, unspecified: Secondary | ICD-10-CM | POA: Insufficient documentation

## 2015-02-21 DIAGNOSIS — I10 Essential (primary) hypertension: Secondary | ICD-10-CM

## 2015-02-21 DIAGNOSIS — J3089 Other allergic rhinitis: Secondary | ICD-10-CM | POA: Diagnosis not present

## 2015-02-21 DIAGNOSIS — M25572 Pain in left ankle and joints of left foot: Secondary | ICD-10-CM | POA: Diagnosis not present

## 2015-02-21 DIAGNOSIS — J0100 Acute maxillary sinusitis, unspecified: Secondary | ICD-10-CM | POA: Insufficient documentation

## 2015-02-21 DIAGNOSIS — R1030 Lower abdominal pain, unspecified: Secondary | ICD-10-CM

## 2015-02-21 DIAGNOSIS — F17200 Nicotine dependence, unspecified, uncomplicated: Secondary | ICD-10-CM | POA: Diagnosis not present

## 2015-02-21 DIAGNOSIS — E785 Hyperlipidemia, unspecified: Secondary | ICD-10-CM

## 2015-02-21 MED ORDER — AZELASTINE HCL 0.1 % NA SOLN: 2.0000 | Freq: Two times a day (BID) | NASAL | Status: DC

## 2015-02-21 MED ORDER — FLUCONAZOLE 150 MG PO TABS: 150.0000 mg | ORAL_TABLET | Freq: Once | ORAL | Status: DC

## 2015-02-21 MED ORDER — KETOROLAC TROMETHAMINE 60 MG/2ML IM SOLN
60.0000 mg | Freq: Once | INTRAMUSCULAR | Status: AC
  Administered 2015-02-21: 60 mg via INTRAMUSCULAR

## 2015-02-21 MED ORDER — LEVOFLOXACIN 500 MG PO TABS: 500.0000 mg | ORAL_TABLET | Freq: Every day | ORAL | Status: DC

## 2015-02-21 MED ORDER — FLUTICASONE PROPIONATE 50 MCG/ACT NA SUSP: 2.0000 | Freq: Every day | NASAL | Status: DC

## 2015-02-21 MED ORDER — LEVOTHYROXINE SODIUM 25 MCG PO TABS: 25.0000 ug | ORAL_TABLET | Freq: Every day | ORAL | Status: DC

## 2015-02-21 MED ORDER — MONTELUKAST SODIUM 10 MG PO TABS: 10.0000 mg | ORAL_TABLET | Freq: Every day | ORAL | Status: DC

## 2015-02-21 NOTE — Assessment & Plan Note (Signed)
Uncontrolled , start daily flonase , astelin and singulair, daily

## 2015-02-21 NOTE — Progress Notes (Signed)
   Subjective:    Patient ID: Brooke Maddox, female    DOB: 08/23/1970, 44 y.o.   MRN: [id number]  HPI Pt in with c/o facial pain and pressure with green nasal drainage from left nostril, also has had chills but no documented fever. Symptoms started 3 days ago C/o left ankle pain , and difficulty weight bearing following a fall 2 days ago.c/olower abdominal pain persisting , though less, wants a brace for back pain, but unable to tolerate the pressure on her stomach. Wants help with smoking cessation as she continues to smoke, and wants to quit   Review of Systems See HPI Denies recent fever or chills. Denies chest pains, palpitations and leg swelling Denies  nausea, vomiting,diarrhea or constipation.   Denies dysuria, frequency, hesitancy or incontinence.  Denies headaches, seizures, numbness, or tingling. Denies uncontrolled depression, anxiety or insomnia. Denies skin break down or rash.         Objective:   Physical Exam BP 120/72 mmHg  Pulse 84  Resp 18  Ht 5\' 3"  (1.6 m)  Wt 169 lb (76.658 kg)  BMI 29.94 kg/m2  SpO2 98% Patient alert and oriented and in no cardiopulmonary distress.  HEENT: No facial asymmetry, EOMI,   oropharynx pink and moist.  Neck supple no JVD, no mass. Left maxillary sinus tenderness, TM clear Chest: Clear to auscultation bilaterally.Decreased air entry   CVS: S1, S2 no murmurs, no S3.Regular rate.  ABD: Soft non tender.   Ext: No edema  MS: Decreased ROM spine, normal in shoulders, hips and knees.and ankles  Skin: Intact, no ulcerations or rash noted.  Psych: Good eye contact, normal affect. Memory intact not anxious or depressed appearing.  CNS: CN 2-12 intact, power,  normal throughout.no focal deficits noted.        Assessment & Plan:  NICOTINE ADDICTION Patient counseled for approximately 5 minutes regarding the health risks of ongoing nicotine use, specifically all types of cancer, heart disease, stroke and respiratory  failure. The options available for help with cessation ,the behavioral changes to assist the process, and the option to either gradully reduce usage  Or abruptly stop.is also discussed. Pt is also encouraged to set specific goals in number of cigarettes used daily, as well as to set a quit date.  Number of cigarettes/cigars currently smoking daily: 10   Allergic rhinitis Uncontrolled , start daily flonase , astelin and singulair, daily   Maxillary sinusitis, acute Antibiotic course prescribed  Left ankle pain Recent injury, fell 2 days ago, now with pain, exam negative for fracture, however, due to pain level , xray obtained,  also toradol administered in office  Essential hypertension Controlled, no change in medication DASH diet and commitment to daily physical activity for a minimum of 30 minutes discussed and encouraged, as a part of hypertension management. The importance of attaining a healthy weight is also discussed.  BP/Weight 02/21/2015 01/13/2015 01/10/2015 12/13/2014 12/06/2014 12/26/1738 11/22/4479  Systolic BP 856 314 970 99 263 785 885  Diastolic BP 72 79 80 59 87 62 96  Wt. (Lbs) 169 165 179 145 145 171 165  BMI 29.94 29.24 31.72 25.69 25.69 30.3 29.68        Lower abdominal pain Ongoing , though lessening as hematomas resolve, pt reassured of  same

## 2015-02-21 NOTE — Assessment & Plan Note (Signed)
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
## 2015-02-23 ENCOUNTER — Telehealth: Payer: Self-pay | Admitting: Nutrition

## 2015-02-23 NOTE — Telephone Encounter (Signed)
TC to her to reschedule missed appt from referral from [name]. Going to Spine clinic tomorrow and may see nutritionist there. Will call back if appt is needed. PC

## 2015-02-28 ENCOUNTER — Ambulatory Visit (HOSPITAL_COMMUNITY)
Admission: RE | Admit: 2015-02-28 | Discharge: 2015-02-28 | Disposition: A | Discharge status: Home / Self Care | Payer: Medicare Other | Source: Ambulatory Visit | Attending: Family Medicine | Admitting: Family Medicine

## 2015-02-28 DIAGNOSIS — Z1231 Encounter for screening mammogram for malignant neoplasm of breast: Secondary | ICD-10-CM | POA: Diagnosis not present

## 2015-03-06 NOTE — Assessment & Plan Note (Signed)
Ongoing , though lessening as hematomas resolve, pt reassured of  same
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
## 2015-03-06 NOTE — Assessment & Plan Note (Signed)
Antibiotic course prescribed 

## 2015-03-06 NOTE — Assessment & Plan Note (Signed)
Controlled, no change in medication DASH diet and commitment to daily physical activity for a minimum of 30 minutes discussed and encouraged, as a part of hypertension management. The importance of attaining a healthy weight is also discussed.  BP/Weight 02/21/2015 01/13/2015 01/10/2015 12/13/2014 12/06/2014 [id number]  Systolic BP 123456 0000000 123456 99 [id number]  Diastolic BP 72 79 80 59 87 62 96  Wt. (Lbs) 169 165 179 145 145 171 165  BMI 29.94 29.24 31.72 25.69 25.69 30.3 29.68

## 2015-03-11 ENCOUNTER — Telehealth: Payer: Self-pay | Admitting: Family Medicine
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
## 2015-03-11 NOTE — Telephone Encounter (Signed)
pls  Let pt know I will need  To review recent note recommending this from surgeon seeing her,before I order repeat imaging, also often facilities like to do imaging in their own place. Explain this will be further addressed when complete info available, unlikely prior to week of 11/28

## 2015-03-11 NOTE — Telephone Encounter (Signed)
Patient has appt with Elnoria Howard On Nov 22nd and I told her the note wouldn't be reviewed by Dr Moshe Cipro until week of 11/28 so hopefully Dr Elnoria Howard will be able to order MRI at her appt
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
## 2015-03-12 ENCOUNTER — Telehealth: Payer: Self-pay | Admitting: Family Medicine

## 2015-03-12 NOTE — Telephone Encounter (Signed)
Pls let pt know that I have reviewed her most recent visit with neurosurgeon Dr Elnoria Howard. Yes, he has stated that she will need re imaging and other testing done prior to surgery, But AGAIN, I believe this will be at [facility], there is n NO request/ i p,ication that imaging wanted her , or that he is requesting that I order tests, he will order them  UNLESS I get a specific request from him. Note with specific left in your area for reference

## 2015-03-14 ENCOUNTER — Telehealth: Payer: Self-pay | Admitting: Family Medicine

## 2015-03-14 NOTE — Telephone Encounter (Signed)
Brandi please call Azaylah, she is asking to speak to you

## 2015-03-14 NOTE — Telephone Encounter (Signed)
Patient aware that Dr out of office until after her appt with Dr Elnoria Howard and that she should ask him to order imaging

## 2015-03-14 NOTE — Telephone Encounter (Signed)
Patient aware to get Dr Patrcia Dolly office to fax over a statement that they want Dr Moshe Cipro to order imaging. Patient will call them and have them to do that

## 2015-03-19 ENCOUNTER — Emergency Department (HOSPITAL_COMMUNITY)
Admission: EM | Admit: 2015-03-19 | Discharge: 2015-03-19 | Disposition: A | Discharge status: Home / Self Care | Payer: Medicare Other | Attending: Emergency Medicine | Admitting: Emergency Medicine

## 2015-03-19 ENCOUNTER — Encounter (HOSPITAL_COMMUNITY): Payer: Self-pay

## 2015-03-19 ENCOUNTER — Emergency Department (HOSPITAL_COMMUNITY): Payer: Medicare Other

## 2015-03-19 DIAGNOSIS — Y9389 Activity, other specified: Secondary | ICD-10-CM | POA: Diagnosis not present

## 2015-03-19 DIAGNOSIS — Y998 Other external cause status: Secondary | ICD-10-CM | POA: Insufficient documentation

## 2015-03-19 DIAGNOSIS — I10 Essential (primary) hypertension: Secondary | ICD-10-CM | POA: Insufficient documentation

## 2015-03-19 DIAGNOSIS — Z880 Allergy status to penicillin: Secondary | ICD-10-CM | POA: Diagnosis not present

## 2015-03-19 DIAGNOSIS — M5416 Radiculopathy, lumbar region: Secondary | ICD-10-CM | POA: Insufficient documentation

## 2015-03-19 DIAGNOSIS — Z7951 Long term (current) use of inhaled steroids: Secondary | ICD-10-CM | POA: Insufficient documentation

## 2015-03-19 DIAGNOSIS — J449 Chronic obstructive pulmonary disease, unspecified: Secondary | ICD-10-CM | POA: Diagnosis not present

## 2015-03-19 DIAGNOSIS — W1839XA Other fall on same level, initial encounter: Secondary | ICD-10-CM | POA: Insufficient documentation

## 2015-03-19 DIAGNOSIS — G40909 Epilepsy, unspecified, not intractable, without status epilepticus: Secondary | ICD-10-CM | POA: Insufficient documentation

## 2015-03-19 DIAGNOSIS — M1990 Unspecified osteoarthritis, unspecified site: Secondary | ICD-10-CM | POA: Insufficient documentation

## 2015-03-19 DIAGNOSIS — Y92009 Unspecified place in unspecified non-institutional (private) residence as the place of occurrence of the external cause: Secondary | ICD-10-CM | POA: Insufficient documentation

## 2015-03-19 DIAGNOSIS — Z79899 Other long term (current) drug therapy: Secondary | ICD-10-CM | POA: Insufficient documentation

## 2015-03-19 DIAGNOSIS — E669 Obesity, unspecified: Secondary | ICD-10-CM | POA: Diagnosis not present

## 2015-03-19 DIAGNOSIS — E785 Hyperlipidemia, unspecified: Secondary | ICD-10-CM | POA: Diagnosis not present

## 2015-03-19 DIAGNOSIS — G8929 Other chronic pain: Secondary | ICD-10-CM | POA: Insufficient documentation

## 2015-03-19 DIAGNOSIS — F17210 Nicotine dependence, cigarettes, uncomplicated: Secondary | ICD-10-CM | POA: Diagnosis not present

## 2015-03-19 DIAGNOSIS — F418 Other specified anxiety disorders: Secondary | ICD-10-CM | POA: Diagnosis not present

## 2015-03-19 DIAGNOSIS — S3992XA Unspecified injury of lower back, initial encounter: Secondary | ICD-10-CM | POA: Diagnosis not present

## 2015-03-19 DIAGNOSIS — Z87448 Personal history of other diseases of urinary system: Secondary | ICD-10-CM | POA: Diagnosis not present

## 2015-03-19 MED ORDER — KETOROLAC TROMETHAMINE 60 MG/2ML IM SOLN
60.0000 mg | Freq: Once | INTRAMUSCULAR | Status: AC
  Administered 2015-03-19: 60 mg via INTRAMUSCULAR
  Filled 2015-03-19: qty 2

## 2015-03-19 MED ORDER — OXYCODONE-ACETAMINOPHEN 5-325 MG PO TABS: 1.0000 | ORAL_TABLET | ORAL | Status: DC | PRN

## 2015-03-19 MED ORDER — METHOCARBAMOL 500 MG PO TABS: 500.0000 mg | ORAL_TABLET | Freq: Three times a day (TID) | ORAL | Status: DC

## 2015-03-19 MED ORDER — CYCLOBENZAPRINE HCL 10 MG PO TABS
10.0000 mg | ORAL_TABLET | Freq: Once | ORAL | Status: AC
  Administered 2015-03-19: 10 mg via ORAL
  Filled 2015-03-19: qty 1

## 2015-03-19 MED ORDER — OXYCODONE-ACETAMINOPHEN 5-325 MG PO TABS
2.0000 | ORAL_TABLET | Freq: Once | ORAL | Status: AC
  Administered 2015-03-19: 2 via ORAL
  Filled 2015-03-19: qty 2

## 2015-03-19 NOTE — ED Notes (Signed)
Pt ambulated by PA Tripplett

## 2015-03-19 NOTE — ED Notes (Signed)
[facility] [place] pain to lower right back has gotten progressively worse. Patient has hx of back pan and back surgeries.

## 2015-03-19 NOTE — ED Provider Notes (Signed)
CSN: [id number]     Arrival date & time 03/19/15  1106 History   First MD Initiated Contact with Patient 03/19/15 1205     Chief Complaint  Patient presents with  . Back Pain     (Consider location/radiation/quality/duration/timing/severity/associated sxs/prior Treatment) HPI   Brooke Maddox is a 44 y.o. female with chronic low back pain,  presents to the Emergency Department complaining of worsening of her chronic right sided low back pain for three days after a fall.  She reports a mechanical fall landing on her right side.  Since then, she notes having intermittent sharp pains and "spasms" in her entire right leg.  Pain is worse with weight bearing.  She has tried OTC analgesics without relief.  She denies numbness or weakness of the extremity, abdominal pain, fever, chills, urine or bowel changes.  She states that she has been told by her neurosurgeon that she has "something slipping" in her lower back and needs surgery.    Past Medical History  Diagnosis Date  . Hypertension   . Arthritis   . Depression   . Anxiety associated with depression 2007    hospitalised for mental health problems   . Seizures ([place])     Dr. Theda Sers   . Migraines   . Chronic back pain   . History of seizure disorder   . History of leukocytosis   . Bipolar affective disorder ([place])   . Osteoarthritis     mild   . Abdominal pain, acute, left lower quadrant   . Chronic constipation   . Nausea   . Nicotine addiction   . Obesity   . Fatigue   . Acute knee pain     right   . Hyperlipidemia   . COPD (chronic obstructive pulmonary disease) ([place])   . Collagen vascular disease ([place])   . Renal insufficiency   . Lumbar radiculopathy     left  . Chronic leg pain     left  . Pain management   . Leukocytosis 10/22/2014   Past Surgical History  Procedure Laterality Date  . Vesicovaginal fistula closure w/ tah  2009  . Abdominal hysterectomy    . Left heart catheterization with coronary angiogram N/A  03/17/2013    Procedure: LEFT HEART CATHETERIZATION WITH CORONARY ANGIOGRAM;  Surgeon: Peter M Martinique, MD;  Location: [facility] CATH LAB;  Service: Cardiovascular;  Laterality: N/A;   Family History  Problem Relation Age of Onset  . COPD Father   . Hypertension Father   . Arthritis Brother    Social History  Substance Use Topics  . Smoking status: Current Every Day Smoker -- 0.50 packs/day for 35 years    Types: Cigarettes    Start date: 08/30/1986  . Smokeless tobacco: Never Used  . Alcohol Use: No     Comment: for 11 years states that she has quit    OB History    Gravida Para Term Preterm AB TAB SAB Ectopic Multiple Living   2 2 2             Review of Systems  Constitutional: Negative for fever.  Respiratory: Negative for shortness of breath.   Gastrointestinal: Negative for vomiting, abdominal pain and constipation.  Genitourinary: Negative for dysuria, hematuria, flank pain, decreased urine volume and difficulty urinating.  Musculoskeletal: Positive for back pain. Negative for joint swelling.  Skin: Negative for rash.  Neurological: Negative for weakness and numbness.  All other systems reviewed and are negative.  Allergies  Dilaudid; Lithium carbonate; Morphine and related; Sulfur; Tylenol; Wellbutrin; Codeine; Divalproex sodium; Fentanyl; Iodine; Iohexol; and Penicillins  Home Medications   Prior to Admission medications   Medication Sig Start Date End Date Taking? Authorizing Provider  ALPRAZolam Duanne Moron) 0.5 MG tablet Take 0.5 mg by mouth 5 (five) times daily.   Yes Historical Provider, MD  azelastine (ASTELIN) 0.1 % nasal spray Place 2 sprays into both nostrils 2 (two) times daily. Use in each nostril as directed 02/21/15  Yes Fayrene Helper, MD  beclomethasone (QVAR) 40 MCG/ACT inhaler Inhale 1 puff into the lungs 2 (two) times daily. 01/10/15  Yes Fayrene Helper, MD  cloNIDine (CATAPRES) 0.1 MG tablet TAKE 1 TABLET BY MOUTH EVERY NIGHT AT BEDTIME 01/10/15   Yes Fayrene Helper, MD  FLUoxetine (PROZAC) 20 MG capsule TAKE 3 CAPSULES BY MOUTH EVERY MORNING 11/08/14  Yes Historical Provider, MD  fluticasone (FLONASE) 50 MCG/ACT nasal spray Place 2 sprays into both nostrils daily. 02/21/15  Yes Fayrene Helper, MD  ibuprofen (ADVIL,MOTRIN) 200 MG tablet Take 400 mg by mouth every 6 (six) hours as needed for headache.   Yes Historical Provider, MD  lamoTRIgine (LAMICTAL) 150 MG tablet Take 300 mg by mouth daily.   Yes Historical Provider, MD  levothyroxine (SYNTHROID, LEVOTHROID) 25 MCG tablet Take 1 tablet (25 mcg total) by mouth daily. 02/21/15  Yes Fayrene Helper, MD  montelukast (SINGULAIR) 10 MG tablet Take 1 tablet (10 mg total) by mouth at bedtime. 02/21/15  Yes Fayrene Helper, MD  nitroGLYCERIN (NITROSTAT) 0.4 MG SL tablet Place 1 tablet (0.4 mg total) under the tongue every 5 (five) minutes as needed for chest pain. 08/23/14  Yes Arnoldo Lenis, MD  rizatriptan (MAXALT-MLT) 5 MG disintegrating tablet Take 5 mg by mouth as needed for migraine. May repeat in 2 hours if needed   Yes Historical Provider, MD  rosuvastatin (CRESTOR) 20 MG tablet TAKE 1 TABLET BY MOUTH EVERY NIGHT AT BEDTIME 01/10/15  Yes Fayrene Helper, MD  topiramate (TOPAMAX) 25 MG tablet Take 50 mg by mouth daily.   Yes Historical Provider, MD  fluconazole (DIFLUCAN) 150 MG tablet Take 1 tablet (150 mg total) by mouth once. Patient not taking: Reported on 03/19/2015 02/21/15   Fayrene Helper, MD  gabapentin (NEURONTIN) 300 MG capsule Take 1 capsule (300 mg total) by mouth 2 (two) times daily. Patient not taking: Reported on 03/19/2015 01/13/15   Merryl Hacker, MD  levofloxacin (LEVAQUIN) 500 MG tablet Take 1 tablet (500 mg total) by mouth daily. Patient not taking: Reported on 03/19/2015 02/21/15   Fayrene Helper, MD  traMADol (ULTRAM) 50 MG tablet Take 1 tablet (50 mg total) by mouth 3 (three) times daily as needed. Patient not taking: Reported on  03/19/2015 01/11/15   Fayrene Helper, MD   BP 184/77 mmHg  Pulse 74  Temp(Src) 98.2 F (36.8 C) (Oral)  Resp 18  Ht 5\' 3"  (1.6 m)  Wt 76.658 kg  BMI 29.94 kg/m2  SpO2 100% Physical Exam  Constitutional: She is oriented to person, place, and time. She appears well-developed and well-nourished. No distress.  HENT:  Head: Normocephalic and atraumatic.  Neck: Normal range of motion. Neck supple.  Cardiovascular: Normal rate, regular rhythm, normal heart sounds and intact distal pulses.   No murmur heard. Pulmonary/Chest: Effort normal and breath sounds normal. No respiratory distress.  Abdominal: Soft. She exhibits no distension. There is no tenderness. There is no rebound and  no guarding.  Musculoskeletal: She exhibits tenderness. She exhibits no edema.       Lumbar back: She exhibits tenderness and pain. She exhibits normal range of motion, no swelling, no deformity, no laceration and normal pulse.  ttp of the lower lumbar spine and right paraspinal muscles.  DP pulses are brisk and symmetrical.  Distal sensation intact. Pt has 5/5 strength against resistance of bilateral lower extremities.     Neurological: She is alert and oriented to person, place, and time. She has normal strength. No sensory deficit. She exhibits normal muscle tone. Coordination and gait normal.  Reflex Scores:      Patellar reflexes are 2+ on the right side and 2+ on the left side.      Achilles reflexes are 2+ on the right side and 2+ on the left side. Skin: Skin is warm and dry. No rash noted.  Nursing note and vitals reviewed.   ED Course  Procedures (including critical care time) Labs Review Labs Reviewed - No data to display  Imaging Review Dg Lumbar Spine Complete  03/19/2015  CLINICAL DATA:  [place] down stairs 2 days ago EXAM: LUMBAR SPINE - COMPLETE 4+ VIEW COMPARISON:  01/13/2015 FINDINGS: Five views of lumbar spine submitted no acute fracture or subluxation. Again noted bilateral pars defect at  L5 level. Again noted about 7.5 mm anterolisthesis L5 on S1 vertebral body. Disc space flattening at L5-S1 level. IMPRESSION: No acute fracture. Again noted about 7.5 mm anterolisthesis L5 on S1 vertebral body. Disc space flattening at L5-S1 level. Bilateral pars defect at L5 level. Electronically Signed   By: Lahoma Crocker M.D.   On: 03/19/2015 13:53   I have personally reviewed and evaluated these images and lab results as part of my medical decision-making.    MDM   Final diagnoses:  Lumbar radicular pain    Patient is feeling better after medication. Has ambulated in the department. Gait is steady.  X-ray findings reviewed, it is noted that on 01/13/2015 she was noted to have 9 mm of spondylolisthesis.  Pt states this is a recurring problem and she has been told that she needs surgery, but she has been trying to delay it.  No concerning sx's for emergent neurological process at present, she prefers to f/u with her neurosurgeon at [facility] for d/c, and agrees to return here if sx's worsen.      Kem Parkinson, PA-C 03/20/15 [place], MD 03/20/15 1650

## 2015-03-19 NOTE — Discharge Instructions (Signed)
Radicular Pain °Radicular pain in either the arm or leg is usually from a bulging or herniated disk in the spine. A piece of the herniated disk may press against the nerves as the nerves exit the spine. This causes pain which is felt at the tips of the nerves down the arm or leg. Other causes of radicular pain may include: °· Fractures. °· Heart disease. °· Cancer. °· An abnormal and usually degenerative state of the nervous system or nerves (neuropathy). °Diagnosis may require CT or MRI scanning to determine the primary cause.  °Nerves that start at the neck (nerve roots) may cause radicular pain in the outer shoulder and arm. It can spread down to the thumb and fingers. The symptoms vary depending on which nerve root has been affected. In most cases radicular pain improves with conservative treatment. Neck problems may require physical therapy, a neck collar, or cervical traction. Treatment may take many weeks, and surgery may be considered if the symptoms do not improve.  °Conservative treatment is also recommended for sciatica. Sciatica causes pain to radiate from the lower back or buttock area down the leg into the foot. Often there is a history of back problems. Most patients with sciatica are better after 2 to 4 weeks of rest and other supportive care. Short term bed rest can reduce the disk pressure considerably. Sitting, however, is not a good position since this increases the pressure on the disk. You should avoid bending, lifting, and all other activities which make the problem worse. Traction can be used in severe cases. Surgery is usually reserved for patients who do not improve within the first months of treatment. °Only take over-the-counter or prescription medicines for pain, discomfort, or fever as directed by your caregiver. Narcotics and muscle relaxants may help by relieving more severe pain and spasm and by providing mild sedation. Cold or massage can give significant relief. Spinal manipulation  is not recommended. It can increase the degree of disc protrusion. Epidural steroid injections are often effective treatment for radicular pain. These injections deliver medicine to the spinal nerve in the space between the protective covering of the spinal cord and back bones (vertebrae). Your caregiver can give you more information about steroid injections. These injections are most effective when given within two weeks of the onset of pain.  °You should see your caregiver for follow up care as recommended. A program for neck and back injury rehabilitation with stretching and strengthening exercises is an important part of management.  °SEEK IMMEDIATE MEDICAL CARE IF: °· You develop increased pain, weakness, or numbness in your arm or leg. °· You develop difficulty with bladder or bowel control. °· You develop abdominal pain. °  °This information is not intended to replace advice given to you by your health care provider. Make sure you discuss any questions you have with your health care provider. °  °Document Released: 05/17/2004 Document Revised: 04/30/2014 Document Reviewed: 11/03/2014 °Elsevier Interactive Patient Education ©2016 [employer] Inc. ° °

## 2015-03-21 ENCOUNTER — Telehealth: Payer: Self-pay | Admitting: Family Medicine

## 2015-03-21 NOTE — Telephone Encounter (Signed)
Pls let pt know that i am  Aware that she was recently in ED re back pain, I recommend that she have pain clinic involved in chronic pain management as this is a recurrent  Problem. I am also aware that her surgeon will not prescribe pain medicatiion long term She already sees Dr Merlene Laughter, so  If she agrees, pls enter a referral fro mx of chronic back pain , I will sign

## 2015-03-22 ENCOUNTER — Telehealth: Payer: Self-pay | Admitting: Family Medicine

## 2015-03-22 NOTE — Telephone Encounter (Signed)
Patients calling stating that she went to the ER and had xrays of lower spine which recommends MRI, she is calling asking if [name] would order MRI or Dr. In [place] please advise?

## 2015-03-23 NOTE — Telephone Encounter (Signed)
Letter sent for patient to contact office

## 2015-03-23 NOTE — Telephone Encounter (Signed)
Attempted to reach patient by phone, number is not working.  Will send letter for patient to contact office about possible referral.

## 2015-03-25 NOTE — Telephone Encounter (Signed)
Was told that [facility] needs to order and if they wanted Dr Moshe Cipro to order then they needed to send over documentation stating so. Patient understands

## 2015-04-11 ENCOUNTER — Emergency Department (HOSPITAL_COMMUNITY)
Admission: EM | Admit: 2015-04-11 | Discharge: 2015-04-11 | Disposition: A | Discharge status: Home / Self Care | Payer: Medicare Other | Attending: Emergency Medicine | Admitting: Emergency Medicine

## 2015-04-11 ENCOUNTER — Emergency Department (HOSPITAL_COMMUNITY): Payer: Medicare Other

## 2015-04-11 ENCOUNTER — Encounter (HOSPITAL_COMMUNITY): Payer: Self-pay | Admitting: Emergency Medicine

## 2015-04-11 DIAGNOSIS — Z880 Allergy status to penicillin: Secondary | ICD-10-CM | POA: Insufficient documentation

## 2015-04-11 DIAGNOSIS — I10 Essential (primary) hypertension: Secondary | ICD-10-CM | POA: Insufficient documentation

## 2015-04-11 DIAGNOSIS — E669 Obesity, unspecified: Secondary | ICD-10-CM | POA: Insufficient documentation

## 2015-04-11 DIAGNOSIS — Y998 Other external cause status: Secondary | ICD-10-CM | POA: Insufficient documentation

## 2015-04-11 DIAGNOSIS — W1839XA Other fall on same level, initial encounter: Secondary | ICD-10-CM | POA: Diagnosis not present

## 2015-04-11 DIAGNOSIS — Z79899 Other long term (current) drug therapy: Secondary | ICD-10-CM | POA: Insufficient documentation

## 2015-04-11 DIAGNOSIS — E785 Hyperlipidemia, unspecified: Secondary | ICD-10-CM | POA: Diagnosis not present

## 2015-04-11 DIAGNOSIS — Z9889 Other specified postprocedural states: Secondary | ICD-10-CM | POA: Insufficient documentation

## 2015-04-11 DIAGNOSIS — Z7951 Long term (current) use of inhaled steroids: Secondary | ICD-10-CM | POA: Diagnosis not present

## 2015-04-11 DIAGNOSIS — G8929 Other chronic pain: Secondary | ICD-10-CM | POA: Insufficient documentation

## 2015-04-11 DIAGNOSIS — Y92009 Unspecified place in unspecified non-institutional (private) residence as the place of occurrence of the external cause: Secondary | ICD-10-CM | POA: Insufficient documentation

## 2015-04-11 DIAGNOSIS — M1990 Unspecified osteoarthritis, unspecified site: Secondary | ICD-10-CM | POA: Diagnosis not present

## 2015-04-11 DIAGNOSIS — F17210 Nicotine dependence, cigarettes, uncomplicated: Secondary | ICD-10-CM | POA: Insufficient documentation

## 2015-04-11 DIAGNOSIS — S3992XA Unspecified injury of lower back, initial encounter: Secondary | ICD-10-CM | POA: Diagnosis not present

## 2015-04-11 DIAGNOSIS — J449 Chronic obstructive pulmonary disease, unspecified: Secondary | ICD-10-CM | POA: Diagnosis not present

## 2015-04-11 DIAGNOSIS — W19XXXA Unspecified fall, initial encounter: Secondary | ICD-10-CM

## 2015-04-11 DIAGNOSIS — Y9389 Activity, other specified: Secondary | ICD-10-CM | POA: Diagnosis not present

## 2015-04-11 DIAGNOSIS — F418 Other specified anxiety disorders: Secondary | ICD-10-CM | POA: Insufficient documentation

## 2015-04-11 DIAGNOSIS — M545 Low back pain: Secondary | ICD-10-CM | POA: Diagnosis not present

## 2015-04-11 DIAGNOSIS — F319 Bipolar disorder, unspecified: Secondary | ICD-10-CM | POA: Insufficient documentation

## 2015-04-11 DIAGNOSIS — Z87448 Personal history of other diseases of urinary system: Secondary | ICD-10-CM | POA: Diagnosis not present

## 2015-04-11 DIAGNOSIS — M5440 Lumbago with sciatica, unspecified side: Secondary | ICD-10-CM | POA: Insufficient documentation

## 2015-04-11 DIAGNOSIS — Z862 Personal history of diseases of the blood and blood-forming organs and certain disorders involving the immune mechanism: Secondary | ICD-10-CM | POA: Diagnosis not present

## 2015-04-11 MED ORDER — TRAMADOL HCL 50 MG PO TABS: 50.0000 mg | ORAL_TABLET | Freq: Four times a day (QID) | ORAL | Status: DC | PRN

## 2015-04-11 MED ORDER — TRAMADOL HCL 50 MG PO TABS
50.0000 mg | ORAL_TABLET | Freq: Once | ORAL | Status: AC
  Administered 2015-04-11: 50 mg via ORAL
  Filled 2015-04-11: qty 1

## 2015-04-11 MED ORDER — KETOROLAC TROMETHAMINE 60 MG/2ML IM SOLN
60.0000 mg | Freq: Once | INTRAMUSCULAR | Status: AC
  Administered 2015-04-11: 60 mg via INTRAMUSCULAR
  Filled 2015-04-11: qty 2

## 2015-04-11 MED ORDER — PREDNISONE 10 MG PO TABS: ORAL_TABLET | ORAL | Status: DC

## 2015-04-11 NOTE — ED Notes (Signed)
PT states chronic lower back pain agitated yesterday after falling on the ramp to her house. PT states pain radiates down bilateral legs. PT states she has an appointment with a spine doctor on 04/26/15.

## 2015-04-11 NOTE — Discharge Instructions (Signed)
Chronic Back Pain ° When back pain lasts longer than 3 months, it is called chronic back pain. People with chronic back pain often go through certain periods that are more intense (flare-ups).  °CAUSES °Chronic back pain can be caused by wear and tear (degeneration) on different structures in your back. These structures include: °· The bones of your spine (vertebrae) and the joints surrounding your spinal cord and nerve roots (facets). °· The strong, fibrous tissues that connect your vertebrae (ligaments). °Degeneration of these structures may result in pressure on your nerves. This can lead to constant pain. °HOME CARE INSTRUCTIONS °· Avoid bending, heavy lifting, prolonged sitting, and activities which make the problem worse. °· Take brief periods of rest throughout the day to reduce your pain. Lying down or standing usually is better than sitting while you are resting. °· Take over-the-counter or prescription medicines only as directed by your caregiver. °SEEK IMMEDIATE MEDICAL CARE IF:  °· You have weakness or numbness in one of your legs or feet. °· You have trouble controlling your bladder or bowels. °· You have nausea, vomiting, abdominal pain, shortness of breath, or fainting. °  °This information is not intended to replace advice given to you by your health care provider. Make sure you discuss any questions you have with your health care provider. °  °Document Released: 05/17/2004 Document Revised: 07/02/2011 Document Reviewed: 09/27/2014 °Elsevier Interactive Patient Education ©2016 [employer] Inc. ° °Sciatica °Sciatica is pain, weakness, numbness, or tingling along the path of the sciatic nerve. The nerve starts in the lower back and runs down the back of each leg. The nerve controls the muscles in the lower leg and in the back of the knee, while also providing sensation to the back of the thigh, lower leg, and the sole of your foot. Sciatica is a symptom of another medical condition. For instance, nerve  damage or certain conditions, such as a herniated disk or bone spur on the spine, pinch or put pressure on the sciatic nerve. This causes the pain, weakness, or other sensations normally associated with sciatica. Generally, sciatica only affects one side of the body. °CAUSES  °· Herniated or slipped disc. °· Degenerative disk disease. °· A pain disorder involving the narrow muscle in the buttocks (piriformis syndrome). °· Pelvic injury or fracture. °· Pregnancy. °· Tumor (rare). °SYMPTOMS  °Symptoms can vary from mild to very severe. The symptoms usually travel from the low back to the buttocks and down the back of the leg. Symptoms can include: °· Mild tingling or dull aches in the lower back, leg, or hip. °· Numbness in the back of the calf or sole of the foot. °· Burning sensations in the lower back, leg, or hip. °· Sharp pains in the lower back, leg, or hip. °· Leg weakness. °· Severe back pain inhibiting movement. °These symptoms may get worse with coughing, sneezing, laughing, or prolonged sitting or standing. Also, being overweight may worsen symptoms. °DIAGNOSIS  °Your caregiver will perform a physical exam to look for common symptoms of sciatica. He or she may ask you to do certain movements or activities that would trigger sciatic nerve pain. Other tests may be performed to find the cause of the sciatica. These may include: °· Blood tests. °· X-rays. °· Imaging tests, such as an MRI or CT scan. °TREATMENT  °Treatment is directed at the cause of the sciatic pain. Sometimes, treatment is not necessary and the pain and discomfort goes away on its own. If treatment is needed, your   caregiver may suggest: °· Over-the-counter medicines to relieve pain. °· Prescription medicines, such as anti-inflammatory medicine, muscle relaxants, or narcotics. °· Applying heat or ice to the painful area. °· Steroid injections to lessen pain, irritation, and inflammation around the nerve. °· Reducing activity during periods of  pain. °· Exercising and stretching to strengthen your abdomen and improve flexibility of your spine. Your caregiver may suggest losing weight if the extra weight makes the back pain worse. °· Physical therapy. °· Surgery to eliminate what is pressing or pinching the nerve, such as a bone spur or part of a herniated disk. °HOME CARE INSTRUCTIONS  °· Only take over-the-counter or prescription medicines for pain or discomfort as directed by your caregiver. °· Apply ice to the affected area for 20 minutes, 3-4 times a day for the first 48-72 hours. Then try heat in the same way. °· Exercise, stretch, or perform your usual activities if these do not aggravate your pain. °· Attend physical therapy sessions as directed by your caregiver. °· Keep all follow-up appointments as directed by your caregiver. °· Do not wear high heels or shoes that do not provide proper support. °· Check your mattress to see if it is too soft. A firm mattress may lessen your pain and discomfort. °SEEK IMMEDIATE MEDICAL CARE IF:  °· You lose control of your bowel or bladder (incontinence). °· You have increasing weakness in the lower back, pelvis, buttocks, or legs. °· You have redness or swelling of your back. °· You have a burning sensation when you urinate. °· You have pain that gets worse when you lie down or awakens you at night. °· Your pain is worse than you have experienced in the past. °· Your pain is lasting longer than 4 weeks. °· You are suddenly losing weight without reason. °MAKE SURE YOU: °· Understand these instructions. °· Will watch your condition. °· Will get help right away if you are not doing well or get worse. °  °This information is not intended to replace advice given to you by your health care provider. Make sure you discuss any questions you have with your health care provider. °  °Document Released: 04/03/2001 Document Revised: 12/29/2014 Document Reviewed: 08/19/2011 °Elsevier Interactive Patient Education ©2016  [employer]. ° °

## 2015-04-11 NOTE — ED Provider Notes (Signed)
CSN: [id number]     Arrival date & time 04/11/15  1102 History  By signing my name below, I, Erling Conte, attest that this documentation has been prepared under the direction and in the presence of Evalee Jefferson, PA-C Electronically Signed: Erling Conte, ED Scribe. 04/12/2015. 11:03 PM.    Chief Complaint  Patient presents with  . Back Pain   The history is provided by the patient. No language interpreter was used.    HPI Comments: Brooke Maddox is a 44 y.o. female with a h/o HTN, arthritis, seizures, bipolar affective disorder and chronic pain who presents to the Emergency Department complaining of worsening of her chronic lower back pain s/p fall at her home yesterday. She reports a mechanical fall onto her back while going up the ramp to her house. She states the pain radiates down into her bilateral legs. She endorses throbbing pain in her b/l ankles s/p a prior fall 1 month ago as well.  She had imaging done at that visit which showed "anterolisthesis L5 on S1 vertebral body. Disc space flattening at L5-S1 level. Bilateral pars defect at L5 level." She notes she has an appt with a spine specialist, Dr. Elnoria Howard on 04/26/15. Pt states the pain has been keeping her up at night. Pt denies any alleviating/aggravating factors. Pt denies any numbness or weakness in her lower extremities, urinary/bowel incontinence.  She has taken ibuprofen without relief of symptoms.   Past Medical History  Diagnosis Date  . Hypertension   . Arthritis   . Depression   . Anxiety associated with depression 2007    hospitalised for mental health problems   . Seizures ([place])     Dr. Theda Sers   . Migraines   . Chronic back pain   . History of seizure disorder   . History of leukocytosis   . Bipolar affective disorder ([place])   . Osteoarthritis     mild   . Abdominal pain, acute, left lower quadrant   . Chronic constipation   . Nausea   . Nicotine addiction   . Obesity   . Fatigue   . Acute knee pain     right    . Hyperlipidemia   . COPD (chronic obstructive pulmonary disease) ([place])   . Collagen vascular disease ([place])   . Renal insufficiency   . Lumbar radiculopathy     left  . Chronic leg pain     left  . Pain management   . Leukocytosis 10/22/2014   Past Surgical History  Procedure Laterality Date  . Vesicovaginal fistula closure w/ tah  2009  . Abdominal hysterectomy    . Left heart catheterization with coronary angiogram N/A 03/17/2013    Procedure: LEFT HEART CATHETERIZATION WITH CORONARY ANGIOGRAM;  Surgeon: Peter M Martinique, MD;  [facility] C[facility] LAB;  Service: Cardiovascular;  Laterality: N/A;   Family History  Problem Relation Age of Onset  . COPD Father   . Hypertension Father   . Arthritis Brother    Social History  Substance Use Topics  . Smoking status: Current Every Day Smoker -- 0.50 packs/day for 35 years    Types: Cigarettes    Start date: 08/30/1986  . Smokeless tobacco: Never Used  . Alcohol Use: No     Comment: for 11 years states that she has quit    OB History    Gravida Para Term Preterm AB TAB SAB Ectopic Multiple Living   2 2 2  Review of Systems  Constitutional: Negative for fever.  Respiratory: Negative for shortness of breath.   Cardiovascular: Negative for chest pain and leg swelling.  Gastrointestinal: Negative for abdominal pain, constipation and abdominal distention.  Genitourinary: Negative for dysuria, urgency, frequency, flank pain and difficulty urinating.  Musculoskeletal: Positive for back pain. Negative for joint swelling and gait problem.  Skin: Negative for rash.  Neurological: Negative for weakness and numbness.      Allergies  Dilaudid; Lithium carbonate; Morphine and related; Sulfur; Tylenol; Wellbutrin; Codeine; Divalproex sodium; Fentanyl; Iodine; Iohexol; and Penicillins  Home Medications   Prior to Admission medications   Medication Sig Start Date End Date Taking? Authorizing Provider  ALPRAZolam Duanne Moron) 0.5 MG  tablet Take 0.5 mg by mouth 5 (five) times daily.    Historical Provider, MD  azelastine (ASTELIN) 0.1 % nasal spray Place 2 sprays into both nostrils 2 (two) times daily. Use in each nostril as directed 02/21/15   Fayrene Helper, MD  beclomethasone (QVAR) 40 MCG/ACT inhaler Inhale 1 puff into the lungs 2 (two) times daily. 01/10/15   Fayrene Helper, MD  cloNIDine (CATAPRES) 0.1 MG tablet TAKE 1 TABLET BY MOUTH EVERY NIGHT AT BEDTIME 01/10/15   Fayrene Helper, MD  fluconazole (DIFLUCAN) 150 MG tablet Take 1 tablet (150 mg total) by mouth once. Patient not taking: Reported on 03/19/2015 02/21/15   Fayrene Helper, MD  FLUoxetine (PROZAC) 20 MG capsule TAKE 3 CAPSULES BY MOUTH EVERY MORNING 11/08/14   Historical Provider, MD  fluticasone (FLONASE) 50 MCG/ACT nasal spray Place 2 sprays into both nostrils daily. 02/21/15   Fayrene Helper, MD  gabapentin (NEURONTIN) 300 MG capsule Take 1 capsule (300 mg total) by mouth 2 (two) times daily. Patient not taking: Reported on 03/19/2015 01/13/15   Merryl Hacker, MD  ibuprofen (ADVIL,MOTRIN) 200 MG tablet Take 400 mg by mouth every 6 (six) hours as needed for headache.    Historical Provider, MD  lamoTRIgine (LAMICTAL) 150 MG tablet Take 300 mg by mouth daily.    Historical Provider, MD  levofloxacin (LEVAQUIN) 500 MG tablet Take 1 tablet (500 mg total) by mouth daily. Patient not taking: Reported on 03/19/2015 02/21/15   Fayrene Helper, MD  levothyroxine (SYNTHROID, LEVOTHROID) 25 MCG tablet Take 1 tablet (25 mcg total) by mouth daily. 02/21/15   Fayrene Helper, MD  methocarbamol (ROBAXIN) 500 MG tablet Take 1 tablet (500 mg total) by mouth 3 (three) times daily. 03/19/15   Tammy Triplett, PA-C  montelukast (SINGULAIR) 10 MG tablet Take 1 tablet (10 mg total) by mouth at bedtime. 02/21/15   Fayrene Helper, MD  nitroGLYCERIN (NITROSTAT) 0.4 MG SL tablet Place 1 tablet (0.4 mg total) under the tongue every 5 (five) minutes as  needed for chest pain. 08/23/14   Arnoldo Lenis, MD  oxyCODONE-acetaminophen (PERCOCET/ROXICET) 5-325 MG tablet Take 1 tablet by mouth every 4 (four) hours as needed. 03/19/15   Tammy Triplett, PA-C  predniSONE (DELTASONE) 10 MG tablet 6, 5, 4, 3, 2 then 1 tablet by mouth daily for 6 days total. 04/11/15   Evalee Jefferson, PA-C  rizatriptan (MAXALT-MLT) 5 MG disintegrating tablet Take 5 mg by mouth as needed for migraine. May repeat in 2 hours if needed    Historical Provider, MD  rosuvastatin (CRESTOR) 20 MG tablet TAKE 1 TABLET BY MOUTH EVERY NIGHT AT BEDTIME 01/10/15   Fayrene Helper, MD  topiramate (TOPAMAX) 25 MG tablet Take 50 mg by mouth daily.  Historical Provider, MD  traMADol (ULTRAM) 50 MG tablet Take 1 tablet (50 mg total) by mouth every 6 (six) hours as needed. 04/11/15   Evalee Jefferson, PA-C   Triage Vitals: BP 122/80 mmHg  Pulse 95  Temp(Src) 97.6 F (36.4 C) (Oral)  Resp 18  Ht 5\' 2"  (1.575 m)  Wt 165 lb (74.844 kg)  BMI 30.17 kg/m2  SpO2 97%  Physical Exam  Constitutional: She appears well-developed and well-nourished.  HENT:  Head: Normocephalic.  Eyes: Conjunctivae are normal.  Neck: Normal range of motion. Neck supple.  Cardiovascular: Normal rate and intact distal pulses.   Pedal pulses normal.  Pulmonary/Chest: Effort normal.  Abdominal: Soft. Bowel sounds are normal. She exhibits no distension and no mass.  Musculoskeletal: Normal range of motion. She exhibits no edema.       Lumbar back: She exhibits tenderness. She exhibits no swelling, no edema and no spasm.  Midline TTP at her lumbar/sacral spine, no palpable deformity or stepoffs  Neurological: She is alert. She has normal strength and normal reflexes. She displays no atrophy and no tremor. No sensory deficit. Gait normal.  Reflex Scores:      Patellar reflexes are 2+ on the right side and 2+ on the left side.      Achilles reflexes are 2+ on the right side and 2+ on the left side. No strength deficit  noted in hip and knee flexor and extensor muscle groups.  Ankle flexion and extension intact.  Skin: Skin is warm and dry.  Psychiatric: She has a normal mood and affect.  Nursing note and vitals reviewed.   ED Course  Procedures (including critical care time)  DIAGNOSTIC STUDIES: Oxygen Saturation is 97% on RA, normal by my interpretation.    COORDINATION OF CARE: 12:44 PM- Will order imaging of lumbar spine and Toradol injection.  Pt advised of plan for treatment and pt agrees.  Labs Review Labs Reviewed - No data to display  Imaging Review Dg Lumbar Spine Complete  04/11/2015  CLINICAL DATA:  [place] backwards walking up a ramp yesterday at home landing on buttocks. Pain midline lower back and lower extremities. EXAM: LUMBAR SPINE - COMPLETE 4+ VIEW COMPARISON:  03/19/2015 and 01/13/2015 as well as CT 12/06/2014 FINDINGS: Examination demonstrates minimal spondylosis of the lumbar spine. Vertebral body heights are maintained without evidence of compression fracture. There is continued grade 1 anterolisthesis of L5 on S1 likely due to bilateral L5 spondylolysis. Moderate disc space narrowing at the L5-S1 level unchanged. IMPRESSION: Minimal spondylosis of the lumbar spine. Stable disc disease at the L5-S1 level. Stable grade 1 anterolisthesis of L5 on S1 due to bilateral L5 spondylolysis. Electronically Signed   By: Marin Olp M.D.   On: 04/11/2015 12:59   I have personally reviewed and evaluated these images as part of my medical decision-making.   EKG Interpretation None      MDM   Final diagnoses:  Fall, initial encounter  Midline low back pain with sciatica, sciatica laterality unspecified    .  Radiological studies were viewed, interpreted and considered during the medical decision making and disposition process. I agree with radiologists reading.  Results were also discussed with patient.  Imaging stable from prior visit. No neuro deficit on exam or by history to suggest  emergent or surgical presentation.  Prescribed prednisone taper, tramadol.  Discussed worsened sx that should prompt immediate re-evaluation including distal weakness, bowel/bladder retention/incontinence.   I personally performed the services described in this documentation, which was  scribed in my presence. The recorded information has been reviewed and is accurate.     Evalee Jefferson, PA-C 04/12/15 [id number]  Noemi Chapel, MD 04/13/15 [phone]

## 2015-04-13 ENCOUNTER — Telehealth: Payer: Self-pay | Admitting: Family Medicine

## 2015-04-13 NOTE — Telephone Encounter (Signed)
OPENED IN ERROR

## 2015-04-13 NOTE — Telephone Encounter (Signed)
Patient is calling stating that she is on 2 inhalers and 1 is out of date and she cant figure out the name of it she states that it is not the Qvar please advise?

## 2015-04-14 NOTE — Telephone Encounter (Signed)
The only inhaler I see on her list is the qvar and I even called the pharmacy and they only have her on the qvar as well. Will notify patient when she calls back. Called patient and no answer and her mailbox was full and could not accept messages.

## 2015-04-20 ENCOUNTER — Telehealth: Payer: Self-pay | Admitting: Family Medicine

## 2015-04-20 NOTE — Telephone Encounter (Signed)
Patient is asking for a returned call from the nurse, she thinks she may have the shingles, please advise?

## 2015-04-20 NOTE — Telephone Encounter (Signed)
Mailbox is full and cannot accept messages. 

## 2015-04-24 HISTORY — PX: POSTERIOR LUMBAR FUSION: SHX6036

## 2015-04-27 NOTE — Telephone Encounter (Signed)
Doesn't ring- goes straight to msg stating voicemail full

## 2015-05-24 DIAGNOSIS — F319 Bipolar disorder, unspecified: Secondary | ICD-10-CM | POA: Diagnosis not present

## 2015-05-26 ENCOUNTER — Other Ambulatory Visit: Payer: Self-pay | Admitting: Family Medicine

## 2015-06-08 DIAGNOSIS — J069 Acute upper respiratory infection, unspecified: Secondary | ICD-10-CM | POA: Diagnosis not present

## 2015-06-08 DIAGNOSIS — J329 Chronic sinusitis, unspecified: Secondary | ICD-10-CM | POA: Diagnosis not present

## 2015-06-08 DIAGNOSIS — J40 Bronchitis, not specified as acute or chronic: Secondary | ICD-10-CM | POA: Diagnosis not present

## 2015-06-08 DIAGNOSIS — J029 Acute pharyngitis, unspecified: Secondary | ICD-10-CM | POA: Diagnosis not present

## 2015-06-22 ENCOUNTER — Other Ambulatory Visit: Payer: Self-pay | Admitting: Family Medicine

## 2015-06-30 ENCOUNTER — Telehealth: Payer: Self-pay | Admitting: Family Medicine

## 2015-06-30 DIAGNOSIS — M1990 Unspecified osteoarthritis, unspecified site: Secondary | ICD-10-CM | POA: Diagnosis not present

## 2015-06-30 DIAGNOSIS — Z79899 Other long term (current) drug therapy: Secondary | ICD-10-CM | POA: Diagnosis not present

## 2015-06-30 DIAGNOSIS — G894 Chronic pain syndrome: Secondary | ICD-10-CM | POA: Diagnosis not present

## 2015-06-30 DIAGNOSIS — M545 Low back pain: Secondary | ICD-10-CM | POA: Diagnosis not present

## 2015-06-30 DIAGNOSIS — I10 Essential (primary) hypertension: Secondary | ICD-10-CM | POA: Diagnosis not present

## 2015-06-30 DIAGNOSIS — F319 Bipolar disorder, unspecified: Secondary | ICD-10-CM | POA: Diagnosis not present

## 2015-06-30 DIAGNOSIS — F603 Borderline personality disorder: Secondary | ICD-10-CM | POA: Diagnosis not present

## 2015-06-30 DIAGNOSIS — M533 Sacrococcygeal disorders, not elsewhere classified: Secondary | ICD-10-CM | POA: Diagnosis not present

## 2015-06-30 DIAGNOSIS — G43901 Migraine, unspecified, not intractable, with status migrainosus: Secondary | ICD-10-CM | POA: Diagnosis not present

## 2015-06-30 NOTE — Telephone Encounter (Signed)
Called pt no answer and no option to leave message

## 2015-06-30 NOTE — Telephone Encounter (Signed)
Patient is asking to speak to [facility]

## 2015-06-30 NOTE — Telephone Encounter (Signed)
Scheduled an appt

## 2015-07-02 ENCOUNTER — Emergency Department (HOSPITAL_COMMUNITY): Payer: Medicare Other

## 2015-07-02 ENCOUNTER — Encounter (HOSPITAL_COMMUNITY): Payer: Self-pay | Admitting: *Deleted

## 2015-07-02 ENCOUNTER — Emergency Department (HOSPITAL_COMMUNITY)
Admission: EM | Admit: 2015-07-02 | Discharge: 2015-07-02 | Disposition: A | Discharge status: Home / Self Care | Payer: Medicare Other | Attending: Emergency Medicine | Admitting: Emergency Medicine

## 2015-07-02 DIAGNOSIS — Z791 Long term (current) use of non-steroidal anti-inflammatories (NSAID): Secondary | ICD-10-CM | POA: Diagnosis not present

## 2015-07-02 DIAGNOSIS — Y92009 Unspecified place in unspecified non-institutional (private) residence as the place of occurrence of the external cause: Secondary | ICD-10-CM | POA: Insufficient documentation

## 2015-07-02 DIAGNOSIS — F329 Major depressive disorder, single episode, unspecified: Secondary | ICD-10-CM | POA: Insufficient documentation

## 2015-07-02 DIAGNOSIS — E669 Obesity, unspecified: Secondary | ICD-10-CM | POA: Diagnosis not present

## 2015-07-02 DIAGNOSIS — E785 Hyperlipidemia, unspecified: Secondary | ICD-10-CM | POA: Insufficient documentation

## 2015-07-02 DIAGNOSIS — F319 Bipolar disorder, unspecified: Secondary | ICD-10-CM | POA: Insufficient documentation

## 2015-07-02 DIAGNOSIS — M545 Low back pain: Secondary | ICD-10-CM | POA: Diagnosis not present

## 2015-07-02 DIAGNOSIS — I10 Essential (primary) hypertension: Secondary | ICD-10-CM | POA: Insufficient documentation

## 2015-07-02 DIAGNOSIS — F17210 Nicotine dependence, cigarettes, uncomplicated: Secondary | ICD-10-CM | POA: Diagnosis not present

## 2015-07-02 DIAGNOSIS — G8929 Other chronic pain: Secondary | ICD-10-CM | POA: Diagnosis not present

## 2015-07-02 DIAGNOSIS — Z79899 Other long term (current) drug therapy: Secondary | ICD-10-CM | POA: Diagnosis not present

## 2015-07-02 DIAGNOSIS — W19XXXA Unspecified fall, initial encounter: Secondary | ICD-10-CM

## 2015-07-02 DIAGNOSIS — Y999 Unspecified external cause status: Secondary | ICD-10-CM | POA: Diagnosis not present

## 2015-07-02 DIAGNOSIS — M25551 Pain in right hip: Secondary | ICD-10-CM | POA: Diagnosis not present

## 2015-07-02 DIAGNOSIS — W1839XA Other fall on same level, initial encounter: Secondary | ICD-10-CM | POA: Diagnosis not present

## 2015-07-02 DIAGNOSIS — Y9301 Activity, walking, marching and hiking: Secondary | ICD-10-CM | POA: Diagnosis not present

## 2015-07-02 DIAGNOSIS — J449 Chronic obstructive pulmonary disease, unspecified: Secondary | ICD-10-CM | POA: Diagnosis not present

## 2015-07-02 MED ORDER — ONDANSETRON 4 MG PO TBDP
4.0000 mg | ORAL_TABLET | Freq: Once | ORAL | Status: AC
  Administered 2015-07-02: 4 mg via ORAL
  Filled 2015-07-02: qty 1

## 2015-07-02 MED ORDER — ONDANSETRON 4 MG PO TBDP: 4.0000 mg | ORAL_TABLET | Freq: Three times a day (TID) | ORAL | Status: DC | PRN

## 2015-07-02 MED ORDER — HYDROCODONE-ACETAMINOPHEN 5-325 MG PO TABS: 1.0000 | ORAL_TABLET | Freq: Four times a day (QID) | ORAL | Status: DC | PRN

## 2015-07-02 MED ORDER — HYDROCODONE-ACETAMINOPHEN 5-325 MG PO TABS
2.0000 | ORAL_TABLET | Freq: Once | ORAL | Status: AC
  Administered 2015-07-02: 2 via ORAL
  Filled 2015-07-02: qty 2

## 2015-07-02 NOTE — Discharge Instructions (Signed)
Chronic Back Pain  When back pain lasts longer than 3 months, it is called chronic back pain.People with chronic back pain often go through certain periods that are more intense (flare-ups).  CAUSES Chronic back pain can be caused by wear and tear (degeneration) on different structures in your back. These structures include:  The bones of your spine (vertebrae) and the joints surrounding your spinal cord and nerve roots (facets).  The strong, fibrous tissues that connect your vertebrae (ligaments). Degeneration of these structures may result in pressure on your nerves. This can lead to constant pain. HOME CARE INSTRUCTIONS  Avoid bending, heavy lifting, prolonged sitting, and activities which make the problem worse.  Take brief periods of rest throughout the day to reduce your pain. Lying down or standing usually is better than sitting while you are resting.  Take over-the-counter or prescription medicines only as directed by your caregiver. SEEK IMMEDIATE MEDICAL CARE IF:   You have weakness or numbness in one of your legs or feet.  You have trouble controlling your bladder or bowels.  You have nausea, vomiting, abdominal pain, shortness of breath, or fainting.   This information is not intended to replace advice given to you by your health care provider. Make sure you discuss any questions you have with your health care provider.   Document Released: 05/17/2004 Document Revised: 07/02/2011 Document Reviewed: 09/27/2014 Elsevier Interactive Patient Education 2016 [employer]  Hip Pain Your hip is the joint between your upper legs and your lower pelvis. The bones, cartilage, tendons, and muscles of your hip joint perform a lot of work each day supporting your body weight and allowing you to move around. Hip pain can range from a minor ache to severe pain in one or both of your hips. Pain may be felt on the inside of the hip joint near the groin, or the outside near the buttocks and  upper thigh. You may have swelling or stiffness as well.  HOME CARE INSTRUCTIONS   Take medicines only as directed by your health care provider.  Apply ice to the injured area:  Put ice in a plastic bag.  Place a towel between your skin and the bag.  Leave the ice on for 15-20 minutes at a time, 3-4 times a day.  Keep your leg raised (elevated) when possible to lessen swelling.  Avoid activities that cause pain.  Follow specific exercises as directed by your health care provider.  Sleep with a pillow between your legs on your most comfortable side.  Record how often you have hip pain, the location of the pain, and what it feels like. SEEK MEDICAL CARE IF:   You are unable to put weight on your leg.  Your hip is red or swollen or very tender to touch.  Your pain or swelling continues or worsens after 1 week.  You have increasing difficulty walking.  You have a fever. SEEK IMMEDIATE MEDICAL CARE IF:   You have fallen.  You have a sudden increase in pain and swelling in your hip. MAKE SURE YOU:   Understand these instructions.  Will watch your condition.  Will get help right away if you are not doing well or get worse.   This information is not intended to replace advice given to you by your health care provider. Make sure you discuss any questions you have with your health care provider.   Document Released: 09/27/2009 Document Revised: 04/30/2014 Document Reviewed: 12/04/2012 Elsevier Interactive Patient Education [employer].

## 2015-07-02 NOTE — ED Notes (Signed)
Pt c/o fall tonight landing on her right hip. Pt c/o lower right sided back pain that radiates down her right leg.

## 2015-07-02 NOTE — ED Provider Notes (Signed)
TIME SEEN: 2:30 AM  CHIEF COMPLAINT: Fall, right hip and right lower back pain  HPI: Pt is a 45 y.o. female with history of hypertension, migraines, seizures on Topamax and Lamictal, chronic back pain who presents to the emergency department complaints of a fall tonight. States that because of her back pain she ambulates with a walker. She is followed by Dr. Merlene Laughter with neurology who she reports is trying to find her a neurosurgeon. States that her walker got caught in a drop cord that was laying in her house tonight. She reports that the walker went forward and she fell backwards landing on her right hip and right lower back. Complaining of worsening of her chronic back pain and new right hip pain. Denies hitting her head or losing consciousness. States she is not on anticoagulation. States she does have chronic right lower extremity pain but the hip pain is new. Reports she has chronic left lower extremity numbness. No new numbness or focal weakness. No bowel or bladder incontinence. States she is not on pain medication at home.  ROS: See HPI Constitutional: no fever  Eyes: no drainage  ENT: no runny nose   Cardiovascular:  no chest pain  Resp: no SOB  GI: no vomiting GU: no dysuria Integumentary: no rash  Allergy: no hives  Musculoskeletal: no leg swelling  Neurological: no slurred speech ROS otherwise negative  PAST MEDICAL HISTORY/PAST SURGICAL HISTORY:  Past Medical History  Diagnosis Date  . Hypertension   . Arthritis   . Depression   . Anxiety associated with depression 2007    hospitalised for mental health problems   . Seizures ([place])     Dr. Theda Sers   . Migraines   . Chronic back pain   . History of seizure disorder   . History of leukocytosis   . Bipolar affective disorder ([place])   . Osteoarthritis     mild   . Abdominal pain, acute, left lower quadrant   . Chronic constipation   . Nausea   . Nicotine addiction   . Obesity   . Fatigue   . Acute knee pain      right   . Hyperlipidemia   . COPD (chronic obstructive pulmonary disease) ([place])   . Collagen vascular disease ([place])   . Renal insufficiency   . Lumbar radiculopathy     left  . Chronic leg pain     left  . Pain management   . Leukocytosis 10/22/2014    MEDICATIONS:  Prior to Admission medications   Medication Sig Start Date End Date Taking? Authorizing Provider  ALPRAZolam Duanne Moron) 0.5 MG tablet Take 0.5 mg by mouth 5 (five) times daily.    Historical Provider, MD  azelastine (ASTELIN) 0.1 % nasal spray Place 2 sprays into both nostrils 2 (two) times daily. Use in each nostril as directed 02/21/15   Fayrene Helper, MD  beclomethasone (QVAR) 40 MCG/ACT inhaler Inhale 1 puff into the lungs 2 (two) times daily. 01/10/15   Fayrene Helper, MD  cloNIDine (CATAPRES) 0.1 MG tablet TAKE 1 TABLET BY MOUTH EVERY NIGHT AT BEDTIME 06/24/15   Fayrene Helper, MD  fluconazole (DIFLUCAN) 150 MG tablet Take 1 tablet (150 mg total) by mouth once. Patient not taking: Reported on 03/19/2015 02/21/15   Fayrene Helper, MD  FLUoxetine (PROZAC) 20 MG capsule TAKE 3 CAPSULES BY MOUTH EVERY MORNING 11/08/14   Historical Provider, MD  fluticasone (FLONASE) 50 MCG/ACT nasal spray Place 2 sprays into both nostrils  daily. 02/21/15   Fayrene Helper, MD  gabapentin (NEURONTIN) 300 MG capsule Take 1 capsule (300 mg total) by mouth 2 (two) times daily. Patient not taking: Reported on 03/19/2015 01/13/15   Merryl Hacker, MD  ibuprofen (ADVIL,MOTRIN) 200 MG tablet Take 400 mg by mouth every 6 (six) hours as needed for headache.    Historical Provider, MD  lamoTRIgine (LAMICTAL) 150 MG tablet Take 300 mg by mouth daily.    Historical Provider, MD  levofloxacin (LEVAQUIN) 500 MG tablet Take 1 tablet (500 mg total) by mouth daily. Patient not taking: Reported on 03/19/2015 02/21/15   Fayrene Helper, MD  levothyroxine (SYNTHROID, LEVOTHROID) 25 MCG tablet Take 1 tablet (25 mcg total) by mouth daily.  02/21/15   Fayrene Helper, MD  methocarbamol (ROBAXIN) 500 MG tablet Take 1 tablet (500 mg total) by mouth 3 (three) times daily. 03/19/15   Tammy Triplett, PA-C  montelukast (SINGULAIR) 10 MG tablet Take 1 tablet (10 mg total) by mouth at bedtime. 02/21/15   Fayrene Helper, MD  nitroGLYCERIN (NITROSTAT) 0.4 MG SL tablet Place 1 tablet (0.4 mg total) under the tongue every 5 (five) minutes as needed for chest pain. 08/23/14   Arnoldo Lenis, MD  oxyCODONE-acetaminophen (PERCOCET/ROXICET) 5-325 MG tablet Take 1 tablet by mouth every 4 (four) hours as needed. 03/19/15   Tammy Triplett, PA-C  predniSONE (DELTASONE) 10 MG tablet 6, 5, 4, 3, 2 then 1 tablet by mouth daily for 6 days total. 04/11/15   Evalee Jefferson, PA-C  QVAR 40 MCG/ACT inhaler INHALE 1 PUFF BY MOUTH INTO THE LUNGS TWICE DAILY 05/30/15   Fayrene Helper, MD  rizatriptan (MAXALT-MLT) 5 MG disintegrating tablet Take 5 mg by mouth as needed for migraine. May repeat in 2 hours if needed    Historical Provider, MD  rosuvastatin (CRESTOR) 20 MG tablet TAKE 1 TABLET BY MOUTH EVERY NIGHT AT BEDTIME 01/10/15   Fayrene Helper, MD  topiramate (TOPAMAX) 25 MG tablet Take 50 mg by mouth daily.    Historical Provider, MD  traMADol (ULTRAM) 50 MG tablet Take 1 tablet (50 mg total) by mouth every 6 (six) hours as needed. 04/11/15   Evalee Jefferson, PA-C    ALLERGIES:  Allergies  Allergen Reactions  . Dilaudid [Hydromorphone]     Gives patient a huge headache  . Lithium Carbonate Other (See Comments)    Shakes   . Morphine And Related Other (See Comments)    Extended Drowsiness-headache  . Sulfur     Rash   . Tylenol [Acetaminophen]     Pt states that regular tylenol makes her sick but she can take tylenol when mixed with another medication such as percocet or Vicodin she does not get sick,   . Wellbutrin [Bupropion] Other (See Comments)    H/o seizure d/o in medical record   . Codeine Nausea And Vomiting  . Divalproex Sodium Rash  .  Fentanyl Rash  . Iodine Rash  . Iohexol Rash  . Penicillins Nausea And Vomiting and Rash    Has patient had a PCN reaction causing immediate rash, facial/tongue/throat swelling, SOB or lightheadedness with hypotension: No Has patient had a PCN reaction causing severe rash involving mucus membranes or skin necrosis: No Has patient had a PCN reaction that required hospitalization: no Has patient had a PCN reaction occurring within the last 10 years: yes If all of the above answers are "NO", then may proceed with Cephalosporin use.     SOCIAL  HISTORY:  Social History  Substance Use Topics  . Smoking status: Current Every Day Smoker -- 0.50 packs/day for 35 years    Types: Cigarettes    Start date: 08/30/1986  . Smokeless tobacco: Never Used  . Alcohol Use: No     Comment: for 11 years states that she has quit     FAMILY HISTORY: Family History  Problem Relation Age of Onset  . COPD Father   . Hypertension Father   . Arthritis Brother     EXAM: BP 102/67 mmHg  Pulse 74  Temp(Src) 97.7 F (36.5 C) (Oral)  Resp 26  Ht 5\' 3"  (1.6 m)  Wt 168 lb (76.204 kg)  BMI 29.77 kg/m2  SpO2 100% CONSTITUTIONAL: Alert and oriented and responds appropriately to questions. Chronically ill; well-nourished; GCS 15 HEAD: Normocephalic; atraumatic EYES: Conjunctivae clear, PERRL, EOMI ENT: normal nose; no rhinorrhea; moist mucous membranes; pharynx without lesions noted; no dental injury; no septal hematoma NECK: Supple, no meningismus, no LAD; no midline spinal tenderness, step-off or deformity CARD: RRR; S1 and S2 appreciated; no murmurs, no clicks, no rubs, no gallops RESP: Normal chest excursion without splinting or tachypnea; breath sounds clear and equal bilaterally; no wheezes, no rhonchi, no rales; no hypoxia or respiratory distress CHEST:  chest wall stable, no crepitus or ecchymosis or deformity, nontender to palpation ABD/GI: Normal bowel sounds; non-distended; soft, non-tender, no  rebound, no guarding PELVIS:  stable, nontender to palpation BACK:  The back appears normal and is tender to palpation diffusely throughout her back without step-off or deformity, she reports that really new pain is over her right lumbar area, right SI, there is no CVA tenderness; no midline spinal tenderness, step-off or deformity EXT: Tender over the right posterior hip without deformity. No leg length discrepancy. 2+ DP pulses bilaterally. Normal ROM in all joints; otherwise extremities are non-tender to palpation; no edema; normal capillary refill; no cyanosis, no bony tenderness or bony deformity of patient's extremities, no joint effusion, no ecchymosis or lacerations    SKIN: Normal color for age and race; warm NEURO: Moves all extremities equally, reports decreased sensation throughout her left lower extremity which is chronic but otherwise sensation to light touch intact diffusely, cranial nerves II through XII intact PSYCH: The patient's mood and manner are appropriate. Grooming and personal hygiene are appropriate.  MEDICAL DECISION MAKING: Patient here with reports of the mechanical fall. Complaining of right hip pain and right lower back pain. States she is here because she wants to make sure that nothing is fractured. Will obtain x-rays of her lumbar spine and right hip. She is at her neurologic baseline. Does have a history of chronic pain but is not on pain medication at home. We'll give Vicodin in the emergency department. She has an allergy to acetaminophen listed but states that this is to Tylenol 3. No other sign of trauma on exam. Did not hit her head. No neck tenderness on exam.  ED PROGRESS: 4:00 AM  Pt's x-ray show no acute injury. I feel she is safe to be discharged home. We'll discharge the short prescription for Vicodin. Discussed return precautions. Patient verbalizes understanding and is comfortable with this plan.     [place], DO 07/02/15 [phone]

## 2015-07-11 ENCOUNTER — Telehealth: Payer: Self-pay | Admitting: Family Medicine

## 2015-07-11 NOTE — Telephone Encounter (Signed)
Patient is calling an stating that she needs advise on what she needs to do she fell in the bathtub and she is in bad due to pain in her back, please advise?

## 2015-07-11 NOTE — Telephone Encounter (Signed)
Patient had fall on 2/11 using walker in home.  Went to the ED.  She did have imaging done that night.  Is asking for an MRI.  States that Dr.  Merlene Laughter had previously discussed sending her to a new neurosurgeon but she will have to wait until May visit with him to discuss.  Please advise.

## 2015-07-12 NOTE — Telephone Encounter (Signed)
pls let her know that since Dr Merlene Laughter is taking care of her back and is also reciommending new neurosurgeon etc, she needs to contact him  For sooner appt due to neqw concerns.  pls enter referral for bback pain and recnet fall, asap appt, I will send Dr Merlene Laughter a flag

## 2015-07-19 ENCOUNTER — Encounter: Payer: Self-pay | Admitting: Family Medicine

## 2015-07-19 ENCOUNTER — Ambulatory Visit (INDEPENDENT_AMBULATORY_CARE_PROVIDER_SITE_OTHER): Payer: Medicare Other | Admitting: Family Medicine

## 2015-07-19 VITALS — BP 142/90 | HR 86 | Resp 18 | Ht 63.0 in | Wt 162.0 lb

## 2015-07-19 DIAGNOSIS — R7302 Impaired glucose tolerance (oral): Secondary | ICD-10-CM

## 2015-07-19 DIAGNOSIS — E785 Hyperlipidemia, unspecified: Secondary | ICD-10-CM | POA: Diagnosis not present

## 2015-07-19 DIAGNOSIS — R110 Nausea: Secondary | ICD-10-CM | POA: Diagnosis not present

## 2015-07-19 DIAGNOSIS — M549 Dorsalgia, unspecified: Secondary | ICD-10-CM

## 2015-07-19 DIAGNOSIS — I10 Essential (primary) hypertension: Secondary | ICD-10-CM

## 2015-07-19 DIAGNOSIS — Z1159 Encounter for screening for other viral diseases: Secondary | ICD-10-CM | POA: Diagnosis not present

## 2015-07-19 DIAGNOSIS — F17200 Nicotine dependence, unspecified, uncomplicated: Secondary | ICD-10-CM

## 2015-07-19 DIAGNOSIS — Z114 Encounter for screening for human immunodeficiency virus [HIV]: Secondary | ICD-10-CM | POA: Diagnosis not present

## 2015-07-19 MED ORDER — ONDANSETRON HCL 4 MG PO TABS: 4.0000 mg | ORAL_TABLET | Freq: Two times a day (BID) | ORAL | Status: DC | PRN

## 2015-07-19 MED ORDER — KETOROLAC TROMETHAMINE 60 MG/2ML IM SOLN
60.0000 mg | Freq: Once | INTRAMUSCULAR | Status: AC
  Administered 2015-07-19: 60 mg via INTRAMUSCULAR

## 2015-07-19 MED ORDER — PREDNISONE 5 MG (21) PO TBPK: ORAL_TABLET | ORAL | Status: DC

## 2015-07-19 MED ORDER — CLONIDINE HCL 0.1 MG PO TABS: 0.1000 mg | ORAL_TABLET | Freq: Every day | ORAL | Status: DC

## 2015-07-19 MED ORDER — METHYLPREDNISOLONE ACETATE 80 MG/ML IJ SUSP
80.0000 mg | Freq: Once | INTRAMUSCULAR | Status: AC
  Administered 2015-07-19: 80 mg via INTRAMUSCULAR

## 2015-07-19 MED ORDER — ONDANSETRON HCL 4 MG/2ML IJ SOLN
4.0000 mg | Freq: Once | INTRAMUSCULAR | Status: AC
  Administered 2015-07-19: 4 mg via INTRAMUSCULAR

## 2015-07-19 NOTE — Patient Instructions (Addendum)
Annual wellness in 6 weeks , call if you need me before  Labs today  You are referred for an MRI of lower back due to uncontrolled pain \ \Injections and medications sent , prednisone, and zofran for back pain and nausea  Work on quitting smoking please  Thanks for choosing [facility], we consider it a privelige to serve you.

## 2015-07-19 NOTE — Progress Notes (Signed)
Subjective:    Patient ID: Brooke Maddox, female    DOB: 1970-11-29, 45 y.o.   MRN: [id number]  HPI [place] 3/11 since then increased right lower ext pain to foot, unable to safely weight bear independently and has been relying on use of a walker Pain rated at a 10 plus. No position of comfort, even when lying down Denies new incontinence of stool or urine C/o reduced right lower extremity strength, pain is debilitating Has established arthritis and disc disease in lumbar spine, surgery originally planned has been deferred states because still smokng   Review of Systems See HPI Denies recent fever or chills. Denies sinus pressure, nasal congestion, ear pain or sore throat. Denies chest congestion, productive cough or wheezing. Denies chest pains, palpitations and leg swelling Denies abdominal pain, nausea, vomiting,diarrhea or constipation.   Denies dysuria, frequency, hesitancy or incontinence.  Denies skin break down or rash.        Objective:   Physical Exam BP 142/90 mmHg  Pulse 86  Resp 18  Ht 5\' 3"  (1.6 m)  Wt 162 lb (73.483 kg)  BMI 28.70 kg/m2  SpO2 96% Patient alert and oriented and in no cardiopulmonary distress.Tearful and in pain,crying at times  HEENT: No facial asymmetry, EOMI,   oropharynx pink and moist.  Neck supple no JVD, no mass.  Chest: Clear to auscultation bilaterally.  CVS: S1, S2 no murmurs, no S3.Regular rate.  ABD: Soft non tender.   Ext: No edema  [id number]  ROM lumbar spine,adequate in shoulders and knees  Skin: Intact, no ulcerations or rash noted.  Psych: Good eye contact, normal affect. Memory intact not anxious or depressed appearing.  CNS: CN 2-12 intact,  Grade 4 power,  In right lower extremity, grade 5 in other 3 extremities        Assessment & Plan:  Back pain with radiation Increased and uncontrolled low back pain radiiting to right lower extremity to food Decreased , grade 4 power in RLE, now ambulating with  walker MRI in 2015 positive for arhirtis and disc disease affecting left nerve root. New symptoms and signs with uncontrolled pain following fall, repeat imaging study needed Uncontrolled.Toradol and depo medrol administered IM in the office , to be followed by a short course of oral prednisone and NSAIDS.   Nausea without vomiting Uncontrolled and present at visit, zofran Im and also prescribed orally, uncontrolled pain is a trigger  NICOTINE ADDICTION Patient counseled for approximately 5 minutes regarding the health risks of ongoing nicotine use, specifically all types of cancer, heart disease, stroke and respiratory failure. The options available for help with cessation ,the behavioral changes to assist the process, and the option to either gradully reduce usage  Or abruptly stop.is also discussed. Pt is also encouraged to set specific goals in number of cigarettes used daily, as well as to set a quit date.     Essential hypertension Elevated and uncontrolled at visit, however , patient is currently experiencing uncontrolled pain. No med change DASH diet and commitment to daily physical activity for a minimum of 30 minutes discussed and encouraged, as a part of hypertension management. The importance of attaining a healthy weight is also discussed.  BP/Weight 07/19/2015 07/02/2015 04/11/2015 03/19/2015 02/21/2015 01/13/2015 [id number]  Systolic BP [id number] [id number]  Diastolic BP 90 57 84 74 72 79 80  Wt. (Lbs) 162 168 165 169 169 165 179  BMI 28.7 29.77 30.17 29.94 29.94 29.24 31.72  Review in 6 to 8 weeks

## 2015-07-19 NOTE — Telephone Encounter (Signed)
Patient in for office visit and notified

## 2015-07-20 LAB — LIPID PANEL
Cholesterol: 145 mg/dL (ref 125–200)
HDL: 50 mg/dL (ref >=46)
LDL Cholesterol: 77 mg/dL (ref <130)
Total CHOL/HDL Ratio: 2.9 Ratio (ref <=5.0)
Triglycerides: 92 mg/dL (ref <150)
VLDL: 18 mg/dL (ref <30)

## 2015-07-20 LAB — BASIC METABOLIC PANEL
BUN: 9 mg/dL (ref 7–25)
CO2: 24 mmol/L (ref 20–31)
Calcium: 10.4 mg/dL — ABNORMAL HIGH (ref 8.6–10.2)
Chloride: 102 mmol/L (ref 98–110)
Creat: 0.73 mg/dL (ref 0.50–1.10)
Glucose, Bld: 97 mg/dL (ref 65–99)
Potassium: 3.7 mmol/L (ref 3.5–5.3)
Sodium: 138 mmol/L (ref 135–146)

## 2015-07-20 LAB — CBC
HCT: 45.3 % (ref 36.0–46.0)
Hemoglobin: 15.4 g/dL — ABNORMAL HIGH (ref 12.0–15.0)
MCH: 31.7 pg (ref 26.0–34.0)
MCHC: 34 g/dL (ref 30.0–36.0)
MCV: 93.2 fL (ref 78.0–100.0)
MPV: 10.7 fL (ref 8.6–12.4)
Platelets: 408 10*3/uL — ABNORMAL HIGH (ref 150–400)
RBC: 4.86 MIL/uL (ref 3.87–5.11)
RDW: 13.6 % (ref 11.5–15.5)
WBC: 10.3 10*3/uL (ref 4.0–10.5)

## 2015-07-20 LAB — HEMOGLOBIN A1C
Hgb A1c MFr Bld: 5.5 % (ref <5.7)
Mean Plasma Glucose: 111 mg/dL

## 2015-07-20 LAB — TSH: TSH: 1.06 mIU/L

## 2015-07-20 LAB — HIV ANTIBODY (ROUTINE TESTING W REFLEX): HIV 1&2 Ab, 4th Generation: NONREACTIVE

## 2015-07-20 NOTE — Assessment & Plan Note (Signed)
Increased and uncontrolled low back pain radiiting to right lower extremity to food Decreased , grade 4 power in RLE, now ambulating with walker MRI in 2015 positive for arhirtis and disc disease affecting left nerve root. New symptoms and signs with uncontrolled pain following fall, repeat imaging study needed Uncontrolled.Toradol and depo medrol administered IM in the office , to be followed by a short course of oral prednisone and NSAIDS.

## 2015-07-20 NOTE — Assessment & Plan Note (Signed)
Uncontrolled and present at visit, zofran Im and also prescribed orally, uncontrolled pain is a trigger

## 2015-07-20 NOTE — Assessment & Plan Note (Signed)
Elevated and uncontrolled at visit, however , patient is currently experiencing uncontrolled pain. No med change DASH diet and commitment to daily physical activity for a minimum of 30 minutes discussed and encouraged, as a part of hypertension management. The importance of attaining a healthy weight is also discussed.  BP/Weight 07/19/2015 07/02/2015 04/11/2015 03/19/2015 02/21/2015 01/13/2015 [id number]  Systolic BP [id number]  Diastolic BP 90 57 84 74 72 79 80  Wt. (Lbs) 162 168 165 169 169 165 179  BMI 28.7 29.77 30.17 29.94 29.94 29.24 31.72      Review in 6 to 8 weeks

## 2015-07-20 NOTE — Assessment & Plan Note (Signed)

## 2015-07-21 ENCOUNTER — Encounter: Payer: Self-pay | Admitting: Family Medicine

## 2015-07-21 NOTE — Addendum Note (Signed)
Addended by: Eual Fines on: 07/21/2015 03:43 PM   Modules accepted: Orders

## 2015-07-25 ENCOUNTER — Telehealth: Payer: Self-pay | Admitting: Family Medicine

## 2015-07-25 NOTE — Telephone Encounter (Signed)
Patient is asking for a returned call from the nurse regarding blood pressure medication

## 2015-07-26 ENCOUNTER — Encounter (HOSPITAL_COMMUNITY): Payer: Self-pay | Admitting: Emergency Medicine

## 2015-07-26 ENCOUNTER — Emergency Department (HOSPITAL_COMMUNITY)
Admission: EM | Admit: 2015-07-26 | Discharge: 2015-07-26 | Disposition: A | Discharge status: Home / Self Care | Payer: Medicare Other | Attending: Emergency Medicine | Admitting: Emergency Medicine

## 2015-07-26 DIAGNOSIS — G40909 Epilepsy, unspecified, not intractable, without status epilepticus: Secondary | ICD-10-CM | POA: Diagnosis not present

## 2015-07-26 DIAGNOSIS — Z79899 Other long term (current) drug therapy: Secondary | ICD-10-CM | POA: Insufficient documentation

## 2015-07-26 DIAGNOSIS — M545 Low back pain: Secondary | ICD-10-CM | POA: Diagnosis not present

## 2015-07-26 DIAGNOSIS — J449 Chronic obstructive pulmonary disease, unspecified: Secondary | ICD-10-CM | POA: Diagnosis not present

## 2015-07-26 DIAGNOSIS — G8929 Other chronic pain: Secondary | ICD-10-CM | POA: Insufficient documentation

## 2015-07-26 DIAGNOSIS — F329 Major depressive disorder, single episode, unspecified: Secondary | ICD-10-CM | POA: Diagnosis not present

## 2015-07-26 DIAGNOSIS — E669 Obesity, unspecified: Secondary | ICD-10-CM | POA: Insufficient documentation

## 2015-07-26 DIAGNOSIS — M1990 Unspecified osteoarthritis, unspecified site: Secondary | ICD-10-CM | POA: Insufficient documentation

## 2015-07-26 DIAGNOSIS — E785 Hyperlipidemia, unspecified: Secondary | ICD-10-CM | POA: Diagnosis not present

## 2015-07-26 DIAGNOSIS — M5136 Other intervertebral disc degeneration, lumbar region: Secondary | ICD-10-CM | POA: Insufficient documentation

## 2015-07-26 DIAGNOSIS — F17210 Nicotine dependence, cigarettes, uncomplicated: Secondary | ICD-10-CM | POA: Insufficient documentation

## 2015-07-26 DIAGNOSIS — I10 Essential (primary) hypertension: Secondary | ICD-10-CM | POA: Diagnosis not present

## 2015-07-26 DIAGNOSIS — M5186 Other intervertebral disc disorders, lumbar region: Secondary | ICD-10-CM | POA: Diagnosis not present

## 2015-07-26 MED ORDER — KETOROLAC TROMETHAMINE 60 MG/2ML IM SOLN
60.0000 mg | Freq: Once | INTRAMUSCULAR | Status: AC
  Administered 2015-07-26: 60 mg via INTRAMUSCULAR
  Filled 2015-07-26: qty 2

## 2015-07-26 MED ORDER — DIAZEPAM 5 MG PO TABS
10.0000 mg | ORAL_TABLET | Freq: Once | ORAL | Status: AC
  Administered 2015-07-26: 10 mg via ORAL
  Filled 2015-07-26: qty 2

## 2015-07-26 MED ORDER — ONDANSETRON 4 MG PO TBDP
4.0000 mg | ORAL_TABLET | Freq: Once | ORAL | Status: AC
  Administered 2015-07-26: 4 mg via ORAL
  Filled 2015-07-26: qty 1

## 2015-07-26 MED ORDER — DEXAMETHASONE SODIUM PHOSPHATE 4 MG/ML IJ SOLN
8.0000 mg | Freq: Once | INTRAMUSCULAR | Status: AC
  Administered 2015-07-26: 8 mg via INTRAMUSCULAR
  Filled 2015-07-26: qty 2

## 2015-07-26 NOTE — Telephone Encounter (Signed)
Pharmacy had mixed up her med but its all fixed now

## 2015-07-26 NOTE — ED Notes (Signed)
Pt has been having chronic back pain, started hurting worse last night and getting worse

## 2015-07-26 NOTE — Discharge Instructions (Signed)
Please see [name], or your neurology specialist for assistance with your back pain management. Heating pad to the lower back area may be helpful. Use your  Degenerative Disk Disease Degenerative disk disease is a condition caused by the changes that occur in spinal disks as you grow older. Spinal disks are soft and compressible disks located between the bones of your spine (vertebrae). These disks act like shock absorbers. Degenerative disk disease can affect the whole spine. However, the neck and lower back are most commonly affected. Many changes can occur in the spinal disks with aging, such as:  The spinal disks may dry and shrink.  Small tears may occur in the tough, outer covering of the disk (annulus).  The disk space may become smaller due to loss of water.  Abnormal growths in the bone (spurs) may occur. This can put pressure on the nerve roots exiting the spinal canal, causing pain.  The spinal canal may become narrowed. RISK FACTORS   Being overweight.  Having a family history of degenerative disk disease.  Smoking.  There is increased risk if you are doing heavy lifting or have a sudden injury. SIGNS AND SYMPTOMS  Symptoms vary from person to person and may include:  Pain that varies in intensity. Some people have no pain, while others have severe pain. The location of the pain depends on the part of your backbone that is affected.  You will have neck or arm pain if a disk in the neck area is affected.  You will have pain in your back, buttocks, or legs if a disk in the lower back is affected.  Pain that becomes worse while bending, reaching up, or with twisting movements.  Pain that may start gradually and then get worse as time passes. It may also start after a major or minor injury.  Numbness or tingling in the arms or legs. DIAGNOSIS  Your health care provider will ask you about your symptoms and about activities or habits that may cause the pain. He or she  may also ask about any injuries, diseases, or treatments you have had. Your health care provider will examine you to check for the range of movement that is possible in the affected area, to check for strength in your extremities, and to check for sensation in the areas of the arms and legs supplied by different nerve roots. You may also have:   An X-ray of the spine.  Other imaging tests, such as MRI. TREATMENT  Your health care provider will advise you on the best plan for treatment. Treatment may include:  Medicines.  Rehabilitation exercises. HOME CARE INSTRUCTIONS   Follow proper lifting and walking techniques as advised by your health care provider.  Maintain good posture.  Exercise regularly as advised by your health care provider.  Perform relaxation exercises.  Change your sitting, standing, and sleeping habits as advised by your health care provider.  Change positions frequently.  Lose weight or maintain a healthy weight as advised by your health care provider.  Do not use any tobacco products, including cigarettes, chewing tobacco, or electronic cigarettes. If you need help quitting, ask your health care provider.  Wear supportive footwear.  Take medicines only as directed by your health care provider. SEEK MEDICAL CARE IF:   Your pain does not go away within 1-4 weeks.  You have significant appetite or weight loss. SEEK IMMEDIATE MEDICAL CARE IF:   Your pain is severe.  You notice weakness in your arms, hands,  or legs.  You begin to lose control of your bladder or bowel movements.  You have fevers or night sweats. MAKE SURE YOU:   Understand these instructions.  Will watch your condition.  Will get help right away if you are not doing well or get worse.   This information is not intended to replace advice given to you by your health care provider. Make sure you discuss any questions you have with your health care provider.   Document Released:  02/04/2007 Document Revised: 04/30/2014 Document Reviewed: 08/11/2013 Elsevier Interactive Patient Education [employer]. walker when up and about, please rest your back is much as possible.

## 2015-07-26 NOTE — ED Provider Notes (Signed)
CSN: [id number]     Arrival date & time 07/26/15  1847 History   First MD Initiated Contact with Patient 07/26/15 2017     Chief Complaint  Patient presents with  . Back Pain     (Consider location/radiation/quality/duration/timing/severity/associated sxs/prior Treatment) HPI Comments: Patient is a 45 year old female who presents to the emergency department with a complaint of lower back pain. The patient has a history of seizures, chronic back pain, arthritis, depression, anxiety, and nicotine addiction. The patient states that she has advanced disc problems in her lower back. She is followed by Dr. Idolina Primer quality, and states that he is going to refer her to a local surgeon for her back. She uses a walker most of the time, but states that her pain has progressed to where she has pain even when attempting to use her walker. It is of note that she was evaluated in the emergency department on March 11 because of a fall. X-rays of her right hip and right side of her back were negative for fracture or other acute problems. The patient presents tonight because she says the pain is getting progressively worse, and instead of involving primarily her right side, it is also now affecting her left buttocks cheek and part of her left leg. His been no loss of bowel or bladder control. No new falls reported.  Patient is a 45 y.o. female presenting with back pain. The history is provided by the patient.  Back Pain Location:  Lumbar spine Associated symptoms: no abdominal pain, no chest pain and no dysuria     Past Medical History  Diagnosis Date  . Hypertension   . Arthritis   . Depression   . Anxiety associated with depression 2007    hospitalised for mental health problems   . Seizures ([place])     Dr. Theda Sers   . Migraines   . Chronic back pain   . History of seizure disorder   . History of leukocytosis   . Bipolar affective disorder ([place])   . Osteoarthritis     mild   . Abdominal pain, acute, left  lower quadrant   . Chronic constipation   . Nausea   . Nicotine addiction   . Obesity   . Fatigue   . Acute knee pain     right   . Hyperlipidemia   . COPD (chronic obstructive pulmonary disease) ([place])   . Collagen vascular disease ([place])   . Renal insufficiency   . Lumbar radiculopathy     left  . Chronic leg pain     left  . Pain management   . Leukocytosis 10/22/2014   Past Surgical History  Procedure Laterality Date  . Vesicovaginal fistula closure w/ tah  2009  . Abdominal hysterectomy    . Left heart catheterization with coronary angiogram N/A 03/17/2013    Procedure: LEFT HEART CATHETERIZATION WITH CORONARY ANGIOGRAM;  Surgeon: Peter M Martinique, MD;  Location: [facility] CATH LAB;  Service: Cardiovascular;  Laterality: N/A;   Family History  Problem Relation Age of Onset  . COPD Father   . Hypertension Father   . Arthritis Brother    Social History  Substance Use Topics  . Smoking status: Current Every Day Smoker -- 0.50 packs/day for 35 years    Types: Cigarettes    Start date: 08/30/1986  . Smokeless tobacco: Never Used  . Alcohol Use: No     Comment: for 11 years states that she has quit    OB History  Gravida Para Term Preterm AB TAB SAB Ectopic Multiple Living   2 2 2             Review of Systems  Constitutional: Negative for activity change.       All ROS Neg except as noted in HPI  HENT: Negative for nosebleeds.   Eyes: Negative for photophobia and discharge.  Respiratory: Negative for cough, shortness of breath and wheezing.   Cardiovascular: Negative for chest pain and palpitations.  Gastrointestinal: Negative for abdominal pain and blood in stool.  Genitourinary: Negative for dysuria, frequency and hematuria.  Musculoskeletal: Positive for back pain and arthralgias. Negative for neck pain.  Skin: Negative.   Neurological: Positive for seizures. Negative for dizziness and speech difficulty.  Psychiatric/Behavioral: Negative for hallucinations and  confusion.      Allergies  Dilaudid; Lithium carbonate; Morphine and related; Sulfur; Tylenol; Wellbutrin; Codeine; Divalproex sodium; Fentanyl; Iodine; Iohexol; and Penicillins  Home Medications   Prior to Admission medications   Medication Sig Start Date End Date Taking? Authorizing Provider  ALPRAZolam Duanne Moron) 1 MG tablet Take 0.5 mg by mouth 5 (five) times daily. 07/21/15  Yes Historical Provider, MD  cloNIDine (CATAPRES) 0.1 MG tablet Take 1 tablet (0.1 mg total) by mouth at bedtime. 07/19/15  Yes Fayrene Helper, MD  FLUoxetine (PROZAC) 20 MG capsule TAKE 4 CAPSULES BY MOUTH EVERY MORNING 11/08/14  Yes Historical Provider, MD  fluticasone (FLONASE) 50 MCG/ACT nasal spray Place 2 sprays into both nostrils daily. 02/21/15  Yes Fayrene Helper, MD  lamoTRIgine (LAMICTAL) 200 MG tablet Take 400 mg by mouth every morning.   Yes Historical Provider, MD  levothyroxine (SYNTHROID, LEVOTHROID) 25 MCG tablet Take 1 tablet (25 mcg total) by mouth daily. 02/21/15  Yes Fayrene Helper, MD  nitroGLYCERIN (NITROSTAT) 0.4 MG SL tablet Place 1 tablet (0.4 mg total) under the tongue every 5 (five) minutes as needed for chest pain. 08/23/14  Yes Arnoldo Lenis, MD  ondansetron (ZOFRAN) 4 MG tablet Take 1 tablet (4 mg total) by mouth 2 (two) times daily as needed for nausea or vomiting. 07/19/15 07/18/16 Yes Fayrene Helper, MD  QVAR 40 MCG/ACT inhaler INHALE 1 PUFF BY MOUTH INTO THE LUNGS TWICE DAILY 05/30/15  Yes Fayrene Helper, MD  rizatriptan (MAXALT-MLT) 5 MG disintegrating tablet Take 5 mg by mouth as needed for migraine. May repeat in 2 hours if needed   Yes Historical Provider, MD  rosuvastatin (CRESTOR) 20 MG tablet TAKE 1 TABLET BY MOUTH EVERY NIGHT AT BEDTIME 01/10/15  Yes Fayrene Helper, MD  topiramate (TOPAMAX) 25 MG tablet Take 50 mg by mouth daily.   Yes Historical Provider, MD  montelukast (SINGULAIR) 10 MG tablet Take 1 tablet (10 mg total) by mouth at bedtime. Patient not  taking: Reported on 07/26/2015 02/21/15   Fayrene Helper, MD  ondansetron (ZOFRAN ODT) 4 MG disintegrating tablet Take 1 tablet (4 mg total) by mouth every 8 (eight) hours as needed for nausea or vomiting. Patient not taking: Reported on 07/26/2015 07/02/15   Kristen N Ward, DO  predniSONE (STERAPRED UNI-PAK 21 TAB) 5 MG (21) TBPK tablet As directed on package Patient not taking: Reported on 07/26/2015 07/19/15   Fayrene Helper, MD   BP 124/77 mmHg  Pulse 66  Temp(Src) 97.7 F (36.5 C) (Oral)  Resp 18  Ht 5\' 3"  (1.6 m)  Wt 76.204 kg  BMI 29.77 kg/m2  SpO2 97% Physical Exam  Constitutional: She is oriented to person, place,  and time. She appears well-developed and well-nourished.  Non-toxic appearance.  HENT:  Head: Normocephalic.  Right Ear: Tympanic membrane and external ear normal.  Left Ear: Tympanic membrane and external ear normal.  Eyes: EOM and lids are normal. Pupils are equal, round, and reactive to light.  Neck: Normal range of motion. Neck supple. Carotid bruit is not present.  Cardiovascular: Normal rate, regular rhythm, normal heart sounds, intact distal pulses and normal pulses.   Pulmonary/Chest: Breath sounds normal. No respiratory distress.  Abdominal: Soft. Bowel sounds are normal. There is no tenderness. There is no guarding.  Musculoskeletal: Normal range of motion.  There is pain with some movement, and with attempted palpation of the lower lumbar area. There is pain to palpation of the lower paraspinal areas of the lumbar region. There is no palpable step off appreciated. No noted bruising appreciated. No hot areas appreciated.  Lymphadenopathy:       Head (right side): No submandibular adenopathy present.       Head (left side): No submandibular adenopathy present.    She has no cervical adenopathy.  Neurological: She is alert and oriented to person, place, and time. She has normal strength. No cranial nerve deficit or sensory deficit.  Skin: Skin is warm and  dry.  Psychiatric: Her speech is normal. Her mood appears anxious.  Nursing note and vitals reviewed.   ED Course  Procedures (including critical care time) Labs Review Labs Reviewed - No data to display  Imaging Review No results found. I have personally reviewed and evaluated these images and lab results as part of my medical decision-making.   EKG Interpretation None      MDM  Patient presents to the emergency department with exacerbation of her chronic back problem. The patient is being evaluated by neurology. According to the patient arrangements are being made for surgical evaluation concerning the back. The patient was treated in the emergency department with intramuscular Toradol, Decadron, and oral Valium. The patient is encouraged to see Dr. Merlene Laughter, or [name] for assistance with pain management and pain control. She is encouraged to keep her appointment with the surgeon once that is set up.    Final diagnoses:  None    *I have reviewed nursing notes, vital signs, and all appropriate lab and imaging results for this patient.[address], PA-C 07/26/15 2102  Dorie Rank, MD 07/28/15 [phone]

## 2015-07-27 ENCOUNTER — Ambulatory Visit (HOSPITAL_COMMUNITY)
Admission: RE | Admit: 2015-07-27 | Discharge: 2015-07-27 | Disposition: A | Discharge status: Home / Self Care | Payer: Medicare Other | Source: Ambulatory Visit | Attending: Family Medicine | Admitting: Family Medicine

## 2015-07-27 DIAGNOSIS — M549 Dorsalgia, unspecified: Secondary | ICD-10-CM

## 2015-07-27 DIAGNOSIS — M47816 Spondylosis without myelopathy or radiculopathy, lumbar region: Secondary | ICD-10-CM | POA: Diagnosis not present

## 2015-07-27 DIAGNOSIS — R531 Weakness: Secondary | ICD-10-CM | POA: Insufficient documentation

## 2015-07-27 DIAGNOSIS — M5126 Other intervertebral disc displacement, lumbar region: Secondary | ICD-10-CM | POA: Insufficient documentation

## 2015-07-27 DIAGNOSIS — R200 Anesthesia of skin: Secondary | ICD-10-CM | POA: Diagnosis not present

## 2015-07-27 DIAGNOSIS — M545 Low back pain: Secondary | ICD-10-CM | POA: Diagnosis not present

## 2015-07-28 ENCOUNTER — Telehealth: Payer: Self-pay | Admitting: Family Medicine

## 2015-07-28 NOTE — Telephone Encounter (Signed)
Patient is asking for a returned call from [facility]

## 2015-07-29 NOTE — Telephone Encounter (Signed)
Patient concerns addressed 

## 2015-08-01 ENCOUNTER — Telehealth: Payer: Self-pay

## 2015-08-01 ENCOUNTER — Other Ambulatory Visit: Payer: Self-pay

## 2015-08-01 NOTE — Telephone Encounter (Signed)
Dr Merlene Laughter is treating her back pain she needs to continue with him, I am willing to prescribe gabapentin 300 mg one twice daily , anything else she will need to get from him I have entered, pls send if she agrees

## 2015-08-01 NOTE — Telephone Encounter (Signed)
Pt aware, declined gabapentin and faxed [facility] a message that pt had been trying to contact them

## 2015-08-04 ENCOUNTER — Other Ambulatory Visit: Payer: Self-pay | Admitting: Family Medicine

## 2015-08-23 DIAGNOSIS — F319 Bipolar disorder, unspecified: Secondary | ICD-10-CM | POA: Diagnosis not present

## 2015-08-30 DIAGNOSIS — G894 Chronic pain syndrome: Secondary | ICD-10-CM | POA: Diagnosis not present

## 2015-08-30 DIAGNOSIS — G43901 Migraine, unspecified, not intractable, with status migrainosus: Secondary | ICD-10-CM | POA: Diagnosis not present

## 2015-08-30 DIAGNOSIS — I10 Essential (primary) hypertension: Secondary | ICD-10-CM | POA: Diagnosis not present

## 2015-08-30 DIAGNOSIS — F319 Bipolar disorder, unspecified: Secondary | ICD-10-CM | POA: Diagnosis not present

## 2015-08-30 DIAGNOSIS — M533 Sacrococcygeal disorders, not elsewhere classified: Secondary | ICD-10-CM | POA: Diagnosis not present

## 2015-08-30 DIAGNOSIS — F603 Borderline personality disorder: Secondary | ICD-10-CM | POA: Diagnosis not present

## 2015-08-30 DIAGNOSIS — M545 Low back pain: Secondary | ICD-10-CM | POA: Diagnosis not present

## 2015-08-30 DIAGNOSIS — E785 Hyperlipidemia, unspecified: Secondary | ICD-10-CM | POA: Diagnosis not present

## 2015-08-30 DIAGNOSIS — Z79899 Other long term (current) drug therapy: Secondary | ICD-10-CM | POA: Diagnosis not present

## 2015-08-30 DIAGNOSIS — M4726 Other spondylosis with radiculopathy, lumbar region: Secondary | ICD-10-CM | POA: Diagnosis not present

## 2015-08-30 DIAGNOSIS — M1990 Unspecified osteoarthritis, unspecified site: Secondary | ICD-10-CM | POA: Diagnosis not present

## 2015-09-01 ENCOUNTER — Telehealth: Payer: Self-pay

## 2015-09-01 NOTE — Telephone Encounter (Signed)
pls explain tha t Dr Merlene Laughter once he has a ,list of her current meds will have the same access to determining med safety, and that he will take responsibility for his prescription I see no pain  Patch on her current list so I cannot comment

## 2015-09-02 ENCOUNTER — Other Ambulatory Visit: Payer: Self-pay | Admitting: Family Medicine

## 2015-09-02 NOTE — Telephone Encounter (Signed)
Attempted to reach patient.  # does not have a voicemail set up.   Will try again.

## 2015-09-06 ENCOUNTER — Encounter: Payer: Self-pay | Admitting: Cardiology

## 2015-09-06 ENCOUNTER — Ambulatory Visit (INDEPENDENT_AMBULATORY_CARE_PROVIDER_SITE_OTHER): Payer: Medicare Other | Admitting: Cardiology

## 2015-09-06 VITALS — BP 112/68 | HR 97 | Ht 63.0 in | Wt 159.0 lb

## 2015-09-06 DIAGNOSIS — R0789 Other chest pain: Secondary | ICD-10-CM | POA: Diagnosis not present

## 2015-09-06 DIAGNOSIS — E785 Hyperlipidemia, unspecified: Secondary | ICD-10-CM

## 2015-09-06 DIAGNOSIS — Z01810 Encounter for preprocedural cardiovascular examination: Secondary | ICD-10-CM | POA: Diagnosis not present

## 2015-09-06 DIAGNOSIS — I10 Essential (primary) hypertension: Secondary | ICD-10-CM | POA: Diagnosis not present

## 2015-09-06 NOTE — Progress Notes (Signed)
Patient ID: Brooke Maddox, female   DOB: [birth year], 45 y.o.   MRN: [id number]     Clinical Summary [name] is a 45 y.o.female seen today for follow up of the following medical problems.   1. Chest pain  - patient seen previously for chest pain - Multiple CAD risk factors: HTN, HL, tobacco, father MI age 38 - Stress MPI technically difficult, suggestion of possible defects. Referred for cath 03/17/13 with patent coronaries.  - still with occasional atypical chest pain symptoms, typically comes on with emotional stress. Can be better with prn NG.  - occasional SOB. Denies any LE edema  2. HTN - compliant with meds  3. Hyperlipidemia - 06/2015 TC 145 TG 92 HDL 50 LDL 77 - compliant with statin  4. Preoperative evaluation - being considered for lower back surgery.  - exertion limited by chronic back pain, highest level of exertion is walking to mailbox and back.     Past Medical History  Diagnosis Date  . Hypertension   . Arthritis   . Depression   . Anxiety associated with depression 2007    hospitalised for mental health problems   . Seizures ([place])     Dr. Theda Sers   . Migraines   . Chronic back pain   . History of seizure disorder   . History of leukocytosis   . Bipolar affective disorder ([place])   . Osteoarthritis     mild   . Abdominal pain, acute, left lower quadrant   . Chronic constipation   . Nausea   . Nicotine addiction   . Obesity   . Fatigue   . Acute knee pain     right   . Hyperlipidemia   . COPD (chronic obstructive pulmonary disease) ([place])   . Collagen vascular disease ([place])   . Renal insufficiency   . Lumbar radiculopathy     left  . Chronic leg pain     left  . Pain management   . Leukocytosis 10/22/2014     Allergies  Allergen Reactions  . Dilaudid [Hydromorphone]     Gives patient a huge headache  . Lithium Carbonate Other (See Comments)    Shakes   . Morphine And Related Other (See Comments)    Extended Drowsiness-headache  . Sulfur     Rash   . Tylenol [Acetaminophen]     Cannot take Tylenol #3  . Wellbutrin [Bupropion] Other (See Comments)    H/o seizure d/o in medical record   . Codeine Nausea And Vomiting  . Divalproex Sodium Rash  . Fentanyl Rash  . Iodine Rash  . Iohexol Rash  . Penicillins Nausea And Vomiting and Rash    Has patient had a PCN reaction causing immediate rash, facial/tongue/throat swelling, SOB or lightheadedness with hypotension: No Has patient had a PCN reaction causing severe rash involving mucus membranes or skin necrosis: No Has patient had a PCN reaction that required hospitalization: no Has patient had a PCN reaction occurring within the last 10 years: yes If all of the above answers are "NO", then may proceed with Cephalosporin use.      Current Outpatient Prescriptions  Medication Sig Dispense Refill  . ALPRAZolam (XANAX) 1 MG tablet Take 0.5 mg by mouth 5 (five) times daily.  2  . cloNIDine (CATAPRES) 0.1 MG tablet Take 1 tablet (0.1 mg total) by mouth at bedtime. 30 tablet 3  . FLUoxetine (PROZAC) 20 MG capsule TAKE 4 CAPSULES BY MOUTH EVERY MORNING  2  .  fluticasone (FLONASE) 50 MCG/ACT nasal spray Place 2 sprays into both nostrils daily. 16 g 6  . lamoTRIgine (LAMICTAL) 200 MG tablet Take 400 mg by mouth every morning.    Marland Kitchen levothyroxine (SYNTHROID, LEVOTHROID) 25 MCG tablet Take 1 tablet (25 mcg total) by mouth daily. 30 tablet 4  . montelukast (SINGULAIR) 10 MG tablet Take 1 tablet (10 mg total) by mouth at bedtime. (Patient not taking: Reported on 07/26/2015) 30 tablet 3  . nitroGLYCERIN (NITROSTAT) 0.4 MG SL tablet Place 1 tablet (0.4 mg total) under the tongue every 5 (five) minutes as needed for chest pain. 25 tablet 3  . ondansetron (ZOFRAN ODT) 4 MG disintegrating tablet Take 1 tablet (4 mg total) by mouth every 8 (eight) hours as needed for nausea or vomiting. (Patient not taking: Reported on 07/26/2015) 20 tablet 0  . ondansetron (ZOFRAN) 4 MG tablet Take 1 tablet (4 mg total)  by mouth 2 (two) times daily as needed for nausea or vomiting. 20 tablet 0  . predniSONE (STERAPRED UNI-PAK 21 TAB) 5 MG (21) TBPK tablet As directed on package (Patient not taking: Reported on 07/26/2015) 21 tablet 0  . QVAR 40 MCG/ACT inhaler INHALE 1 PUFF BY MOUTH INTO THE LUNGS TWICE DAILY 8.7 g 0  . rizatriptan (MAXALT-MLT) 5 MG disintegrating tablet Take 5 mg by mouth as needed for migraine. May repeat in 2 hours if needed    . rosuvastatin (CRESTOR) 20 MG tablet TAKE 1 TABLET BY MOUTH EVERY NIGHT AT BEDTIME 30 tablet 3  . topiramate (TOPAMAX) 25 MG tablet Take 50 mg by mouth daily.     No current facility-administered medications for this visit.     Past Surgical History  Procedure Laterality Date  . Vesicovaginal fistula closure w/ tah  2009  . Abdominal hysterectomy    . Left heart catheterization with coronary angiogram N/A 03/17/2013    Procedure: LEFT HEART CATHETERIZATION WITH CORONARY ANGIOGRAM;  Surgeon: Peter M Martinique, MD;  Location: [facility] CATH LAB;  Service: Cardiovascular;  Laterality: N/A;     Allergies  Allergen Reactions  . Dilaudid [Hydromorphone]     Gives patient a huge headache  . Lithium Carbonate Other (See Comments)    Shakes   . Morphine And Related Other (See Comments)    Extended Drowsiness-headache  . Sulfur     Rash   . Tylenol [Acetaminophen]     Cannot take Tylenol #3  . Wellbutrin [Bupropion] Other (See Comments)    H/o seizure d/o in medical record   . Codeine Nausea And Vomiting  . Divalproex Sodium Rash  . Fentanyl Rash  . Iodine Rash  . Iohexol Rash  . Penicillins Nausea And Vomiting and Rash    Has patient had a PCN reaction causing immediate rash, facial/tongue/throat swelling, SOB or lightheadedness with hypotension: No Has patient had a PCN reaction causing severe rash involving mucus membranes or skin necrosis: No Has patient had a PCN reaction that required hospitalization: no Has patient had a PCN reaction occurring within the last  10 years: yes If all of the above answers are "NO", then may proceed with Cephalosporin use.       Family History  Problem Relation Age of Onset  . COPD Father   . Hypertension Father   . Arthritis Brother      Social History Ms. Hearty reports that she has been smoking Cigarettes.  She started smoking about 29 years ago. She has a 17.5 pack-year smoking history. She has never used  smokeless tobacco. Ms. Gheen reports that she does not drink alcohol.   Review of Systems CONSTITUTIONAL: No weight loss, fever, chills, weakness or fatigue.  HEENT: Eyes: No visual loss, blurred vision, double vision or yellow sclerae.No hearing loss, sneezing, congestion, runny nose or sore throat.  SKIN: No rash or itching.  CARDIOVASCULAR: per HPI RESPIRATORY: No shortness of breath, cough or sputum.  GASTROINTESTINAL: No anorexia, nausea, vomiting or diarrhea. No abdominal pain or blood.  GENITOURINARY: No burning on urination, no polyuria NEUROLOGICAL: +leg weakness  MUSCULOSKELETAL: + back pain LYMPHATICS: No enlarged nodes. No history of splenectomy.  PSYCHIATRIC: No history of depression or anxiety.  ENDOCRINOLOGIC: No reports of sweating, cold or heat intolerance. No polyuria or polydipsia.  Marland Kitchen   Physical Examination Filed Vitals:   09/06/15 1021  BP: 112/68  Pulse: 97   Filed Vitals:   09/06/15 1021  Height: 5\' 3"  (1.6 m)  Weight: 159 lb (72.122 kg)    Gen: resting comfortably, no acute distress HEENT: no scleral icterus, pupils equal round and reactive, no palptable cervical adenopathy,  CV: RRR, no m/r/g, no jvd Resp: Clear to auscultation bilaterally GI: abdomen is soft, non-tender, non-distended, normal bowel sounds, no hepatosplenomegaly MSK: extremities are warm, no edema.  Skin: warm, no rash Neuro:  no focal deficits Psych: appropriate affect   Diagnostic Studies 02/27/13 Stress MPI IMPRESSION: 1. Abnormal Lexiscan Cardiolite study. 2. Marked GI uptake obscuring  the inferior wall. Significant reverse redistribution. 3. Moderate fixed apical/anteroapical defect with mild hypokinesis, suggestive of myocardial scar. 4. Low normal LV systolic function, calculated EF 51%. The aforementioned areas were mildly hypokinetic. Remaining wall motion was otherwise normal. 5. Clinical correlation is highly recommended.    02/2013 Cath Procedural Findings:  Hemodynamics:  AO 104/56 mean 79 mm Hg  LV 103/12 mm Hg  Coronary angiography:  Coronary dominance: left  Left mainstem: Normal  Left anterior descending (LAD): Normal  Left circumflex (LCx): large dominant vessel gives rise to 2 OMs, 2 PLOMs, and PDA. It is normal throughout.  Right coronary artery (RCA): Normal.  Left ventriculography: Left ventricular systolic function is normal, LVEF is estimated at 55-65%, there is no significant mitral regurgitation  Final Conclusions:  1. Normal coronary anatomy.  2. Normal LV function.  02/2013 Echo Study Conclusions  - Left ventricle: The cavity size was normal. Wall thickness was normal. Systolic function was normal. The estimated ejection fraction was in the range of 55% to 60%. Wall motion was normal; there were no regional wall motion abnormalities. Features are consistent with a pseudonormal left ventricular filling pattern, with concomitant abnormal relaxation and increased filling pressure (grade 2 diastolic dysfunction). - Left atrium: The atrium was at the upper limits of normal in size. - Right atrium: Central venous pressure: 37mm Hg (est). - Tricuspid valve: Physiologic regurgitation. - Pulmonary arteries: PA peak pressure: 60mm Hg (S). - Pericardium, extracardiac: There was no pericardial effusion. Impressions:  - No prior study for comparison. Normal LV wall thickness with LVEF 55-60%, probable grade 2 diastolic dysfunction. Upper normal left atrial size. No major valvular abnormalities. PASP 23  mmHg. No pericardial effusion.     Assessment and Plan  1. Chest pain  - noncardiac chest pain, recent cath 02/2013 with patent coronaries - continue to monitor  2. HTN - at goal, continue current meds  3. Hyperlipidemia - at goal, continue current meds  4. Preoperative evaluation - being considered for lower back surgery. Recent extensive cardiac testing including echo and heart cath that  are overall benign, recommend proceeding with surgery as planned.    F/u 1 year    Arnoldo Lenis, M.D.

## 2015-09-06 NOTE — Patient Instructions (Signed)
Medication Instructions:  Your physician recommends that you continue on your current medications as directed. Please refer to the Current Medication list given to you today.  Labwork: NONE  Testing/Procedures: NONE  Follow-Up: Your physician wants you to follow-up in: 1 YEAR WITH DR. BRANCH. You will receive a reminder letter in the mail two months in advance. If you don't receive a letter, please call our office to schedule the follow-up appointment.  Any Other Special Instructions Will Be Listed Below (If Applicable).  If you need a refill on your cardiac medications before your next appointment, please call your pharmacy. 

## 2015-09-20 ENCOUNTER — Telehealth: Payer: Self-pay | Admitting: Family Medicine

## 2015-09-20 NOTE — Telephone Encounter (Signed)
Brooke Maddox is asking for a returned call from the nurse for medication question, please advise?

## 2015-09-20 NOTE — Telephone Encounter (Signed)
Called patient.  Unable to leave message. Will await patient to call back.

## 2015-09-21 NOTE — Telephone Encounter (Signed)
Patient is asking for MRI of back results to be faxed back to Dr. Merlene Laughter.  Results faxed.

## 2015-09-27 ENCOUNTER — Other Ambulatory Visit: Payer: Self-pay | Admitting: Family Medicine

## 2015-09-29 ENCOUNTER — Telehealth: Payer: Self-pay | Admitting: Family Medicine

## 2015-09-29 NOTE — Telephone Encounter (Signed)
Brooke Maddox is calling and stating that she is needing the dose changed on her inhaler QVAR 40 MCG/ACT inhaler  That its not working like it should, please advise?

## 2015-09-30 NOTE — Telephone Encounter (Signed)
Called patient, no answer and no option to leave a message

## 2015-10-05 ENCOUNTER — Telehealth: Payer: Self-pay

## 2015-10-05 NOTE — Telephone Encounter (Signed)
Wellness appt past due , no appt in system, pls sched wellness visit asap, will address at that time

## 2015-10-05 NOTE — Telephone Encounter (Signed)
Pt can't come in today so was scheduled for the end of the month

## 2015-10-05 NOTE — Telephone Encounter (Signed)
States she is wanting an increase on her QVAR because its not working as good since the weather has gotten so hot and she's having to use it more than directed. Wants it sent in to her pharmacy

## 2015-10-19 ENCOUNTER — Encounter: Payer: Self-pay | Admitting: Family Medicine

## 2015-10-19 ENCOUNTER — Encounter: Payer: Medicare Other | Admitting: Family Medicine

## 2015-10-24 ENCOUNTER — Telehealth: Payer: Self-pay | Admitting: Family Medicine

## 2015-10-24 NOTE — Telephone Encounter (Signed)
awv scheduled.

## 2015-10-24 NOTE — Telephone Encounter (Signed)
Ritu is calling asking for the nurse she has questions, please advise?

## 2015-11-01 ENCOUNTER — Other Ambulatory Visit: Payer: Self-pay | Admitting: Neurosurgery

## 2015-11-01 DIAGNOSIS — M5417 Radiculopathy, lumbosacral region: Secondary | ICD-10-CM | POA: Diagnosis not present

## 2015-11-01 DIAGNOSIS — Z6827 Body mass index (BMI) 27.0-27.9, adult: Secondary | ICD-10-CM | POA: Diagnosis not present

## 2015-11-08 ENCOUNTER — Encounter (HOSPITAL_COMMUNITY)
Admission: RE | Admit: 2015-11-08 | Discharge: 2015-11-08 | Disposition: A | Discharge status: Home / Self Care | Payer: Medicare Other | Source: Ambulatory Visit | Attending: Neurosurgery | Admitting: Neurosurgery

## 2015-11-08 ENCOUNTER — Encounter (HOSPITAL_COMMUNITY): Payer: Self-pay

## 2015-11-08 HISTORY — DX: Other specified postprocedural states: Z98.890

## 2015-11-08 HISTORY — DX: Cardiac murmur, unspecified: R01.1

## 2015-11-08 HISTORY — DX: Hypothyroidism, unspecified: E03.9

## 2015-11-08 HISTORY — DX: Nausea with vomiting, unspecified: R11.2

## 2015-11-08 LAB — BASIC METABOLIC PANEL
Anion gap: 9 (ref 5–15)
BUN: 7 mg/dL (ref 6–20)
CO2: 22 mmol/L (ref 22–32)
Calcium: 10 mg/dL (ref 8.9–10.3)
Chloride: 106 mmol/L (ref 101–111)
Creatinine, Ser: 0.76 mg/dL (ref 0.44–1.00)
GFR calc Af Amer: >60 mL/min (ref >60)
GFR calc non Af Amer: >60 mL/min (ref >60)
Glucose, Bld: 103 mg/dL — ABNORMAL HIGH (ref 65–99)
Potassium: 3.4 mmol/L — ABNORMAL LOW (ref 3.5–5.1)
Sodium: 137 mmol/L (ref 135–145)

## 2015-11-08 LAB — CBC
HCT: 43.5 % (ref 36.0–46.0)
Hemoglobin: 14.7 g/dL (ref 12.0–15.0)
MCH: 31.7 pg (ref 26.0–34.0)
MCHC: 33.8 g/dL (ref 30.0–36.0)
MCV: 94 fL (ref 78.0–100.0)
Platelets: 304 10*3/uL (ref 150–400)
RBC: 4.63 MIL/uL (ref 3.87–5.11)
RDW: 13.1 % (ref 11.5–15.5)
WBC: 8.5 10*3/uL (ref 4.0–10.5)

## 2015-11-08 LAB — TYPE AND SCREEN
ABO/RH(D): O POS
Antibody Screen: NEGATIVE

## 2015-11-08 LAB — SURGICAL PCR SCREEN
MRSA, PCR: NEGATIVE
Staphylococcus aureus: NEGATIVE

## 2015-11-08 LAB — ABO/RH: ABO/RH(D): O POS

## 2015-11-08 NOTE — Progress Notes (Signed)
PCP - Dr. Tula Nakayama Cardiologist - Dr. Harl Bowie  EKG - 12/07/14 CXR - denies  Echo - 2014 Stress test- 2014 Cardiac Cath - 2014  Patient denies chest pain and shortness of breath at PAT appointment.

## 2015-11-08 NOTE — Pre-Procedure Instructions (Signed)
Brooke Maddox  11/08/2015      [facility] - [place], [place] - [address] [place] [address] Phone: [phone] Fax: [phone]    Your procedure is scheduled on Wednesday, July 19th, 2017.  Report to [facility] Admitting at 1:00 P.M.   Call this number if you have problems the morning of surgery:  [phone]   Remember:  Do not eat food or drink liquids after midnight.   Take these medicines the morning of surgery with A SIP OF WATER: Alprazolam (Xanax), Nasal spray if needed, Clonidine (Catapres), Fluoxetine (Prozac), Lamotrigine (Lamictal), Levothyroxine (Synthroid), Ondansetron (Zofran) if needed, Topiramate (Topamax), Rizatriptan (Maxalt-MLT) if needed.  Stop taking: Aspirin, NSAIDS, Aleve, Naproxen, Ibuprofen, Advil, Motrin, BC's, Goody's, Fish oil, all herbal medications, and all vitamins.     Do not wear jewelry, make-up or nail polish.  Do not wear lotions, powders, or perfumes.  You may NOT wear deoderant.  Do not shave 48 hours prior to surgery.    Do not bring valuables to the hospital.   [facility] is not responsible for any belongings or valuables.  Contacts, dentures or bridgework may not be worn into surgery.  Leave your suitcase in the car.  After surgery it may be brought to your room.  For patients admitted to the hospital, discharge time will be determined by your treatment team.  Patients discharged the day of surgery will not be allowed to drive home.   Special instructions:  Preparing for Surgery.   Please read over the following fact sheets that you were given. MRSA Information    [place]- Preparing For Surgery  Before surgery, you can play an important role. Because skin is not sterile, your skin needs to be as free of germs as possible. You can reduce the number of germs on your skin by washing with CHG (chlorahexidine gluconate) Soap before  surgery.  CHG is an antiseptic cleaner which kills germs and bonds with the skin to continue killing germs even after washing.  Please do not use if you have an allergy to CHG or antibacterial soaps. If your skin becomes reddened/irritated stop using the CHG.  Do not shave (including legs and underarms) for at least 48 hours prior to first CHG shower. It is OK to shave your face.  Please follow these instructions carefully.   1. Shower the NIGHT BEFORE SURGERY and the MORNING OF SURGERY with CHG.   2. If you chose to wash your hair, wash your hair first as usual with your normal shampoo.  3. After you shampoo, rinse your hair and body thoroughly to remove the shampoo.  4. Use CHG as you would any other liquid soap. You can apply CHG directly to the skin and wash gently with a scrungie or a clean washcloth.   5. Apply the CHG Soap to your body ONLY FROM THE NECK DOWN.  Do not use on open wounds or open sores. Avoid contact with your eyes, ears, mouth and genitals (private parts). Wash genitals (private parts) with your normal soap.  6. Wash thoroughly, paying special attention to the area where your surgery will be performed.  7. Thoroughly rinse your body with warm water from the neck down.  8. DO NOT shower/wash with your normal soap after using and rinsing off the CHG Soap.  9. Pat yourself dry with a CLEAN TOWEL.  10. Wear CLEAN PAJAMAS   11. Place CLEAN SHEETS on your bed the night of your first shower and DO NOT SLEEP WITH PETS.  Day of Surgery: Do not apply any deodorants/lotions. Please wear clean clothes to the hospital/surgery center.

## 2015-11-08 NOTE — Progress Notes (Signed)
Patient notified of time change and states that she will arrive to [facility] at 11:00 on 11/09/15

## 2015-11-09 ENCOUNTER — Encounter (HOSPITAL_COMMUNITY)
Admission: RE | Disposition: A | Discharge status: Home / Self Care | Payer: Self-pay | Source: Ambulatory Visit | Attending: Neurosurgery

## 2015-11-09 ENCOUNTER — Inpatient Hospital Stay (HOSPITAL_COMMUNITY)
Admission: RE | Admit: 2015-11-09 | Discharge: 2015-11-13 | DRG: 460 | Disposition: A | Discharge status: Home / Self Care | Payer: Medicare Other | Source: Ambulatory Visit | Attending: Neurosurgery | Admitting: Neurosurgery

## 2015-11-09 ENCOUNTER — Encounter (HOSPITAL_COMMUNITY): Payer: Self-pay | Admitting: *Deleted

## 2015-11-09 DIAGNOSIS — Z79899 Other long term (current) drug therapy: Secondary | ICD-10-CM | POA: Diagnosis not present

## 2015-11-09 DIAGNOSIS — F329 Major depressive disorder, single episode, unspecified: Secondary | ICD-10-CM | POA: Diagnosis present

## 2015-11-09 DIAGNOSIS — F419 Anxiety disorder, unspecified: Secondary | ICD-10-CM | POA: Diagnosis present

## 2015-11-09 DIAGNOSIS — M5417 Radiculopathy, lumbosacral region: Secondary | ICD-10-CM | POA: Diagnosis not present

## 2015-11-09 DIAGNOSIS — M4317 Spondylolisthesis, lumbosacral region: Principal | ICD-10-CM | POA: Diagnosis present

## 2015-11-09 DIAGNOSIS — M1990 Unspecified osteoarthritis, unspecified site: Secondary | ICD-10-CM | POA: Diagnosis not present

## 2015-11-09 DIAGNOSIS — M79606 Pain in leg, unspecified: Secondary | ICD-10-CM | POA: Diagnosis not present

## 2015-11-09 DIAGNOSIS — M4727 Other spondylosis with radiculopathy, lumbosacral region: Secondary | ICD-10-CM | POA: Diagnosis present

## 2015-11-09 DIAGNOSIS — M4316 Spondylolisthesis, lumbar region: Secondary | ICD-10-CM | POA: Diagnosis present

## 2015-11-09 DIAGNOSIS — Z419 Encounter for procedure for purposes other than remedying health state, unspecified: Secondary | ICD-10-CM

## 2015-11-09 DIAGNOSIS — J449 Chronic obstructive pulmonary disease, unspecified: Secondary | ICD-10-CM | POA: Diagnosis not present

## 2015-11-09 DIAGNOSIS — E7800 Pure hypercholesterolemia, unspecified: Secondary | ICD-10-CM | POA: Diagnosis present

## 2015-11-09 DIAGNOSIS — Z981 Arthrodesis status: Secondary | ICD-10-CM | POA: Diagnosis not present

## 2015-11-09 DIAGNOSIS — H9190 Unspecified hearing loss, unspecified ear: Secondary | ICD-10-CM | POA: Diagnosis present

## 2015-11-09 DIAGNOSIS — I10 Essential (primary) hypertension: Secondary | ICD-10-CM | POA: Diagnosis present

## 2015-11-09 LAB — GLUCOSE, CAPILLARY: Glucose-Capillary: 125 mg/dL — ABNORMAL HIGH (ref 65–99)

## 2015-11-09 LAB — CBC
HCT: 38.3 % (ref 36.0–46.0)
Hemoglobin: 12.5 g/dL (ref 12.0–15.0)
MCH: 30.8 pg (ref 26.0–34.0)
MCHC: 32.6 g/dL (ref 30.0–36.0)
MCV: 94.3 fL (ref 78.0–100.0)
Platelets: 276 10*3/uL (ref 150–400)
RBC: 4.06 MIL/uL (ref 3.87–5.11)
RDW: 13 % (ref 11.5–15.5)
WBC: 8.8 10*3/uL (ref 4.0–10.5)

## 2015-11-09 LAB — CREATININE, SERUM
Creatinine, Ser: 0.78 mg/dL (ref 0.44–1.00)
GFR calc Af Amer: >60 mL/min (ref >60)
GFR calc non Af Amer: >60 mL/min (ref >60)

## 2015-11-09 SURGERY — CANCELLED PROCEDURE

## 2015-11-09 MED ORDER — ONDANSETRON HCL 4 MG PO TABS
4.0000 mg | ORAL_TABLET | Freq: Four times a day (QID) | ORAL | Status: DC | PRN
  Administered 2015-11-11 (×2): 4 mg via ORAL
  Filled 2015-11-09 (×2): qty 1

## 2015-11-09 MED ORDER — SODIUM CHLORIDE 0.9% FLUSH
3.0000 mL | Freq: Two times a day (BID) | INTRAVENOUS | Status: DC
  Administered 2015-11-09: 3 mL via INTRAVENOUS

## 2015-11-09 MED ORDER — ONDANSETRON HCL 4 MG/2ML IJ SOLN
4.0000 mg | Freq: Four times a day (QID) | INTRAMUSCULAR | Status: DC | PRN
  Administered 2015-11-10: 4 mg via INTRAVENOUS

## 2015-11-09 MED ORDER — LEVOTHYROXINE SODIUM 25 MCG PO TABS
25.0000 ug | ORAL_TABLET | Freq: Every day | ORAL | Status: DC
  Administered 2015-11-10 – 2015-11-13 (×4): 25 ug via ORAL
  Filled 2015-11-09 (×4): qty 1

## 2015-11-09 MED ORDER — ALPRAZOLAM 0.25 MG PO TABS
ORAL_TABLET | ORAL | Status: AC
Start: 2015-11-09 — End: 2015-11-09
  Administered 2015-11-09: 0.5 mg via ORAL
  Filled 2015-11-09: qty 2

## 2015-11-09 MED ORDER — SODIUM CHLORIDE 0.9 % IV SOLN: 250.0000 mL | INTRAVENOUS | Status: DC | PRN

## 2015-11-09 MED ORDER — ONDANSETRON HCL 4 MG PO TABS: 4.0000 mg | ORAL_TABLET | Freq: Two times a day (BID) | ORAL | Status: DC | PRN

## 2015-11-09 MED ORDER — AZELASTINE HCL 0.1 % NA SOLN
2.0000 | Freq: Two times a day (BID) | NASAL | Status: DC
  Administered 2015-11-10 – 2015-11-13 (×7): 2 via NASAL
  Filled 2015-11-09: qty 30

## 2015-11-09 MED ORDER — ROSUVASTATIN CALCIUM 20 MG PO TABS
20.0000 mg | ORAL_TABLET | Freq: Every day | ORAL | Status: DC
  Administered 2015-11-11 – 2015-11-12 (×2): 20 mg via ORAL
  Filled 2015-11-09 (×4): qty 1

## 2015-11-09 MED ORDER — TOPIRAMATE 25 MG PO TABS
50.0000 mg | ORAL_TABLET | Freq: Every day | ORAL | Status: DC
  Administered 2015-11-11 – 2015-11-13 (×3): 50 mg via ORAL
  Filled 2015-11-09 (×3): qty 2

## 2015-11-09 MED ORDER — SUMATRIPTAN SUCCINATE 50 MG PO TABS
50.0000 mg | ORAL_TABLET | ORAL | Status: DC | PRN
  Filled 2015-11-09: qty 1

## 2015-11-09 MED ORDER — LEVOTHYROXINE SODIUM 25 MCG PO TABS: 25.0000 ug | ORAL_TABLET | Freq: Every day | ORAL | Status: DC

## 2015-11-09 MED ORDER — CHLORHEXIDINE GLUCONATE CLOTH 2 % EX PADS: 6.0000 | MEDICATED_PAD | Freq: Once | CUTANEOUS | Status: DC

## 2015-11-09 MED ORDER — FLUOXETINE HCL 20 MG PO CAPS
80.0000 mg | ORAL_CAPSULE | Freq: Every day | ORAL | Status: DC
  Administered 2015-11-11 – 2015-11-13 (×3): 80 mg via ORAL
  Filled 2015-11-09 (×2): qty 4
  Filled 2015-11-09: qty 8

## 2015-11-09 MED ORDER — VANCOMYCIN HCL IN DEXTROSE 1-5 GM/200ML-% IV SOLN
INTRAVENOUS | Status: AC
  Filled 2015-11-09: qty 200

## 2015-11-09 MED ORDER — ASPIRIN-ACETAMINOPHEN-CAFFEINE 250-250-65 MG PO TABS
1.0000 | ORAL_TABLET | Freq: Once | ORAL | Status: AC
  Administered 2015-11-09: 1 via ORAL
  Filled 2015-11-09: qty 1

## 2015-11-09 MED ORDER — ALPRAZOLAM 0.5 MG PO TABS
0.5000 mg | ORAL_TABLET | Freq: Every day | ORAL | Status: DC
  Administered 2015-11-09 – 2015-11-13 (×15): 0.5 mg via ORAL
  Filled 2015-11-09 (×14): qty 1

## 2015-11-09 MED ORDER — SODIUM CHLORIDE 0.9% FLUSH: 3.0000 mL | INTRAVENOUS | Status: DC | PRN

## 2015-11-09 MED ORDER — HEPARIN SODIUM (PORCINE) 5000 UNIT/ML IJ SOLN
5000.0000 [IU] | Freq: Three times a day (TID) | INTRAMUSCULAR | Status: DC
  Administered 2015-11-09 – 2015-11-13 (×7): 5000 [IU] via SUBCUTANEOUS
  Filled 2015-11-09 (×8): qty 1

## 2015-11-09 MED ORDER — POTASSIUM CHLORIDE IN NACL 20-0.9 MEQ/L-% IV SOLN
INTRAVENOUS | Status: DC
  Administered 2015-11-09: 80 mL/h via INTRAVENOUS
  Administered 2015-11-10 – 2015-11-11 (×2): via INTRAVENOUS
  Filled 2015-11-09 (×7): qty 1000

## 2015-11-09 MED ORDER — VANCOMYCIN HCL IN DEXTROSE 1-5 GM/200ML-% IV SOLN: 1000.0000 mg | INTRAVENOUS | Status: DC

## 2015-11-09 MED ORDER — BUDESONIDE 0.5 MG/2ML IN SUSP
0.5000 mg | Freq: Two times a day (BID) | RESPIRATORY_TRACT | Status: DC
  Administered 2015-11-09 – 2015-11-12 (×7): 0.5 mg via RESPIRATORY_TRACT
  Filled 2015-11-09 (×7): qty 2

## 2015-11-09 MED ORDER — CHLORHEXIDINE GLUCONATE CLOTH 2 % EX PADS
6.0000 | MEDICATED_PAD | Freq: Once | CUTANEOUS | Status: DC
Start: 2015-11-09 — End: 2015-11-10

## 2015-11-09 MED ORDER — OXYCODONE HCL 5 MG PO TABS
5.0000 mg | ORAL_TABLET | ORAL | Status: DC | PRN
  Administered 2015-11-09 – 2015-11-11 (×6): 5 mg via ORAL
  Filled 2015-11-09 (×6): qty 1

## 2015-11-09 MED ORDER — CLONIDINE HCL 0.1 MG PO TABS
0.1000 mg | ORAL_TABLET | Freq: Every day | ORAL | Status: DC
  Administered 2015-11-11: 0.1 mg via ORAL
  Filled 2015-11-09 (×2): qty 1

## 2015-11-09 MED ORDER — ACETAMINOPHEN 325 MG PO TABS: 650.0000 mg | ORAL_TABLET | Freq: Four times a day (QID) | ORAL | Status: DC | PRN

## 2015-11-09 MED ORDER — FENTANYL CITRATE (PF) 100 MCG/2ML IJ SOLN
INTRAMUSCULAR | Status: AC
  Administered 2015-11-09: 100 ug
  Filled 2015-11-09: qty 2

## 2015-11-09 MED ORDER — LACTATED RINGERS IV SOLN
INTRAVENOUS | Status: DC
  Administered 2015-11-09: 12:00:00 via INTRAVENOUS

## 2015-11-09 MED ORDER — ACETAMINOPHEN 650 MG RE SUPP: 650.0000 mg | Freq: Four times a day (QID) | RECTAL | Status: DC | PRN

## 2015-11-09 MED ORDER — NITROGLYCERIN 0.4 MG SL SUBL: 0.4000 mg | SUBLINGUAL_TABLET | SUBLINGUAL | Status: DC | PRN

## 2015-11-09 MED ORDER — LAMOTRIGINE 100 MG PO TABS
200.0000 mg | ORAL_TABLET | Freq: Two times a day (BID) | ORAL | Status: DC
  Administered 2015-11-10 – 2015-11-13 (×6): 200 mg via ORAL
  Filled 2015-11-09: qty 1
  Filled 2015-11-09 (×5): qty 2
  Filled 2015-11-09: qty 1
  Filled 2015-11-09 (×2): qty 2

## 2015-11-09 MED ORDER — CLONIDINE HCL 0.1 MG PO TABS: 0.1000 mg | ORAL_TABLET | Freq: Every day | ORAL | Status: DC

## 2015-11-09 NOTE — Progress Notes (Signed)
Attempted to call report again, no answer. Pt has ambulated several times to bathroom to void with minimal assist, has been eating and drinking without difficulty.

## 2015-11-09 NOTE — Progress Notes (Signed)
Pt's surgery rescheduled for tomorrow at 1330. Pt to be admitted per Dr. Christella Noa, awaiting bed placement on 3C. Sandwich and coke given. Pt reporting relief from pain of migraine and back after Excedrin and IV Fentanyl (see MAR).
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
## 2015-11-09 NOTE — Anesthesia Preprocedure Evaluation (Addendum)
Anesthesia Evaluation  Patient identified by MRN, date of birth, ID band Patient awake    Reviewed: Allergy & Precautions, H&P , NPO status , Patient's Chart, lab work & pertinent test results  History of Anesthesia Complications (+) PONV and history of anesthetic complications  Airway Mallampati: II  TM Distance: >3 FB Neck ROM: full    Dental no notable dental hx. (+) Edentulous Upper, Dental Advisory Given   Pulmonary neg pulmonary ROS, Current Smoker,    Pulmonary exam normal breath sounds clear to auscultation       Cardiovascular hypertension, Pt. on medications Normal cardiovascular exam Rhythm:regular Rate:Normal  08/2015 cardiology eval: Recent extensive cardiac testing including echo and heart cath that are overall benign, recommend proceeding with surgery as planned   Neuro/Psych  Headaches, Seizures -,  PSYCHIATRIC DISORDERS Depression Bipolar Disorder    GI/Hepatic Neg liver ROS, GERD  ,  Endo/Other  negative endocrine ROSHypothyroidism   Renal/GU Renal diseasenegative Renal ROS     Musculoskeletal  (+) Arthritis ,   Abdominal   Peds  Hematology negative hematology ROS (+)   Anesthesia Other Findings   Reproductive/Obstetrics negative OB ROS                           Anesthesia Physical Anesthesia Plan  ASA: III  Anesthesia Plan: General   Post-op Pain Management:    Induction: Intravenous  Airway Management Planned: Oral ETT  Additional Equipment:   Intra-op Plan:   Post-operative Plan: Extubation in OR  Informed Consent: I have reviewed the patients History and Physical, chart, labs and discussed the procedure including the risks, benefits and alternatives for the proposed anesthesia with the patient or authorized representative who has indicated his/her understanding and acceptance.   Dental Advisory Given  Plan Discussed with: Anesthesiologist, CRNA and  Surgeon  Anesthesia Plan Comments:         Anesthesia Quick Evaluation

## 2015-11-09 NOTE — Progress Notes (Signed)
Report given to [facility] on [facility]. Pt transported via wheelchair accompanied by this nurse and pt's boyfriend with chart and personal belongings. Condition stable at time of transfer.
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
## 2015-11-09 NOTE — Progress Notes (Signed)
Called 3C to give report, informed by Mardene Celeste, RN, pt to go to [facility] 18 per patient placement. Awaiting room to be cleaned.

## 2015-11-09 NOTE — Progress Notes (Signed)
Pt has bed, 3C room 5. RN unable to take report at this time.

## 2015-11-09 NOTE — Progress Notes (Signed)
Attempted to call report to [facility], remained on hold for 5 minutes with no response, will try again in a few minutes.

## 2015-11-09 NOTE — Progress Notes (Signed)
Pt reports migraine. This nurse called Dr. Jillyn Hidden to inform, orders received for Maxalt per home meds; however, pharmacy does not carry so will send Excedrin migraine which pt reports taking with relief in the past. Awaiting med to arrive from pharmacy.

## 2015-11-10 ENCOUNTER — Inpatient Hospital Stay (HOSPITAL_COMMUNITY): Payer: Medicare Other

## 2015-11-10 ENCOUNTER — Encounter (HOSPITAL_COMMUNITY)
Admission: RE | Disposition: A | Discharge status: Home / Self Care | Payer: Self-pay | Source: Ambulatory Visit | Attending: Neurosurgery

## 2015-11-10 ENCOUNTER — Inpatient Hospital Stay (HOSPITAL_COMMUNITY): Payer: Medicare Other | Admitting: Anesthesiology

## 2015-11-10 ENCOUNTER — Encounter (HOSPITAL_COMMUNITY): Payer: Self-pay | Admitting: Anesthesiology

## 2015-11-10 SURGERY — POSTERIOR LUMBAR FUSION 1 LEVEL
Anesthesia: General | Site: Spine Lumbar

## 2015-11-10 MED ORDER — PROPOFOL 10 MG/ML IV BOLUS
INTRAVENOUS | Status: AC
  Filled 2015-11-10: qty 20

## 2015-11-10 MED ORDER — FENTANYL CITRATE (PF) 100 MCG/2ML IJ SOLN
25.0000 ug | INTRAMUSCULAR | Status: DC | PRN
  Administered 2015-11-10 (×2): 50 ug via INTRAVENOUS

## 2015-11-10 MED ORDER — ONDANSETRON HCL 4 MG/2ML IJ SOLN
INTRAMUSCULAR | Status: AC
  Filled 2015-11-10: qty 2

## 2015-11-10 MED ORDER — DOCUSATE SODIUM 100 MG PO CAPS
100.0000 mg | ORAL_CAPSULE | Freq: Two times a day (BID) | ORAL | Status: DC
  Administered 2015-11-10 – 2015-11-13 (×6): 100 mg via ORAL
  Filled 2015-11-10 (×7): qty 1

## 2015-11-10 MED ORDER — SCOPOLAMINE 1 MG/3DAYS TD PT72
MEDICATED_PATCH | TRANSDERMAL | Status: DC | PRN
  Administered 2015-11-10: 1 via TRANSDERMAL

## 2015-11-10 MED ORDER — FENTANYL CITRATE (PF) 100 MCG/2ML IJ SOLN
INTRAMUSCULAR | Status: DC | PRN
  Administered 2015-11-10 (×2): 50 ug via INTRAVENOUS
  Administered 2015-11-10: 100 ug via INTRAVENOUS
  Administered 2015-11-10: 50 ug via INTRAVENOUS
  Administered 2015-11-10: 100 ug via INTRAVENOUS
  Administered 2015-11-10: 50 ug via INTRAVENOUS
  Administered 2015-11-10 (×2): 100 ug via INTRAVENOUS
  Administered 2015-11-10: 50 ug via INTRAVENOUS

## 2015-11-10 MED ORDER — DIPHENHYDRAMINE HCL 50 MG/ML IJ SOLN
INTRAMUSCULAR | Status: DC | PRN
  Administered 2015-11-10: 25 mg via INTRAVENOUS

## 2015-11-10 MED ORDER — MORPHINE SULFATE (PF) 2 MG/ML IV SOLN
1.0000 mg | INTRAVENOUS | Status: DC | PRN
  Administered 2015-11-10: 4 mg via INTRAVENOUS
  Administered 2015-11-10 – 2015-11-11 (×4): 2 mg via INTRAVENOUS
  Filled 2015-11-10: qty 1
  Filled 2015-11-10: qty 2
  Filled 2015-11-10 (×3): qty 1

## 2015-11-10 MED ORDER — DEXAMETHASONE SODIUM PHOSPHATE 10 MG/ML IJ SOLN
INTRAMUSCULAR | Status: AC
  Filled 2015-11-10: qty 1

## 2015-11-10 MED ORDER — FENTANYL CITRATE (PF) 250 MCG/5ML IJ SOLN
INTRAMUSCULAR | Status: AC
  Filled 2015-11-10: qty 5

## 2015-11-10 MED ORDER — PROMETHAZINE HCL 25 MG/ML IJ SOLN: 6.2500 mg | INTRAMUSCULAR | Status: DC | PRN

## 2015-11-10 MED ORDER — DEXAMETHASONE SODIUM PHOSPHATE 10 MG/ML IJ SOLN
INTRAMUSCULAR | Status: DC | PRN
  Administered 2015-11-10: 10 mg via INTRAVENOUS

## 2015-11-10 MED ORDER — ROCURONIUM BROMIDE 50 MG/5ML IV SOLN
INTRAVENOUS | Status: AC
  Filled 2015-11-10: qty 1

## 2015-11-10 MED ORDER — PROPOFOL 10 MG/ML IV BOLUS
INTRAVENOUS | Status: DC | PRN
  Administered 2015-11-10: 120 mg via INTRAVENOUS

## 2015-11-10 MED ORDER — DIAZEPAM 5 MG PO TABS
5.0000 mg | ORAL_TABLET | Freq: Four times a day (QID) | ORAL | Status: DC | PRN
  Administered 2015-11-10 – 2015-11-11 (×3): 5 mg via ORAL
  Filled 2015-11-10 (×2): qty 1

## 2015-11-10 MED ORDER — SODIUM CHLORIDE 0.9 % IV SOLN: 250.0000 mL | INTRAVENOUS | Status: DC

## 2015-11-10 MED ORDER — MIDAZOLAM HCL 2 MG/2ML IJ SOLN
INTRAMUSCULAR | Status: AC
  Filled 2015-11-10: qty 2

## 2015-11-10 MED ORDER — FENTANYL CITRATE (PF) 100 MCG/2ML IJ SOLN
INTRAMUSCULAR | Status: AC
  Administered 2015-11-10: 50 ug via INTRAVENOUS
  Filled 2015-11-10: qty 2

## 2015-11-10 MED ORDER — ONDANSETRON HCL 4 MG/2ML IJ SOLN
4.0000 mg | INTRAMUSCULAR | Status: DC | PRN
  Administered 2015-11-10 – 2015-11-11 (×2): 4 mg via INTRAVENOUS
  Filled 2015-11-10 (×2): qty 2

## 2015-11-10 MED ORDER — MIDAZOLAM HCL 5 MG/5ML IJ SOLN
INTRAMUSCULAR | Status: DC | PRN
  Administered 2015-11-10: 2 mg via INTRAVENOUS

## 2015-11-10 MED ORDER — LACTATED RINGERS IV SOLN
INTRAVENOUS | Status: DC | PRN
  Administered 2015-11-10 (×3): via INTRAVENOUS

## 2015-11-10 MED ORDER — ACETAMINOPHEN 325 MG PO TABS
650.0000 mg | ORAL_TABLET | ORAL | Status: DC | PRN
  Administered 2015-11-11: 650 mg via ORAL
  Filled 2015-11-10: qty 2

## 2015-11-10 MED ORDER — BUPIVACAINE-EPINEPHRINE 0.5% -1:200000 IJ SOLN
INTRAMUSCULAR | Status: DC | PRN
  Administered 2015-11-10: 20 mL

## 2015-11-10 MED ORDER — ACETAMINOPHEN 650 MG RE SUPP: 650.0000 mg | RECTAL | Status: DC | PRN

## 2015-11-10 MED ORDER — SUGAMMADEX SODIUM 200 MG/2ML IV SOLN
INTRAVENOUS | Status: AC
  Filled 2015-11-10: qty 2

## 2015-11-10 MED ORDER — ROCURONIUM BROMIDE 100 MG/10ML IV SOLN
INTRAVENOUS | Status: DC | PRN
  Administered 2015-11-10: 50 mg via INTRAVENOUS

## 2015-11-10 MED ORDER — THROMBIN 20000 UNITS EX SOLR
CUTANEOUS | Status: DC | PRN
  Administered 2015-11-10: 15:00:00 via TOPICAL

## 2015-11-10 MED ORDER — SENNOSIDES-DOCUSATE SODIUM 8.6-50 MG PO TABS: 1.0000 | ORAL_TABLET | Freq: Every evening | ORAL | Status: DC | PRN

## 2015-11-10 MED ORDER — SUGAMMADEX SODIUM 200 MG/2ML IV SOLN
INTRAVENOUS | Status: DC | PRN
  Administered 2015-11-10: 140 mg via INTRAVENOUS

## 2015-11-10 MED ORDER — OXYCODONE HCL 5 MG PO TABS
ORAL_TABLET | ORAL | Status: AC
  Administered 2015-11-10: 5 mg via ORAL
  Filled 2015-11-10: qty 1

## 2015-11-10 MED ORDER — PHENOL 1.4 % MT LIQD
1.0000 | OROMUCOSAL | Status: DC | PRN
Start: 2015-11-10 — End: 2015-11-13

## 2015-11-10 MED ORDER — EPHEDRINE SULFATE 50 MG/ML IJ SOLN
INTRAMUSCULAR | Status: DC | PRN
  Administered 2015-11-10: 10 mg via INTRAVENOUS

## 2015-11-10 MED ORDER — SODIUM CHLORIDE 0.9% FLUSH
3.0000 mL | Freq: Two times a day (BID) | INTRAVENOUS | Status: DC
  Administered 2015-11-11 – 2015-11-12 (×2): 3 mL via INTRAVENOUS

## 2015-11-10 MED ORDER — SODIUM CHLORIDE 0.9% FLUSH: 3.0000 mL | INTRAVENOUS | Status: DC | PRN

## 2015-11-10 MED ORDER — DIAZEPAM 5 MG PO TABS
ORAL_TABLET | ORAL | Status: AC
  Administered 2015-11-10: 5 mg via ORAL
  Filled 2015-11-10: qty 1

## 2015-11-10 MED ORDER — MENTHOL 3 MG MT LOZG: 1.0000 | LOZENGE | OROMUCOSAL | Status: DC | PRN

## 2015-11-10 MED ORDER — BISACODYL 5 MG PO TBEC
5.0000 mg | DELAYED_RELEASE_TABLET | Freq: Every day | ORAL | Status: DC | PRN
Start: 2015-11-10 — End: 2015-11-13

## 2015-11-10 MED ORDER — MAGNESIUM CITRATE PO SOLN: 1.0000 | Freq: Once | ORAL | Status: DC | PRN

## 2015-11-10 MED ORDER — 0.9 % SODIUM CHLORIDE (POUR BTL) OPTIME
TOPICAL | Status: DC | PRN
  Administered 2015-11-10: 1000 mL

## 2015-11-10 MED ORDER — VANCOMYCIN HCL 1000 MG IV SOLR
1000.0000 mg | INTRAVENOUS | Status: DC | PRN
  Administered 2015-11-10: 1000 mg via INTRAVENOUS

## 2015-11-10 SURGICAL SUPPLY — 57 items
APL SKNCLS STERI-STRIP NONHPOA (GAUZE/BANDAGES/DRESSINGS)
BENZOIN TINCTURE PRP APPL 2/3 (GAUZE/BANDAGES/DRESSINGS) IMPLANT
BLADE CLIPPER SURG (BLADE) IMPLANT
BUR MATCHSTICK NEURO 3.0 LAGG (BURR) ×2 IMPLANT
BUR PRECISION FLUTE 5.0 (BURR) ×2 IMPLANT
CAGE CONCORDE BULLET 9X9X23 (Cage) ×4 IMPLANT
CAGE SPNL PRLL BLT NOSE 23X9X9 (Cage) ×2 IMPLANT
CANISTER SUCT 3000ML PPV (MISCELLANEOUS) ×2 IMPLANT
CONT SPEC 4OZ CLIKSEAL STRL BL (MISCELLANEOUS) ×2 IMPLANT
COVER BACK TABLE 60X90IN (DRAPES) ×2 IMPLANT
DECANTER SPIKE VIAL GLASS SM (MISCELLANEOUS) IMPLANT
DRAPE C-ARM 42X72 X-RAY (DRAPES) ×4 IMPLANT
DRAPE C-ARMOR (DRAPES) IMPLANT
DRAPE LAPAROTOMY 100X72X124 (DRAPES) ×2 IMPLANT
DRAPE POUCH INSTRU U-SHP 10X18 (DRAPES) ×2 IMPLANT
DRAPE PROXIMA HALF (DRAPES) ×4 IMPLANT
DRAPE SURG 17X23 STRL (DRAPES) ×2 IMPLANT
DRSG OPSITE POSTOP 4X6 (GAUZE/BANDAGES/DRESSINGS) ×2 IMPLANT
DURAPREP 26ML APPLICATOR (WOUND CARE) ×2 IMPLANT
ELECT REM PT RETURN 9FT ADLT (ELECTROSURGICAL) ×2
ELECTRODE REM PT RTRN 9FT ADLT (ELECTROSURGICAL) ×1 IMPLANT
GAUZE SPONGE 4X4 12PLY STRL (GAUZE/BANDAGES/DRESSINGS) IMPLANT
GAUZE SPONGE 4X4 16PLY XRAY LF (GAUZE/BANDAGES/DRESSINGS) IMPLANT
GLOVE BIOGEL PI IND STRL 7.5 (GLOVE) ×3 IMPLANT
GLOVE BIOGEL PI INDICATOR 7.5 (GLOVE) ×3
GLOVE ECLIPSE 6.5 STRL STRAW (GLOVE) ×6 IMPLANT
GOWN STRL REUS W/ TWL LRG LVL3 (GOWN DISPOSABLE) ×2 IMPLANT
GOWN STRL REUS W/ TWL XL LVL3 (GOWN DISPOSABLE) ×1 IMPLANT
GOWN STRL REUS W/TWL 2XL LVL3 (GOWN DISPOSABLE) IMPLANT
GOWN STRL REUS W/TWL LRG LVL3 (GOWN DISPOSABLE) ×4
GOWN STRL REUS W/TWL XL LVL3 (GOWN DISPOSABLE) ×2
KIT BASIN OR (CUSTOM PROCEDURE TRAY) ×2 IMPLANT
KIT POSITION SURG JACKSON T1 (MISCELLANEOUS) ×2 IMPLANT
KIT ROOM TURNOVER OR (KITS) ×2 IMPLANT
LIQUID BAND (GAUZE/BANDAGES/DRESSINGS) ×2 IMPLANT
MILL MEDIUM DISP (BLADE) ×2 IMPLANT
NEEDLE HYPO 25X1 1.5 SAFETY (NEEDLE) ×2 IMPLANT
NEEDLE SPNL 18GX3.5 QUINCKE PK (NEEDLE) ×2 IMPLANT
NS IRRIG 1000ML POUR BTL (IV SOLUTION) ×2 IMPLANT
PACK LAMINECTOMY NEURO (CUSTOM PROCEDURE TRAY) ×2 IMPLANT
PAD ARMBOARD 7.5X6 YLW CONV (MISCELLANEOUS) ×4 IMPLANT
ROD RELINE COCR LORD 5.0X25 (Rod) ×4 IMPLANT
SCREW LOCK RSS 4.5/5.0MM (Screw) ×8 IMPLANT
SCREW RELINE RMM 5.5X45 4S (Screw) ×4 IMPLANT
SCREW RELINE RMM 6.5X40 4S (Screw) ×4 IMPLANT
SPONGE LAP 4X18 X RAY DECT (DISPOSABLE) IMPLANT
SPONGE SURGIFOAM ABS GEL 100 (HEMOSTASIS) ×2 IMPLANT
STRIP CLOSURE SKIN 1/2X4 (GAUZE/BANDAGES/DRESSINGS) IMPLANT
SUT PROLENE 6 0 BV (SUTURE) IMPLANT
SUT VIC AB 0 CT1 18XCR BRD8 (SUTURE) ×1 IMPLANT
SUT VIC AB 0 CT1 8-18 (SUTURE) ×2
SUT VIC AB 2-0 CT1 18 (SUTURE) ×2 IMPLANT
SUT VIC AB 3-0 SH 8-18 (SUTURE) ×4 IMPLANT
TOWEL OR 17X24 6PK STRL BLUE (TOWEL DISPOSABLE) ×2 IMPLANT
TOWEL OR 17X26 10 PK STRL BLUE (TOWEL DISPOSABLE) ×2 IMPLANT
TRAY FOLEY W/METER SILVER 16FR (SET/KITS/TRAYS/PACK) ×2 IMPLANT
WATER STERILE IRR 1000ML POUR (IV SOLUTION) ×2 IMPLANT

## 2015-11-10 NOTE — Op Note (Signed)
11/09/2015 - 11/10/2015  7:02 PM  PATIENT:  Brooke Maddox  45 y.o. female  PRE-OPERATIVE DIAGNOSIS:  L5/S1 spondylolisthesis, osteoarthritis with radiculopathy  POST-OPERATIVE DIAGNOSIS:  L5/S1 spondylolisthesis, osteoarthritis with radiculopathy  PROCEDURE:  Procedure(s): 1. L5 gill procedure 2. Posterior lumbar interbody arthrodesis, 68mm carbon fibre cages with autograft morsels x2( depuy) 3. Non segmental pedicle screw fixation 6.11mm x24mm in S1, 5.25mm x 67mm in L5 SURGEON:  Surgeon(s): Ashok Pall, MD Eustace Moore, MD  ASSISTANTS:Jones, Shanon Brow  ANESTHESIA:   general  EBL:     BLOOD ADMINISTERED:none  CELL SAVER GIVEN:none  COUNT:per nursing  DRAINS: none   SPECIMEN:  No Specimen  DICTATION: [name] is a 45 y.o. female whom was taken to the operating room intubated, and placed under a general anesthetic without difficulty. A foley catheter was placed under sterile conditions. [name] was positioned prone on a Jackson stable with all pressure points properly padded.  Her lumbar region was prepped and draped in a sterile manner. I infiltrated 10cc's 1/2%lidocaine/1:2000,000 strength epinephrine into the planned incision. I opened the skin with a 10 blade and took the incision down to the thoracolumbar fascia. I exposed the lamina of L5, and S1 in a subperiosteal fashion bilaterally. I confirmed my location with an intraoperative xray.  I placed self retaining retractors and started the decompression.  I decompressed the spinal canal via a gill procedure at L5. I used the Advertising account executive punches to complete the [employer]. I used the Kerrison punches to unroof and decompress the L5 roots bilaterally through the neural foramina.  PLIF's were performed at L5/S1 in the same fashion. I opened the disc space with a 15 blade then used a variety of instruments to remove the disc and prepare the space for the arthrodesis. I used curettes, rongeurs, punches, shavers for the disc  space, and rasps in the discetomy. I measured the disc space and placed 54mm Carbon fiber cages(Depuy) into the disc space(s). The cages were packed with autograft morsels.  We placed pedicle screws at L5 and S1, using fluoroscopic guidance. I drilled a pilot hole, then cannulated the pedicle with a bone probe at each site. We then tapped each pedicle, assessing each site for pedicle violations. No cutouts were appreciated. Screws (Nuvasive mas plif) were then placed at each site without difficulty. Final films were performed and all screws appeared to be in good position. Rods were placed through the screw heads and locking caps placed to secure the construct.  We closed the wound in a layered fashion. I approximated the thoracolumbar fascia, subcutaneous, and subcuticular planes with vicryl sutures. I used dermabond, and an occlusive bandage for a sterile dressing.     PLAN OF CARE: Admit to inpatient   PATIENT DISPOSITION:  PACU - hemodynamically stable.   Delay start of Pharmacological VTE agent (>24hrs) due to surgical blood loss or risk of bleeding:  yes

## 2015-11-10 NOTE — Progress Notes (Signed)
Patient ID: Brooke Maddox, female   DOB: [birth year], 46 y.o.   MRN: [id number] BP 99/59 mmHg  Pulse 55  Temp(Src) 97.6 F (36.4 C) (Oral)  Resp 18  Wt 69.4 kg (153 lb)  SpO2 100% Alert and oriented x 4 Moving all extremities well

## 2015-11-10 NOTE — Transfer of Care (Signed)
Immediate Anesthesia Transfer of Care Note  Patient: Brooke Maddox  Procedure(s) Performed: Procedure(s): LUMBAR FIVE-SACRAL ONE POSTERIOR LUMBAR FUSION  (N/A)  Patient Location: PACU  Anesthesia Type:General  Level of Consciousness: awake, alert  and patient cooperative  Airway & Oxygen Therapy: Patient Spontanous Breathing and Patient connected to nasal cannula oxygen  Post-op Assessment: Report given to RN and Post -op Vital signs reviewed and stable  Post vital signs: Reviewed and stable  Last Vitals:  Filed Vitals:   11/10/15 0634 11/10/15 1007  BP: 115/57 99/59  Pulse: 58 55  Temp: 36.8 C 36.4 C  Resp: 18 18    Last Pain:  Filed Vitals:   11/10/15 1050  PainSc: 3       Patients Stated Pain Goal: 3 ([id number])  Complications: No apparent anesthesia complications

## 2015-11-10 NOTE — Progress Notes (Signed)
Patient arrived to 5C18 AAOx4 and drowsy. She has 8/10 pain in her back. She's able to move all extremeties at this time and does not have any numbness or tingling. Will continue to monitor. Gerrett Loman, Rande Brunt, RN

## 2015-11-10 NOTE — Progress Notes (Signed)
Patient left room 5C18 for OR at this time. Alert and in stable condition.

## 2015-11-10 NOTE — Care Management Important Message (Signed)
Important Message  Patient Details  Name: Brooke Maddox MRN: [id number] Date of Birth: [birth year]   Medicare Important Message Given:  Yes    [name], Leroy Sea 11/10/2015, 1:33 PM

## 2015-11-10 NOTE — Anesthesia Procedure Notes (Signed)
Procedure Name: Intubation Date/Time: 11/10/2015 2:03 PM Performed by: Rebekah Chesterfield L Pre-anesthesia Checklist: Patient identified, Emergency Drugs available, Suction available and Patient being monitored Patient Re-evaluated:Patient Re-evaluated prior to inductionOxygen Delivery Method: Circle System Utilized Preoxygenation: Pre-oxygenation with 100% oxygen Intubation Type: IV induction Ventilation: Mask ventilation without difficulty Laryngoscope Size: Mac and 3 Grade View: Grade II Tube type: Oral Tube size: 7.5 mm Number of attempts: 1 Airway Equipment and Method: Stylet and Oral airway Placement Confirmation: ETT inserted through vocal cords under direct vision,  positive ETCO2 and breath sounds checked- equal and bilateral Secured at: 20 cm Tube secured with: Tape Dental Injury: Teeth and Oropharynx as per pre-operative assessment

## 2015-11-11 MED ORDER — OXYCODONE HCL 5 MG PO TABS
5.0000 mg | ORAL_TABLET | ORAL | Status: DC | PRN
  Administered 2015-11-11 – 2015-11-12 (×3): 10 mg via ORAL
  Administered 2015-11-12: 5 mg via ORAL
  Administered 2015-11-12 (×2): 10 mg via ORAL
  Administered 2015-11-13 (×3): 5 mg via ORAL
  Filled 2015-11-11: qty 2
  Filled 2015-11-11 (×2): qty 1
  Filled 2015-11-11 (×3): qty 2
  Filled 2015-11-11: qty 1
  Filled 2015-11-11: qty 2
  Filled 2015-11-11: qty 1

## 2015-11-11 MED ORDER — KETOROLAC TROMETHAMINE 30 MG/ML IJ SOLN
30.0000 mg | Freq: Four times a day (QID) | INTRAMUSCULAR | Status: DC
  Administered 2015-11-11 – 2015-11-13 (×6): 30 mg via INTRAVENOUS
  Filled 2015-11-11 (×6): qty 1

## 2015-11-11 NOTE — Progress Notes (Signed)
Patient ID: Brooke Maddox, female   DOB: [birth year], 45 y.o.   MRN: [id number] BP 145/87 mmHg  Pulse 71  Temp(Src) 91.4 F (33 C) (Oral)  Resp 18  Wt 69.4 kg (153 lb)  SpO2 100% Alert and oriented x 4, speech is clear and fluent Moving all extremities well Has had some urinary hesitancy Wound is clean, dry, no signs of infection

## 2015-11-11 NOTE — Evaluation (Signed)
Occupational Therapy Evaluation Patient Details Name: Brooke Maddox MRN: [id number] DOB: 08-Oct-1970 Today's Date: 11/11/2015    History of Present Illness Candice PORSHA ARAGON is a 45 y.o. female s/p L5/S1 fixation. PMH includes: HLD, COPD, renal insufficiency, hypothryoidism, OA, bipolar affective disorder.   Clinical Impression   Pt typically functions at a modified independent to independent level in ADL and mobility. Presents with increased back pain and poor activity tolerance. Began education in back precautions related to ADL and mobility with handout provided to reinforce. Will follow acutely.    Follow Up Recommendations  No OT follow up    Equipment Recommendations  None recommended by OT (pt does not want a 3 in 1)    Recommendations for Other Services       Precautions / Restrictions Precautions Precautions: Back Precaution Booklet Issued: Yes (comment) Precaution Comments: educated and reviewed handout Required Braces or Orthoses: Spinal Brace Spinal Brace: Lumbar corset Restrictions Weight Bearing Restrictions: No      Mobility Bed Mobility Overal bed mobility: Needs Assistance Bed Mobility: Rolling;Sidelying to Sit;Sit to Sidelying Rolling: Supervision Sidelying to sit: Min guard     Sit to sidelying: Min guard General bed mobility comments: pt instructed in log roll technique  Transfers Overall transfer level: Needs assistance Equipment used: Rolling walker (2 wheeled) Transfers: Sit to/from Stand Sit to Stand: Min assist         General transfer comment: steadying assist    Balance Overall balance assessment: Needs assistance Sitting-balance support: No upper extremity supported;Feet supported Sitting balance-Leahy Scale: Good Sitting balance - Comments: Pt able to mod assist with donning of lumbar brace    Standing balance support: Bilateral upper extremity supported;During functional activity Standing balance-Leahy Scale: Fair                               ADL Overall ADL's : Needs assistance/impaired Eating/Feeding: Independent;Sitting   Grooming: Wash/dry hands;Standing;Min guard   Upper Body Bathing: Set up;Sitting   Lower Body Bathing: Minimal assistance;Sit to/from stand   Upper Body Dressing : Minimal assistance;Sitting Upper Body Dressing Details (indicate cue type and reason): for brace Lower Body Dressing: Minimal assistance;Sit to/from stand   Toilet Transfer: Minimal assistance;Ambulation;RW   Toileting- Clothing Manipulation and Hygiene: Minimal assistance;Sit to/from stand Toileting - Clothing Manipulation Details (indicate cue type and reason): assist for clothing, performed pericare with set up     Functional mobility during ADLs: Minimal assistance;Rolling walker General ADL Comments: Pt typically able to cross foot over opposite knee, limited today by pain.     Vision     Perception     Praxis      Pertinent Vitals/Pain Pain Assessment: 0-10 Pain Score: 10-Worst pain ever Faces Pain Scale: Hurts whole lot Pain Location: back Pain Descriptors / Indicators: Aching Pain Intervention(s): Limited activity within patient's tolerance;Monitored during session;Premedicated before session;Repositioned     Hand Dominance Right   Extremity/Trunk Assessment Upper Extremity Assessment Upper Extremity Assessment: Overall WFL for tasks assessed   Lower Extremity Assessment Lower Extremity Assessment: Defer to PT evaluation   Cervical / Trunk Assessment Cervical / Trunk Assessment: Other exceptions (s/p lumbar surgery)   Communication Communication Communication: No difficulties   Cognition Arousal/Alertness: Awake/alert Behavior During Therapy: Anxious Overall Cognitive Status: Within Functional Limits for tasks assessed                     General Comments  Exercises       Shoulder Instructions      Home Living Family/patient expects to be discharged to:: Private  residence Living Arrangements: Spouse/significant other Available Help at Discharge: Family;Friend(s) Type of Home: House Home Access: Ramped entrance     Home Layout: Two level;Able to live on main level with bedroom/bathroom Alternate Level Stairs-Number of Steps:  (Pt does not go upstairs.)   Bathroom Shower/Tub: Tub/shower unit;Walk-in shower (uses the tub)   Bathroom Toilet: Standard     Home Equipment: Environmental consultant - 2 wheels;Shower seat          Prior Functioning/Environment Level of Independence: Independent             OT Diagnosis: Generalized weakness;Acute pain   OT Problem List: Decreased activity tolerance;Impaired balance (sitting and/or standing);Decreased knowledge of use of DME or AE;Decreased knowledge of precautions;Pain   OT Treatment/Interventions: Self-care/ADL training;DME and/or AE instruction;Balance training;Patient/family education    OT Goals(Current goals can be found in the care plan section) Acute Rehab OT Goals Patient Stated Goal: to decrease pain in legs and back  OT Goal Formulation: With patient Time For Goal Achievement: 11/18/15 Potential to Achieve Goals: Good ADL Goals Pt Will Perform Grooming: with modified independence;standing Pt Will Perform Lower Body Bathing: with modified independence;sit to/from stand Pt Will Perform Lower Body Dressing: with modified independence;sit to/from stand Pt Will Transfer to Toilet: with modified independence;ambulating;regular height toilet Pt Will Perform Toileting - Clothing Manipulation and hygiene: with modified independence;sit to/from stand Pt Will Perform Tub/Shower Transfer: Tub transfer;ambulating;shower seat;rolling walker;with supervision  OT Frequency: Min 2X/week   Barriers to D/C:            Co-evaluation              End of Session Equipment Utilized During Treatment: Rolling walker;Back brace;Gait belt  Activity Tolerance: Patient limited by pain Patient left: in  bed;with call bell/phone within reach;with bed alarm set   Time: [id number] OT Time Calculation (min): 13 min Charges:  OT General Charges $OT Visit: 1 Procedure OT Evaluation $OT Eval Moderate Complexity: 1 Procedure G-Codes:    Malka So 11/11/2015, 3:42 PM  [phone]

## 2015-11-11 NOTE — Progress Notes (Addendum)
Patient noted not tolerating solid food at this time. She has 2 episodes of vomiting of yellowish/green emesis. Zofran given. Clear liquid ordered per order.   Ave Filter, RN

## 2015-11-11 NOTE — Care Management Important Message (Signed)
Important Message  Patient Details  Name: ARLY BLEE MRN: [id number] Date of Birth: 09-13-1970   Medicare Important Message Given:  Yes    Lakeyshia Tuckerman, Leroy Sea 11/11/2015, 8:22 AM

## 2015-11-11 NOTE — Evaluation (Signed)
Physical Therapy Evaluation Patient Details Name: Brooke Maddox MRN: [id number] DOB: Aug 19, 1970 Today's Date: 11/11/2015   History of Present Illness  Brooke Maddox is a 45 y.o. female s/p L5/S1 fixation. PMH includes: HLD, COPD, renal insufficiency, hypothryoidism, OA, bipolar affective disorder.  Clinical Impression  Pt admitted s/p lumbar surgery. She performed bed mobility and transfers sit<>stand min guard. Pt ambulated 75 ft min guard with RW requiring 2 rest breaks for fatigue. She was in significant pain but stated that walking felt better than being in bed. Pt was educated on back precautions but would benefit from handout and reinforcement of precautions during functional activity. She lives at home with her boyfriend who will be there for the first week with her full time. Pt lives on the first floor of her house and does not go upstairs. Continue acute rehab for safety, mobility, strength, and gait training.     Follow Up Recommendations Supervision for mobility/OOB;Home health PT    Equipment Recommendations  3in1 (PT)    Recommendations for Other Services       Precautions / Restrictions Precautions Precautions: Back Precaution Booklet Issued: No Precaution Comments: Pt educated on precautions but could benefit from handout and reinforcement. Required Braces or Orthoses: Spinal Brace Spinal Brace: Lumbar corset Restrictions Weight Bearing Restrictions: No      Mobility  Bed Mobility Overal bed mobility: Needs Assistance Bed Mobility: Rolling;Sidelying to Sit Rolling: Min guard Sidelying to sit: Min guard          Transfers Overall transfer level: Needs assistance Equipment used: Rolling walker (2 wheeled) Transfers: Sit to/from Stand Sit to Stand: Min assist;Min guard         General transfer comment: Pt required min guard to min A for power up, and mod verbal cues to maintain precautions sit<>stand,  Ambulation/Gait Ambulation/Gait assistance: Min  guard Ambulation Distance (Feet): 75 Feet (with 2 rest breaks) Assistive device: Rolling walker (2 wheeled) Gait Pattern/deviations: Step-through pattern;Decreased stride length;Narrow base of support   Gait velocity interpretation: Below normal speed for age/gender General Gait Details: Pt with quad weakness and instability during ambuation.   Stairs            Wheelchair Mobility    Modified Rankin (Stroke Patients Only)       Balance Overall balance assessment: Needs assistance Sitting-balance support: No upper extremity supported;Feet supported Sitting balance-Leahy Scale: Good Sitting balance - Comments: Pt able to mod assist with donning of lumbar brace    Standing balance support: Bilateral upper extremity supported;During functional activity Standing balance-Leahy Scale: Fair                               Pertinent Vitals/Pain Pain Assessment: Faces Faces Pain Scale: Hurts whole lot Pain Location: surgical site, legs Pain Descriptors / Indicators: Operative site guarding;Discomfort Pain Intervention(s): Premedicated before session;Monitored during session    [place] expects to be discharged to:: Private residence Living Arrangements: Spouse/significant other Available Help at Discharge: Family;Friend(s) Type of Home: House Home Access: Ramped entrance     Home Layout: Two level;Able to live on main level with bedroom/bathroom Home Equipment: Gilford Rile - 2 wheels;Tub bench      Prior Function Level of Independence: Independent               Hand Dominance        Extremity/Trunk Assessment   Upper Extremity Assessment: Overall WFL for tasks assessed  Lower Extremity Assessment: Generalized weakness      Cervical / Trunk Assessment: Other exceptions (s/p lumbar surgery)  Communication   Communication: No difficulties  Cognition Arousal/Alertness: Awake/alert Behavior During Therapy:  Anxious Overall Cognitive Status: Within Functional Limits for tasks assessed                      General Comments General comments (skin integrity, edema, etc.): PT took pt to bathroom to urinate before ambulation.     Exercises        Assessment/Plan    PT Assessment Patient needs continued PT services  PT Diagnosis Difficulty walking;Abnormality of gait;Generalized weakness;Acute pain   PT Problem List Decreased strength;Decreased range of motion;Decreased activity tolerance;Decreased balance;Decreased mobility;Decreased coordination;Decreased knowledge of use of DME;Decreased safety awareness;Decreased knowledge of precautions;Pain  PT Treatment Interventions DME instruction;Gait training;Functional mobility training;Stair training;Therapeutic activities;Therapeutic exercise;Balance training;Neuromuscular re-education;Patient/family education   PT Goals (Current goals can be found in the Care Plan section) Acute Rehab PT Goals Patient Stated Goal: to decrease pain in legs and back  PT Goal Formulation: With patient Time For Goal Achievement: 11/18/15 Potential to Achieve Goals: Good    Frequency Min 5X/week   Barriers to discharge        Co-evaluation               End of Session Equipment Utilized During Treatment: Gait belt Activity Tolerance: Patient limited by pain;Patient limited by fatigue Patient left: in chair;with call bell/phone within reach Nurse Communication: Mobility status         Time: [id number] PT Time Calculation (min) (ACUTE ONLY): 26 min   Charges:   PT Evaluation $PT Eval Moderate Complexity: 1 Procedure PT Treatments $Gait Training: 8-22 mins   PT G Codes:        Tarron Krolak November 26, 2015, 3:00 PM  Tawni Millers, SPT (student physical therapist) Acute Rehabilitation Services [phone]

## 2015-11-11 NOTE — Progress Notes (Signed)
Asked to see patient for pain med concerns; pt. Given Morphine 2mg  IV @ 1044 by this Probation officer. Pt. Very anxious, hyperventilating, attempts made to calm the patient down. Pt. Asked to use bedpan, c/o difficulty with urination. Pt. On bedpan ~15 minutes per pt. Request and still unable to urinate.  Bladder Scan obtained and indicated 791cc urine. Pt. In and out cathed using x 3 OB Towelettes due to IODINE ALLERGY. 900 cc clear yellow urine out from in and out cath.  Information reported to nurse taking over care [facility].)

## 2015-11-11 NOTE — Progress Notes (Signed)
Called [employer] Progress Note Patient Details:  Brooke Maddox February 21, 1971 [id number]  Patient ID: Brooke Maddox, female   DOB: 08-09-1970, 45 y.o.   MRN: [id number]   [place] Pink 11/11/2015, 10:45 AMCalled [employer] for [employer].

## 2015-11-12 MED ORDER — SODIUM CHLORIDE 0.9 % IV SOLN
Freq: Once | INTRAVENOUS | Status: AC
  Administered 2015-11-12: 11:00:00 via INTRAVENOUS

## 2015-11-12 NOTE — Progress Notes (Signed)
No acute events Hypotensive overnight No dizziness or lightheadedness Moving legs well Stable Give 1L NS Maybe home tomorrow

## 2015-11-12 NOTE — Progress Notes (Signed)
Physical Therapy Treatment Patient Details Name: Brooke Maddox MRN: [id number] DOB: 26-Sep-1970 Today's Date: 11/12/2015    History of Present Illness Brooke Maddox is a 45 y.o. female s/p L5/S1 fixation. PMH includes: HLD, COPD, renal insufficiency, hypothryoidism, OA, bipolar affective disorder.    PT Comments    Patient tolerated gait training with min guard and no increased pain. Reviewed precautions and pt able to recall 3/3 end of session. Current plan remains appropriate.   Follow Up Recommendations  Supervision for mobility/OOB;Home health PT     Equipment Recommendations  3in1 (PT)    Recommendations for Other Services       Precautions / Restrictions Precautions Precautions: Back Precaution Booklet Issued: No Precaution Comments: reviewed precuations; pt able to recall 3/3 end of session Required Braces or Orthoses: Spinal Brace Spinal Brace: Lumbar corset Restrictions Weight Bearing Restrictions: No    Mobility  Bed Mobility Overal bed mobility: Needs Assistance Bed Mobility: Rolling;Sidelying to Sit Rolling: Supervision Sidelying to sit: Supervision       General bed mobility comments: min cues for sequencing; carry over of technique demonstrated; maintained back precuations; use of rail and increased time  Transfers Overall transfer level: Needs assistance Equipment used: Rolling walker (2 wheeled) Transfers: Sit to/from Stand Sit to Stand: Min guard;Supervision         General transfer comment: min guard first trial from EOB and supervision second trial from 3 in 1; cues for safe hand placement and use of AD  Ambulation/Gait Ambulation/Gait assistance: Min guard Ambulation Distance (Feet): 150 Feet Assistive device: Rolling walker (2 wheeled) Gait Pattern/deviations: Step-through pattern;Decreased stride length;Narrow base of support     General Gait Details: cues for increased bilat step length and heel strike; pt with good posture and maintained  back precautions; unsteady when turning and required min guard for safety   Stairs            Wheelchair Mobility    Modified Rankin (Stroke Patients Only)       Balance Overall balance assessment: Needs assistance Sitting-balance support: No upper extremity supported;Feet supported Sitting balance-Leahy Scale: Good     Standing balance support: Bilateral upper extremity supported;During functional activity Standing balance-Leahy Scale: Fair                      Cognition Arousal/Alertness: Awake/alert Behavior During Therapy: WFL for tasks assessed/performed Overall Cognitive Status: Within Functional Limits for tasks assessed                      Exercises      General Comments        Pertinent Vitals/Pain Pain Assessment: No/denies pain Pain Intervention(s): Monitored during session;Premedicated before session    Home Living                      Prior Function            PT Goals (current goals can now be found in the care plan section) Acute Rehab PT Goals Patient Stated Goal: be more active PT Goal Formulation: With patient Time For Goal Achievement: 11/18/15 Potential to Achieve Goals: Good Progress towards PT goals: Progressing toward goals    Frequency  Min 5X/week    PT Plan Current plan remains appropriate    Co-evaluation             End of Session Equipment Utilized During Treatment: Gait belt Activity Tolerance: Patient tolerated treatment well  Patient left: in chair;with call bell/phone within reach     Time: 0854-0923 PT Time Calculation (min) (ACUTE ONLY): 29 min  Charges:  $Gait Training: 8-22 mins $Therapeutic Activity: 8-22 mins                    G Codes:      [place] April, PTA Pager: [phone]   11/12/2015, 9:32 AM

## 2015-11-12 NOTE — Progress Notes (Signed)
Continue to monitor for low BP; patient is asymptomatic; denies shortness of breath, dizziness, or fatigue; out of bed today times 4 without distress; patient receiving 1 liter of IV fluid per MD orders today; voiding without difficulty; po intake is good. Afternoon xanax and dose of Toradol not given due to low BP; continue to monitor; pain is managed; without acute distress.

## 2015-11-12 NOTE — Progress Notes (Signed)
Occupational Therapy Treatment/Discharge Patient Details Name: Brooke Maddox MRN: [id number] DOB: May 17, 1970 Today's Date: 11/12/2015    History of present illness Elizabet MARQUIS Maddox is a 45 y.o. female s/p L5/S1 fixation. PMH includes: HLD, COPD, renal insufficiency, hypothryoidism, OA, bipolar affective disorder.   OT comments  Pt making good progress towards OT goals. Pt able to complete LB ADLs using taught compensatory strategies and required min guard assist for tub transfer which her boyfriend can provide. Reviewed all back precautions, brace wear protocol, compensatory strategies for LB ADLs and fall prevention strategies. All education has been completed and pt has no further questions. Pt with no further acute OT needs. OT signing off.   Follow Up Recommendations  No OT follow up    Equipment Recommendations  3 in 1 bedside comode    Recommendations for Other Services      Precautions / Restrictions Precautions Precautions: Back Precaution Booklet Issued: Yes (comment) Precaution Comments: Pt able to recall 3/3 back precautions at beginning of session Required Braces or Orthoses: Spinal Brace Spinal Brace: Lumbar corset;Applied in sitting position Restrictions Weight Bearing Restrictions: No       Mobility Bed Mobility               General bed mobility comments: Pt sitting EOB on OT arrival - able to verbally talk through log roll technique  Transfers Overall transfer level: Needs assistance Equipment used: Rolling walker (2 wheeled) Transfers: Sit to/from Stand Sit to Stand: Supervision         General transfer comment: Supervision for safety. VCs for safe hand placement.    Balance Overall balance assessment: Needs assistance Sitting-balance support: No upper extremity supported;Feet supported Sitting balance-Leahy Scale: Good     Standing balance support: No upper extremity supported;During functional activity Standing balance-Leahy Scale: Fair Standing  balance comment: able to maintain balance without UE support for static standing tasks                   ADL Overall ADL's : Needs assistance/impaired     Grooming: Wash/dry hands;Oral care;Supervision/safety;Standing   Upper Body Bathing: Supervision/ safety;Sitting   Lower Body Bathing: Supervison/ safety;Sit to/from stand;Cueing for compensatory techniques Lower Body Bathing Details (indicate cue type and reason): pt able to cross ankle-over-knee Upper Body Dressing : Supervision/safety;Sitting   Lower Body Dressing: Supervision/safety;Cueing for compensatory techniques;Sit to/from stand Lower Body Dressing Details (indicate cue type and reason): able to cross ankle-over-knee Toilet Transfer: Supervision/safety;Ambulation;BSC;RW   Toileting- Water quality scientist and Hygiene: Min guard;Sit to/from stand   Tub/ Shower Transfer: Tub transfer;Min guard;Cueing for safety;Ambulation;Rolling walker Tub/Shower Transfer Details (indicate cue type and reason): advised pt to have boyfriend or son there to assist Functional mobility during ADLs: Min guard;Rolling walker        Vision                     Perception     Praxis      Cognition   Behavior During Therapy: [facility] for tasks assessed/performed Overall Cognitive Status: Within Functional Limits for tasks assessed                       Extremity/Trunk Assessment               Exercises     Shoulder Instructions       General Comments      Pertinent Vitals/ Pain       Pain Assessment: 0-10 Pain Score:  3  Pain Location: back Pain Descriptors / Indicators: Aching;Sore Pain Intervention(s): Limited activity within patient's tolerance;Monitored during session;Repositioned  Home Living                                          Prior Functioning/Environment              Frequency       Progress Toward Goals  OT Goals(current goals can now be found in the care  plan section)  Progress towards OT goals: Goals met/education completed, patient discharged from OT  Acute Rehab OT Goals Patient Stated Goal: be more active OT Goal Formulation: With patient Time For Goal Achievement: 11/18/15 Potential to Achieve Goals: Good ADL Goals Pt Will Perform Grooming: with modified independence;standing Pt Will Perform Lower Body Bathing: with modified independence;sit to/from stand Pt Will Perform Lower Body Dressing: with modified independence;sit to/from stand Pt Will Transfer to Toilet: with modified independence;ambulating;regular height toilet Pt Will Perform Toileting - Clothing Manipulation and hygiene: with modified independence;sit to/from stand Pt Will Perform Tub/Shower Transfer: Tub transfer;ambulating;shower seat;rolling walker;with supervision  Plan All goals met and education completed, patient discharged from OT services    Co-evaluation                 End of Session Equipment Utilized During Treatment: Gait belt;Rolling walker;Back brace   Activity Tolerance Patient tolerated treatment well   Patient Left in chair;with call bell/phone within reach;with chair alarm set   Nurse Communication Mobility status        Time: 5809-9833 OT Time Calculation (min): 18 min  Charges: OT General Charges $OT Visit: 1 Procedure OT Treatments $Self Care/Home Management : 8-22 mins  Redmond Baseman, OTR/L Pager: [phone] 11/12/2015, 3:48 PM

## 2015-11-13 MED ORDER — DIAZEPAM 5 MG PO TABS: 5.0000 mg | ORAL_TABLET | Freq: Four times a day (QID) | ORAL | 0 refills | Status: AC | PRN

## 2015-11-13 MED ORDER — DOCUSATE SODIUM 100 MG PO CAPS: 100.0000 mg | ORAL_CAPSULE | Freq: Two times a day (BID) | ORAL | 0 refills | Status: DC

## 2015-11-13 MED ORDER — OXYCODONE HCL 5 MG PO TABS: 5.0000 mg | ORAL_TABLET | ORAL | 0 refills | Status: DC | PRN

## 2015-11-13 NOTE — Progress Notes (Signed)
PT Cancellation Note  Patient Details Name: Brooke Maddox MRN: [id number] DOB: 28-Jan-1971   Cancelled Treatment:    Reason Eval/Treat Not Completed: Patient declined, no reason specified (had just walked on the hallway with nursing for longer trip ).  Will see tomorrow as able.   Ramond Dial 11/13/2015, 2:30 PM    Mee Hives, PT MS Acute Rehab Dept. Number: [place]

## 2015-11-13 NOTE — Progress Notes (Signed)
No acute events Doing great Home today

## 2015-11-13 NOTE — Care Management Note (Signed)
Case Management Note  Patient Details  Name: Brooke Maddox MRN: [id number] Date of Birth: [birth year]  Subjective/Objective:                   L5/S1 spondylolisthesis, osteoarthritis with radiculopathy Action/Plan: Discharge planning  Expected Discharge Date:   (Pending)               Expected Discharge Plan:  [place]  In-House Referral:     Discharge planning Services  CM Consult  Post Acute Care Choice:    Choice offered to:  Patient  DME Arranged:  3-N-1 DME Agency:  [place]:  PT [place]:  [place]  Status of Service:  Completed, signed off  If discussed at [place] of Stay Meetings, dates discussed:    Additional Comments: Cm met with pt in room to offer choice of home health agency.  Pt chooses [facility] to render HHPT.  Referral called to [facility] rep, tiffany.  Cm called [place] DME rep, Jermaine to please deliver the 3n1 to room so pt can discharge.  NO other CM needs were communicated. Dellie Catholic, RN 11/13/2015, 11:39 AM

## 2015-11-13 NOTE — Discharge Summary (Signed)
Date of Admission: 11/09/2015  Date of Discharge: 11/13/15  PRE-OPERATIVE DIAGNOSIS:  L5/S1 spondylolisthesis, osteoarthritis with radiculopathy  POST-OPERATIVE DIAGNOSIS:  L5/S1 spondylolisthesis, osteoarthritis with radiculopathy  PROCEDURE:  Procedure(s): 1. L5 gill procedure 2. Posterior lumbar interbody arthrodesis, 17mm carbon fibre cages with autograft morsels x2( depuy) 3. Non segmental pedicle screw fixation 6.32mm x23mm in S1, 5.33mm x 84mm in L5  Attending: Ashok Pall, MD  Hospital Course:  The patient was admitted for the above listed operation and had an uncomplicated post-operative course.  They were discharged in stable condition.  Follow up: 3 weeks    Medication List    TAKE these medications   ALPRAZolam 1 MG tablet Commonly known as:  XANAX Take 0.5 mg by mouth 5 (five) times daily.   azelastine 0.1 % nasal spray Commonly known as:  ASTELIN Place 2 sprays into both nostrils 2 (two) times daily.   cloNIDine 0.1 MG tablet Commonly known as:  CATAPRES Take 1 tablet (0.1 mg total) by mouth at bedtime. What changed:  when to take this   diazepam 5 MG tablet Commonly known as:  VALIUM Take 1 tablet (5 mg total) by mouth every 6 (six) hours as needed for muscle spasms.   docusate sodium 100 MG capsule Commonly known as:  COLACE Take 1 capsule (100 mg total) by mouth 2 (two) times daily.   FLUoxetine 40 MG capsule Commonly known as:  PROZAC Take 80 mg by mouth daily.   lamoTRIgine 200 MG tablet Commonly known as:  LAMICTAL Take 200 mg by mouth 2 (two) times daily.   levothyroxine 25 MCG tablet Commonly known as:  SYNTHROID, LEVOTHROID Take 1 tablet (25 mcg total) by mouth daily.   nitroGLYCERIN 0.4 MG SL tablet Commonly known as:  NITROSTAT Place 1 tablet (0.4 mg total) under the tongue every 5 (five) minutes as needed for chest pain.   ondansetron 4 MG tablet Commonly known as:  ZOFRAN Take 1 tablet (4 mg total) by mouth 2 (two) times daily  as needed for nausea or vomiting.   oxyCODONE 5 MG immediate release tablet Commonly known as:  Oxy IR/ROXICODONE Take 1-2 tablets (5-10 mg total) by mouth every 4 (four) hours as needed for moderate pain.   QVAR 40 MCG/ACT inhaler Generic drug:  beclomethasone INHALE 1 PUFF INTO THE LUNGS TWICE DAILY   rizatriptan 5 MG disintegrating tablet Commonly known as:  MAXALT-MLT Take 5 mg by mouth as needed for migraine. May repeat in 2 hours if needed   rosuvastatin 20 MG tablet Commonly known as:  CRESTOR TAKE 1 TABLET BY MOUTH EVERY NIGHT AT BEDTIME   topiramate 25 MG tablet Commonly known as:  TOPAMAX Take 50 mg by mouth daily.

## 2015-11-13 NOTE — Progress Notes (Signed)
Patient ready for discharge home today; discharge instructions given and reviewed; Rx's given; patient awaiting her ride from a friend; patient dressed and wearing her lumbar corset; patient transported out via wheelchair; friend accompanied patient home in private vehicle; staff assisted patient into car.

## 2015-11-14 NOTE — Anesthesia Postprocedure Evaluation (Addendum)
Anesthesia Post Note  Patient: Brooke Maddox  Procedure(s) Performed: Procedure(s) (LRB): LUMBAR FIVE-SACRAL ONE POSTERIOR LUMBAR FUSION (N/A)  Patient location during evaluation: PACU Anesthesia Type: General Level of consciousness: awake and alert Pain management: pain level controlled Vital Signs Assessment: post-procedure vital signs reviewed and stable Respiratory status: spontaneous breathing, nonlabored ventilation, respiratory function stable and patient connected to nasal cannula oxygen Cardiovascular status: blood pressure returned to baseline and stable Postop Assessment: no signs of nausea or vomiting Anesthetic complications: no    Last Vitals:  Vitals:   11/13/15 0541 11/13/15 1026  BP: (!) 94/55 (!) 90/47  Pulse: 74 74  Resp: 18 20  Temp: 36.4 C 36.6 C    Last Pain:  Vitals:   11/13/15 1026  TempSrc: Oral  PainSc:                  Brooke Maddox

## 2015-11-22 ENCOUNTER — Encounter: Payer: Self-pay | Admitting: Family Medicine

## 2015-11-22 ENCOUNTER — Encounter: Payer: Medicare Other | Admitting: Family Medicine

## 2015-12-01 DIAGNOSIS — Q762 Congenital spondylolisthesis: Secondary | ICD-10-CM | POA: Diagnosis not present

## 2015-12-01 DIAGNOSIS — M5417 Radiculopathy, lumbosacral region: Secondary | ICD-10-CM | POA: Diagnosis not present

## 2015-12-04 NOTE — Addendum Note (Signed)
Addendum  created 12/04/15 0551 by Duane Boston, MD   Anesthesia Staff edited

## 2015-12-13 ENCOUNTER — Other Ambulatory Visit: Payer: Self-pay

## 2015-12-13 MED ORDER — BECLOMETHASONE DIPROPIONATE 40 MCG/ACT IN AERS: INHALATION_SPRAY | RESPIRATORY_TRACT | 3 refills | Status: DC

## 2015-12-19 DIAGNOSIS — F319 Bipolar disorder, unspecified: Secondary | ICD-10-CM | POA: Diagnosis not present

## 2015-12-30 DIAGNOSIS — Z23 Encounter for immunization: Secondary | ICD-10-CM | POA: Diagnosis not present

## 2016-01-12 DIAGNOSIS — Q762 Congenital spondylolisthesis: Secondary | ICD-10-CM | POA: Diagnosis not present

## 2016-01-12 DIAGNOSIS — M5417 Radiculopathy, lumbosacral region: Secondary | ICD-10-CM | POA: Diagnosis not present

## 2016-01-12 DIAGNOSIS — Z6827 Body mass index (BMI) 27.0-27.9, adult: Secondary | ICD-10-CM | POA: Diagnosis not present

## 2016-01-19 ENCOUNTER — Ambulatory Visit (INDEPENDENT_AMBULATORY_CARE_PROVIDER_SITE_OTHER): Payer: Medicare Other

## 2016-01-19 VITALS — BP 126/80 | HR 68 | Resp 18 | Ht 63.0 in | Wt 153.1 lb

## 2016-01-19 DIAGNOSIS — Z0000 Encounter for general adult medical examination without abnormal findings: Secondary | ICD-10-CM | POA: Diagnosis not present

## 2016-01-19 NOTE — Patient Instructions (Addendum)
Thank you for choosing [facility] for your health care needs  The Annual Wellness Visit is designed to allow [place] the chance to assist you in preserving and improving you health.   [name] will see you back in 3.5 months   Labs will be ordered at your next visit  If you have any concerns please don't hesitate to call.  The new # is [phone]
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
## 2016-01-24 ENCOUNTER — Other Ambulatory Visit: Payer: Self-pay | Admitting: Family Medicine

## 2016-01-26 ENCOUNTER — Telehealth: Payer: Self-pay

## 2016-01-26 NOTE — Telephone Encounter (Signed)
Yes, whichever her insurance covers

## 2016-01-26 NOTE — Telephone Encounter (Signed)
Qvar no longer covered. Ok to change to alternative: advair or symbicort? Please advise

## 2016-01-27 ENCOUNTER — Other Ambulatory Visit: Payer: Self-pay

## 2016-01-27 MED ORDER — FLUTICASONE-SALMETEROL 100-50 MCG/DOSE IN AEPB: 1.0000 | INHALATION_SPRAY | Freq: Two times a day (BID) | RESPIRATORY_TRACT | 3 refills | Status: DC

## 2016-01-27 MED ORDER — BUDESONIDE-FORMOTEROL FUMARATE 80-4.5 MCG/ACT IN AERO: 2.0000 | INHALATION_SPRAY | Freq: Two times a day (BID) | RESPIRATORY_TRACT | 3 refills | Status: DC

## 2016-01-27 NOTE — Telephone Encounter (Signed)
Med changed

## 2016-01-27 NOTE — Progress Notes (Signed)
[place] [address]

## 2016-02-16 ENCOUNTER — Telehealth: Payer: Self-pay

## 2016-02-16 NOTE — Telephone Encounter (Signed)
Yes pls do

## 2016-02-17 ENCOUNTER — Other Ambulatory Visit: Payer: Self-pay

## 2016-02-17 DIAGNOSIS — Z8709 Personal history of other diseases of the respiratory system: Secondary | ICD-10-CM

## 2016-02-17 MED ORDER — BECLOMETHASONE DIPROPIONATE 40 MCG/ACT IN AERS: 1.0000 | INHALATION_SPRAY | Freq: Two times a day (BID) | RESPIRATORY_TRACT | Status: AC

## 2016-03-06 ENCOUNTER — Other Ambulatory Visit: Payer: Self-pay

## 2016-03-06 DIAGNOSIS — Z6825 Body mass index (BMI) 25.0-25.9, adult: Secondary | ICD-10-CM | POA: Diagnosis not present

## 2016-03-06 DIAGNOSIS — I10 Essential (primary) hypertension: Secondary | ICD-10-CM | POA: Diagnosis not present

## 2016-03-06 DIAGNOSIS — Q762 Congenital spondylolisthesis: Secondary | ICD-10-CM | POA: Diagnosis not present

## 2016-03-06 DIAGNOSIS — M5417 Radiculopathy, lumbosacral region: Secondary | ICD-10-CM | POA: Diagnosis not present

## 2016-03-06 MED ORDER — LEVOTHYROXINE SODIUM 25 MCG PO TABS
25.0000 ug | ORAL_TABLET | Freq: Every day | ORAL | 4 refills | Status: DC
Start: 2016-03-06 — End: 2016-08-20

## 2016-03-06 MED ORDER — CLONIDINE HCL 0.1 MG PO TABS: 0.1000 mg | ORAL_TABLET | Freq: Every day | ORAL | 3 refills | Status: DC

## 2016-03-09 ENCOUNTER — Other Ambulatory Visit: Payer: Self-pay

## 2016-03-09 MED ORDER — BECLOMETHASONE DIPROPIONATE 40 MCG/ACT IN AERS
1.0000 | INHALATION_SPRAY | Freq: Two times a day (BID) | RESPIRATORY_TRACT | 3 refills | Status: DC
Start: 2016-03-09 — End: 2017-02-16

## 2016-03-21 DIAGNOSIS — F319 Bipolar disorder, unspecified: Secondary | ICD-10-CM | POA: Diagnosis not present

## 2016-03-24 ENCOUNTER — Other Ambulatory Visit: Payer: Self-pay | Admitting: Cardiology

## 2016-03-24 DIAGNOSIS — R079 Chest pain, unspecified: Secondary | ICD-10-CM

## 2016-03-24 DIAGNOSIS — I10 Essential (primary) hypertension: Secondary | ICD-10-CM

## 2016-04-05 ENCOUNTER — Other Ambulatory Visit: Payer: Self-pay | Admitting: Family Medicine

## 2016-04-05 DIAGNOSIS — Z1231 Encounter for screening mammogram for malignant neoplasm of breast: Secondary | ICD-10-CM

## 2016-04-19 ENCOUNTER — Ambulatory Visit (HOSPITAL_COMMUNITY): Payer: Medicare Other

## 2016-05-16 ENCOUNTER — Ambulatory Visit: Payer: Medicare Other | Admitting: Family Medicine

## 2016-05-16 ENCOUNTER — Encounter: Payer: Self-pay | Admitting: Family Medicine

## 2016-06-14 ENCOUNTER — Ambulatory Visit (INDEPENDENT_AMBULATORY_CARE_PROVIDER_SITE_OTHER): Payer: Medicare Other | Admitting: Cardiology

## 2016-06-14 ENCOUNTER — Encounter: Payer: Self-pay | Admitting: Cardiology

## 2016-06-14 VITALS — BP 102/60 | HR 70 | Ht 63.0 in | Wt 142.0 lb

## 2016-06-14 DIAGNOSIS — I10 Essential (primary) hypertension: Secondary | ICD-10-CM | POA: Diagnosis not present

## 2016-06-14 DIAGNOSIS — R0789 Other chest pain: Secondary | ICD-10-CM

## 2016-06-14 DIAGNOSIS — E782 Mixed hyperlipidemia: Secondary | ICD-10-CM

## 2016-06-14 DIAGNOSIS — R55 Syncope and collapse: Secondary | ICD-10-CM

## 2016-06-14 MED ORDER — IBUPROFEN 400 MG PO TABS: 400.0000 mg | ORAL_TABLET | Freq: Four times a day (QID) | ORAL | 0 refills | Status: DC | PRN

## 2016-06-14 MED ORDER — NITROGLYCERIN 0.4 MG SL SUBL: 0.4000 mg | SUBLINGUAL_TABLET | SUBLINGUAL | 3 refills | Status: AC | PRN

## 2016-06-14 MED ORDER — NITROGLYCERIN 0.4 MG SL SUBL: 0.4000 mg | SUBLINGUAL_TABLET | SUBLINGUAL | 3 refills | Status: DC | PRN
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
## 2016-06-14 NOTE — Patient Instructions (Signed)
Medication Instructions:  START IBUPROFEN 400 MG - THREE TIMES DAILY FOR NEXT 10 DAYS  I HAVE REFILLED NITRO   Labwork: NONE  Testing/Procedures: NONE  Follow-Up Your physician recommends that you schedule a follow-up appointment in: 2 MONTHS    Any Other Special Instructions Will Be Listed Below (If Applicable).     If you need a refill on your cardiac medications before your next appointment, please call your pharmacy.

## 2016-06-15 DIAGNOSIS — F319 Bipolar disorder, unspecified: Secondary | ICD-10-CM | POA: Diagnosis not present

## 2016-06-19 IMAGING — MR MR SACRUM / SI JOINTS WO CM
4 of 8 series · 22 of 48 positions shown · non-contrast
Comparison: MRI of the lumbar spine dated 08/07/2013 and MRI of the
right hip dated 07/02/2013

CLINICAL DATA: Low back pain and left hip pain. Lumbar
radiculopathy.

EXAM:
MR SACRUM WITHOUT CONTRAST
TECHNIQUE: Multiplanar, multisequence MR imaging was performed. No intravenous
contrast was administered.

[Series 5: t1_tse_tra · axial · 4.0mm · 0.62mm/px · z∈[+33,+220]mm · 7 of 40 slices shown]
[im 1/40]
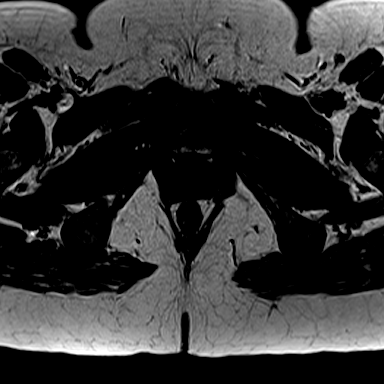
[im 7/40]
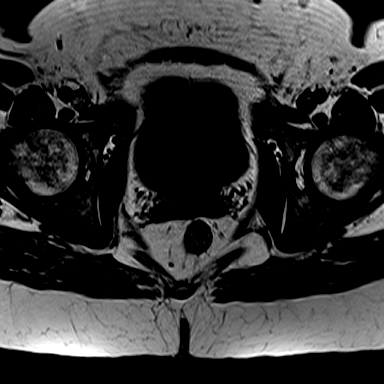
[im 14/40]
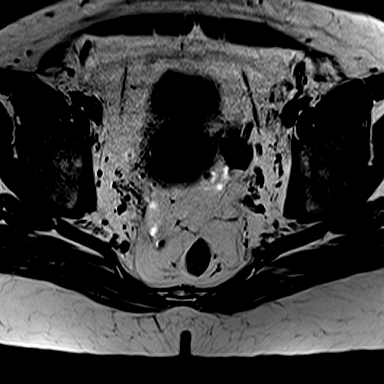
[im 20/40]
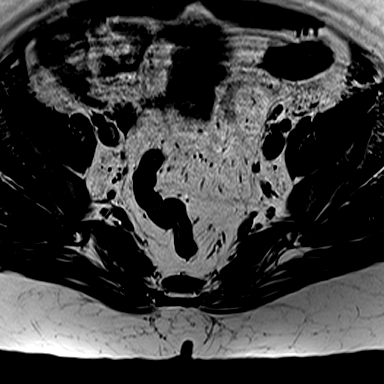
[im 27/40]
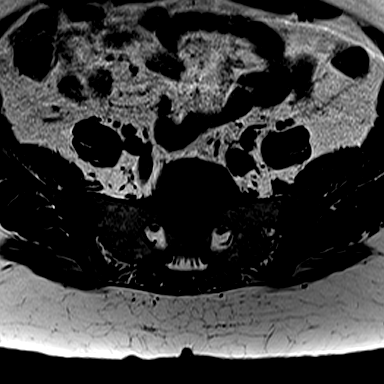
[im 33/40]
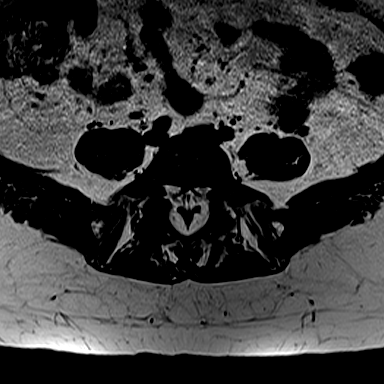
[im 40/40]
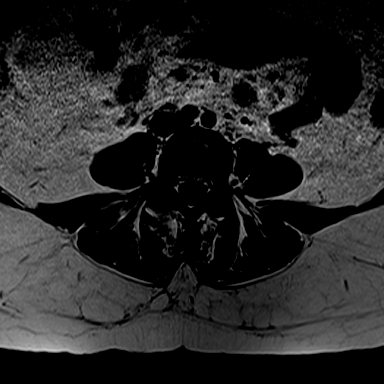

[Series 6: t2fs_tse_tra · axial · 4.0mm · 0.75mm/px · z∈[+33,+220]mm · 7 of 40 slices shown]
[im 1/40]
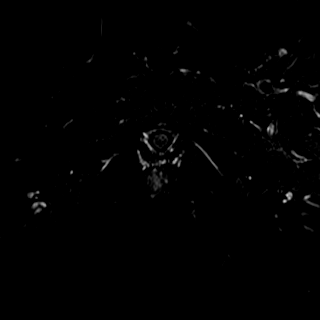
[im 7/40]
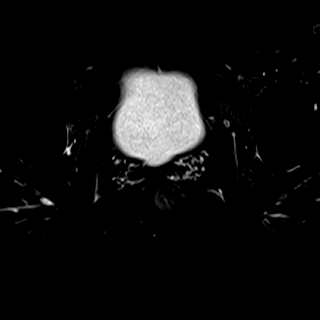
[im 14/40]
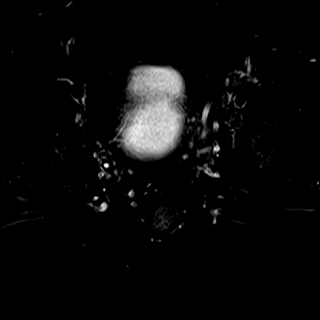
[im 20/40]
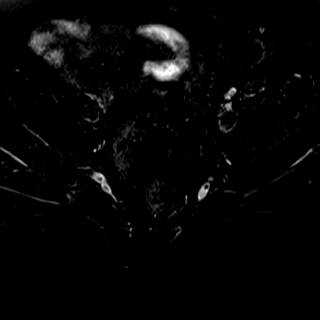
[im 27/40]
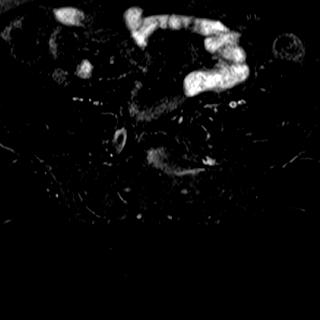
[im 33/40]
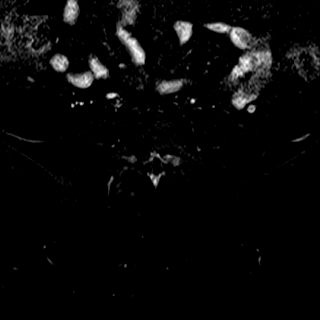
[im 40/40]
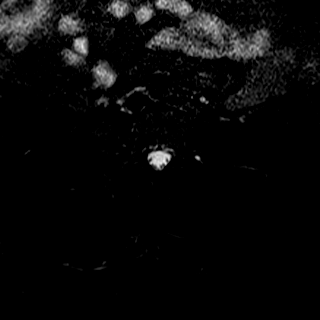

[Series 7: t1_tse_sag · sagittal · 4.0mm · 0.36mm/px · 5 of 40 slices shown]
[im 1/40]
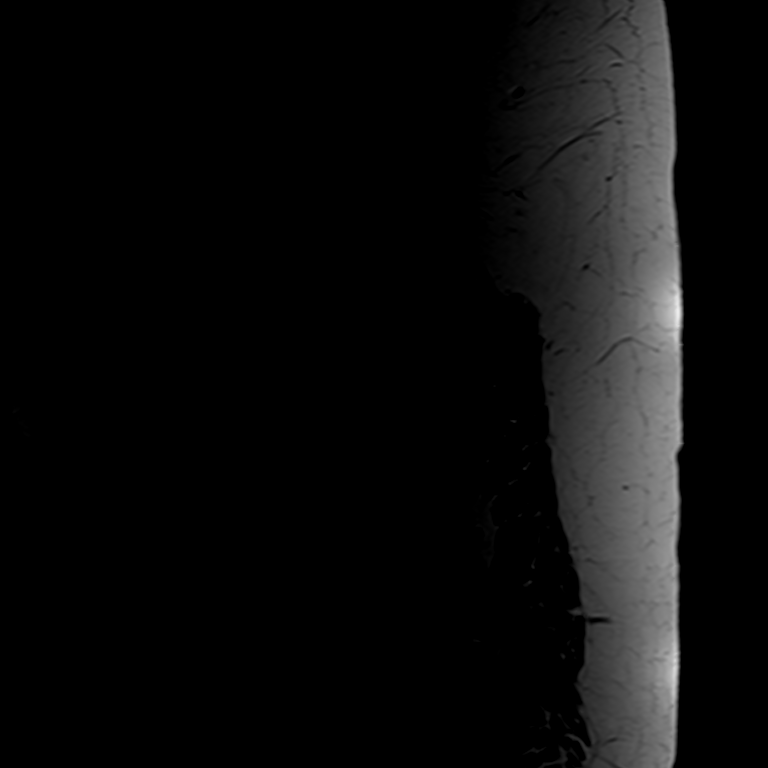
[im 8/40]
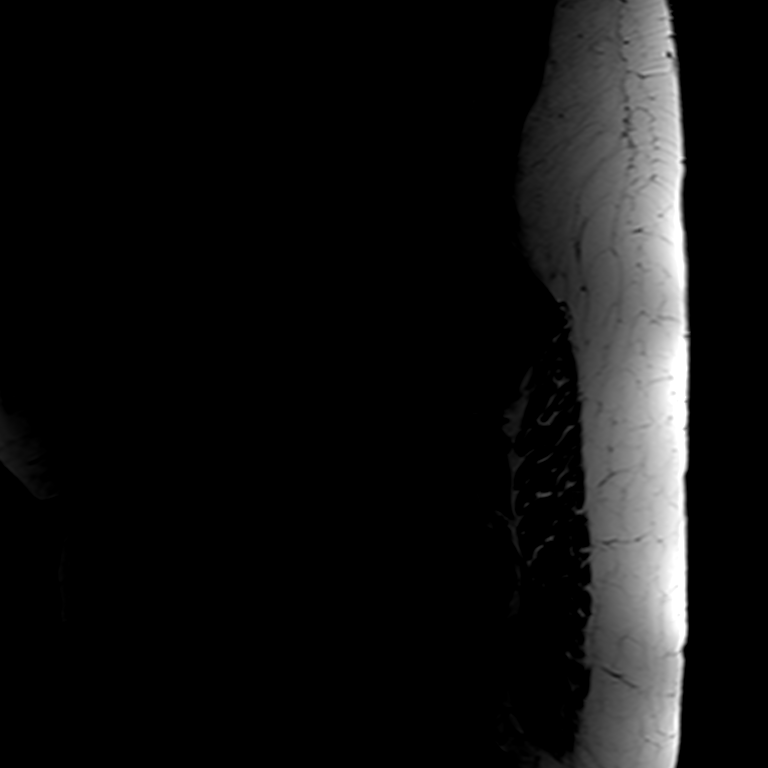
[im 16/40]
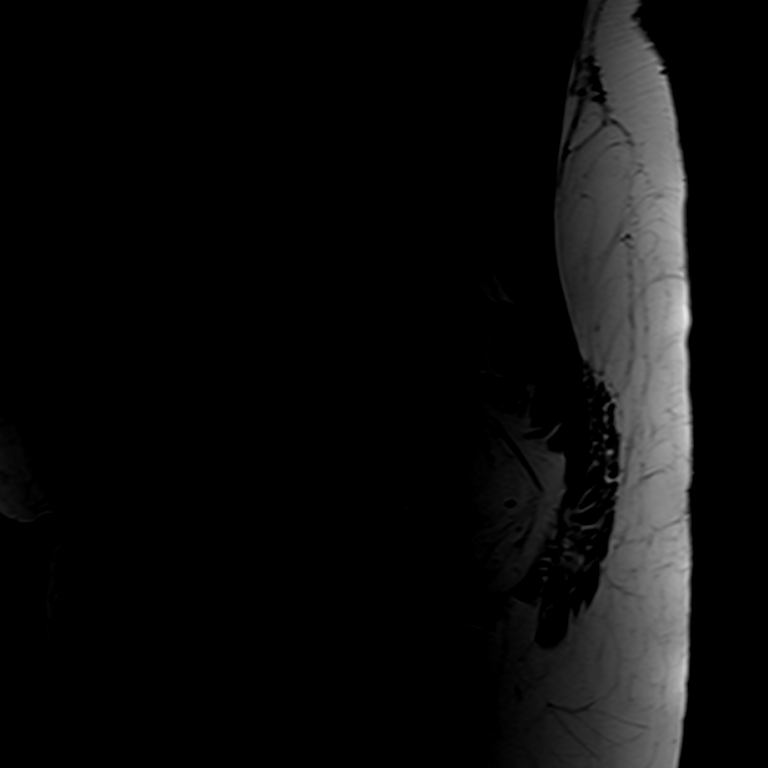
[im 24/40]
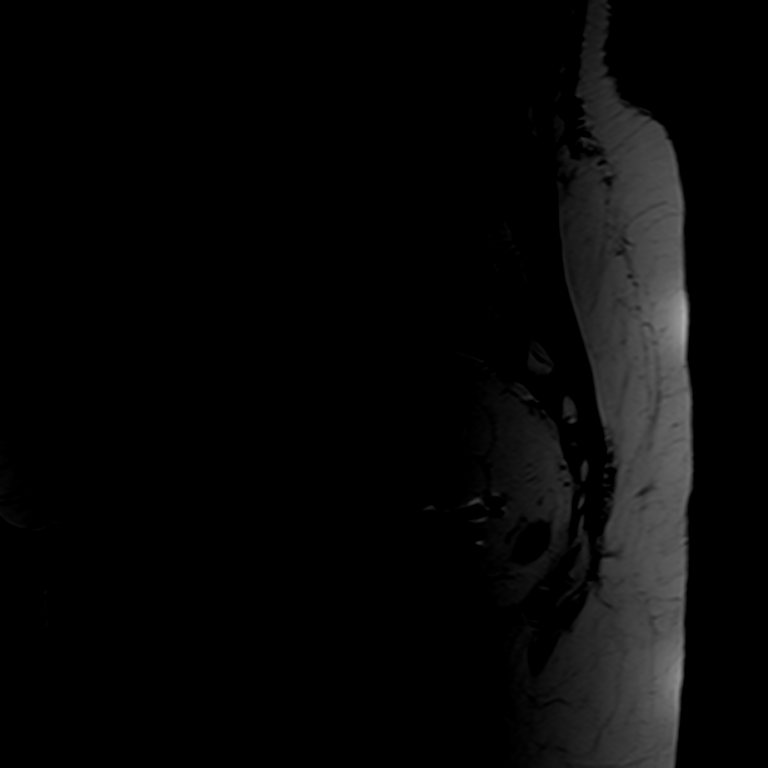
[im 40/40]
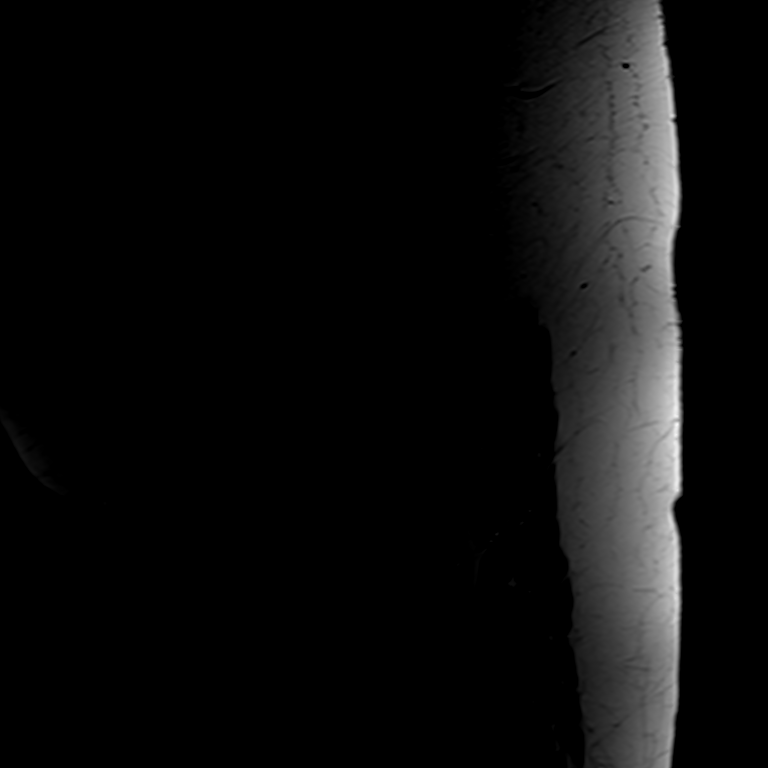

[Series 8: t1_tse_cor · coronal · 4.0mm · 0.73mm/px · 3 of 28 slices shown]
[im 1/28]
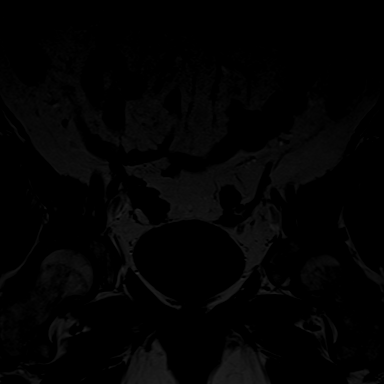
[im 14/28]
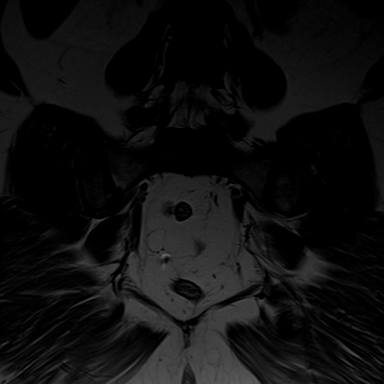
[im 28/28]
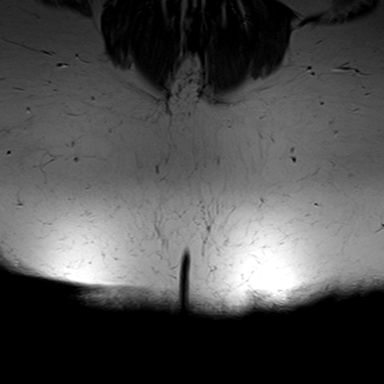

[22 of 48 positions shown; findings below may reference images not displayed]

FINDINGS: There are bilateral pars defects at L5 with a 4 mm spondylolisthesis
L5-S1 with a small broad-based protrusion of the disc extending into
both neural foramina appearing to compress both L5 nerves under the
pedicles. There is prominent edema and degeneration of the vertebral
endplates at L5-S1 to the left of midline at the level of the neural
foramen. The coronal images demonstrate discrete compression of the
left L5 nerve on image 15 series 9.

The sacrum is otherwise normal. Sacroiliac joints are normal. Coccyx
is normal.

Uterus is been removed. Ovaries are normal. Bladder appears normal.
IMPRESSION: Compression of the left L5 nerve in the left neural foramen at L5-S1
due to grade 1 spondylolisthesis which is secondary to bilateral
pars defects at L5.

Broad-based soft disc protrusion at L5-S1 extending into both neural
foramina which could affect the right L5 nerve as well.

## 2016-07-16 DIAGNOSIS — R030 Elevated blood-pressure reading, without diagnosis of hypertension: Secondary | ICD-10-CM | POA: Diagnosis not present

## 2016-07-16 DIAGNOSIS — Q762 Congenital spondylolisthesis: Secondary | ICD-10-CM | POA: Diagnosis not present

## 2016-07-16 DIAGNOSIS — I10 Essential (primary) hypertension: Secondary | ICD-10-CM | POA: Diagnosis not present

## 2016-07-16 DIAGNOSIS — Z6826 Body mass index (BMI) 26.0-26.9, adult: Secondary | ICD-10-CM | POA: Diagnosis not present

## 2016-08-11 ENCOUNTER — Other Ambulatory Visit: Payer: Self-pay | Admitting: Family Medicine

## 2016-08-13 ENCOUNTER — Ambulatory Visit: Payer: Medicare Other | Admitting: Adult Health

## 2016-08-14 ENCOUNTER — Encounter: Payer: Self-pay | Admitting: Adult Health

## 2016-08-20 ENCOUNTER — Other Ambulatory Visit: Payer: Self-pay

## 2016-08-20 MED ORDER — LEVOTHYROXINE SODIUM 25 MCG PO TABS: 25.0000 ug | ORAL_TABLET | Freq: Every day | ORAL | 1 refills | Status: DC

## 2016-08-30 ENCOUNTER — Telehealth: Payer: Self-pay | Admitting: Adult Health

## 2016-08-30 ENCOUNTER — Telehealth: Payer: Self-pay | Admitting: *Deleted

## 2016-08-30 NOTE — Telephone Encounter (Signed)
error 

## 2016-08-30 NOTE — Telephone Encounter (Signed)
Pt has appt on 5/17. BP med not listed and was prescribed by another MD.

## 2016-08-30 NOTE — Telephone Encounter (Signed)
Returned pt call, no answer- no voicemail set up. Will try again later.

## 2016-08-30 NOTE — Telephone Encounter (Signed)
Per phone call--pt states she's been out of her BP meds for 1 month

## 2016-09-06 ENCOUNTER — Encounter: Payer: Self-pay | Admitting: Adult Health

## 2016-09-06 ENCOUNTER — Ambulatory Visit (INDEPENDENT_AMBULATORY_CARE_PROVIDER_SITE_OTHER): Payer: Medicare Other | Admitting: Adult Health

## 2016-09-06 VITALS — BP 116/84 | HR 81 | Ht 63.0 in | Wt 144.0 lb

## 2016-09-06 DIAGNOSIS — E7849 Other hyperlipidemia: Secondary | ICD-10-CM

## 2016-09-06 DIAGNOSIS — I10 Essential (primary) hypertension: Secondary | ICD-10-CM | POA: Diagnosis not present

## 2016-09-06 DIAGNOSIS — E784 Other hyperlipidemia: Secondary | ICD-10-CM

## 2016-09-06 DIAGNOSIS — Z79899 Other long term (current) drug therapy: Secondary | ICD-10-CM

## 2016-09-06 NOTE — Patient Instructions (Signed)
Your physician recommends that you schedule a follow-up appointment as needed.   Your physician recommends that you schedule a follow-up appointment with Primary Doctor.   Your physician recommends that you continue on your current medications as directed. Please refer to the Current Medication list given to you today.  Your physician recommends that you return for lab work in: Fasting   If you need a refill on your cardiac medications before your next appointment, please call your pharmacy.  Thank you for choosing [facility]!

## 2016-09-06 NOTE — Progress Notes (Signed)
Cardiology Office Note   Date:  09/06/2016   ID:  Brooke Maddox, DOB 1971/01/11, MRN [id number]  PCP:  Fayrene Helper, MD  Cardiologist:  [facility] Chief Complaint  Patient presents with  . Chest Pain      History of Present Illness: Brooke Maddox is a 46 y.o. female who presents for ongoing assessment and management of chest pain with multiple CVRF to include Hypertension, tobacco abuse, FH of CAD,and hypercholesterolemia. She was seen by Dr. Harl Bowie on 06/14/2016 She was diagnosed with musculoskeletal pain and treated with Ibuprofen.   She comes today manic but without cardiac complaints. She continues to have musculoskeletal pain and multiple somatic complaints. She is changing PCP to Dr, [name].   Past Medical History:  Diagnosis Date  . Abdominal pain, acute, left lower quadrant   . Acute knee pain    right   . Anxiety associated with depression 2007   hospitalised for mental health problems   . Arthritis   . Bipolar affective disorder ([place])   . Chronic back pain   . Chronic constipation   . Chronic leg pain    left  . Collagen vascular disease ([place])   . COPD (chronic obstructive pulmonary disease) ([place])   . Depression   . Fatigue   . Heart murmur   . History of leukocytosis   . History of seizure disorder   . Hyperlipidemia   . Hypertension   . Hypothyroidism   . Leukocytosis 10/22/2014  . Lumbar radiculopathy    left  . Migraines   . Nausea   . Nicotine addiction   . Obesity   . Osteoarthritis    mild   . Pain management   . PONV (postoperative nausea and vomiting)   . Renal insufficiency   . Seizures ([place])    Dr. Theda Sers     Past Surgical History:  Procedure Laterality Date  . ABDOMINAL HYSTERECTOMY    . LEFT HEART CATHETERIZATION WITH CORONARY ANGIOGRAM N/A 03/17/2013   Procedure: LEFT HEART CATHETERIZATION WITH CORONARY ANGIOGRAM;  Surgeon: Peter M Martinique, MD;  Location: [facility] CATH LAB;  Service: Cardiovascular;  Laterality: N/A;  . POSTERIOR LUMBAR FUSION  2017    Dr. Christella Noa  . VESICOVAGINAL FISTULA CLOSURE W/ TAH  2009     Current Outpatient Prescriptions  Medication Sig Dispense Refill  . azelastine (ASTELIN) 0.1 % nasal spray Place 2 sprays into both nostrils 2 (two) times daily.  12  . beclomethasone (QVAR) 40 MCG/ACT inhaler Inhale 1 puff into the lungs 2 (two) times daily. 1 Inhaler 3  . diazepam (VALIUM) 5 MG tablet Take 1 tablet (5 mg total) by mouth every 6 (six) hours as needed for muscle spasms. 30 tablet 0  . docusate sodium (COLACE) 100 MG capsule Take 1 capsule (100 mg total) by mouth 2 (two) times daily. 30 capsule 0  . FLUoxetine (PROZAC) 40 MG capsule Take 80 mg by mouth daily.  2  . ibuprofen (ADVIL,MOTRIN) 400 MG tablet Take 1 tablet (400 mg total) by mouth every 6 (six) hours as needed. 30 tablet 0  . lamoTRIgine (LAMICTAL) 200 MG tablet Take 200 mg by mouth 2 (two) times daily.     Marland Kitchen levothyroxine (SYNTHROID, LEVOTHROID) 25 MCG tablet Take 1 tablet (25 mcg total) by mouth daily. 90 tablet 1  . nitroGLYCERIN (NITROSTAT) 0.4 MG SL tablet Place 1 tablet (0.4 mg total) under the tongue every 5 (five) minutes as needed for chest pain. 25 tablet 3  .  oxyCODONE (OXY IR/ROXICODONE) 5 MG immediate release tablet Take 1-2 tablets (5-10 mg total) by mouth every 4 (four) hours as needed for moderate pain. 60 tablet 0  . rizatriptan (MAXALT-MLT) 5 MG disintegrating tablet Take 5 mg by mouth as needed for migraine. May repeat in 2 hours if needed    . rosuvastatin (CRESTOR) 20 MG tablet TAKE 1 TABLET BY MOUTH EVERY NIGHT AT BEDTIME 30 tablet 2  . topiramate (TOPAMAX) 25 MG tablet Take 50 mg by mouth daily.     Current Facility-Administered Medications  Medication Dose Route Frequency Provider Last Rate Last Dose  . beclomethasone (QVAR) 40 MCG/ACT inhaler 1 puff  1 puff Inhalation BID Fayrene Helper, MD        Allergies:   Lithium carbonate; Tylenol [acetaminophen]; Wellbutrin [bupropion]; Codeine; Dilaudid [hydromorphone]; Divalproex  sodium; Fentanyl; Iodinated diagnostic agents; Iodine; Iohexol; Morphine and related; Penicillins; and Sulfur    Social History:  The patient  reports that she has been smoking Cigarettes.  She started smoking about 30 years ago. She has a 17.50 pack-year smoking history. She has never used smokeless tobacco. She reports that she does not drink alcohol or use drugs.   Family History:  The patient's family history includes Arthritis in her brother; COPD in her father; Hypertension in her father.    ROS: All other systems are reviewed and negative. Unless otherwise mentioned in H&P    PHYSICAL EXAM: VS:  BP 116/84   Pulse 81   Ht 5\' 3"  (1.6 m)   Wt 144 lb (65.3 kg)   SpO2 96%   BMI 25.51 kg/m  , BMI Body mass index is 25.51 kg/m. GEN: Well nourished, well developed, in no acute distress  HEENT: normal  Neck: no JVD, carotid bruits, or masses Cardiac: RRR; no murmurs, rubs, or gallops,no edema  Respiratory:  clear to auscultation bilaterally, normal work of breathing GI: soft, nontender, nondistended, + BS MS: no deformity or atrophy  Skin: warm and dry, no rash. Multiple tattoo's  Neuro:  Strength and sensation are intact Psych: euthymic mood, full affect, Manic    Recent Labs: 11/08/2015: BUN 7; Potassium 3.4; Sodium 137 11/09/2015: Creatinine, Ser 0.78; Hemoglobin 12.5; Platelets 276    Lipid Panel    Component Value Date/Time   CHOL 145 07/19/2015 1441   TRIG 92 07/19/2015 1441   HDL 50 07/19/2015 1441   CHOLHDL 2.9 07/19/2015 1441   VLDL 18 07/19/2015 1441   LDLCALC 77 07/19/2015 1441      Wt Readings from Last 3 Encounters:  09/06/16 144 lb (65.3 kg)  06/14/16 142 lb (64.4 kg)  01/19/16 153 lb 1.9 oz (69.5 kg)      Other studies Reviewed: 02/27/13 Stress MPI IMPRESSION: 1. Abnormal Lexiscan Cardiolite study. 2. Marked GI uptake obscuring the inferior wall. Significant reverse redistribution. 3. Moderate fixed apical/anteroapical defect with mild  hypokinesis, suggestive of myocardial scar. 4. Low normal LV systolic function, calculated EF 51%. The aforementioned areas were mildly hypokinetic. Remaining wall motion was otherwise normal. 5. Clinical correlation is highly recommended.   02/2013 Cath Procedural Findings:  Hemodynamics:  AO 104/56 mean 79 mm Hg  LV 103/12 mm Hg  Coronary angiography:  Coronary dominance: left  Left mainstem: Normal  Left anterior descending (LAD): Normal  Left circumflex (LCx): large dominant vessel gives rise to 2 OMs, 2 PLOMs, and PDA. It is normal throughout.  Right coronary artery (RCA): Normal.  Left ventriculography: Left ventricular systolic function is normal, LVEF is estimated  at 55-65%, there is no significant mitral regurgitation  Final Conclusions:  1. Normal coronary anatomy.  2. Normal LV function.  02/2013 Echo Study Conclusions  - Left ventricle: The cavity size was normal. Wall thickness was normal. Systolic function was normal. The estimated ejection fraction was in the range of 55% to 60%. Wall motion was normal; there were no regional wall motion abnormalities. Features are consistent with a pseudonormal left ventricular filling pattern, with concomitant abnormal relaxation and increased filling pressure (grade 2 diastolic dysfunction). - Left atrium: The atrium was at the upper limits of normal in size. - Right atrium: Central venous pressure: 1mm Hg (est). - Tricuspid valve: Physiologic regurgitation. - Pulmonary arteries: PA peak pressure: 26mm Hg (S). - Pericardium, extracardiac: There was no pericardial effusion. Impressions:  - No prior study for comparison. Normal LV wall thickness with LVEF 55-60%, probable grade 2 diastolic dysfunction. Upper normal left atrial size. No major valvular abnormalities. PASP 23 mmHg. No pericardial effusion.   ASSESSMENT AND PLAN:  1. Hypertension: She is not longer taking  clonidine as her BP has normalized after having back surgery. She is otherwise doing well from cardiac standpoint. She will be seen prn.   2. Chronic Pain: Followed by PCP.   3, Hypercholesterolemia; Continue statin.    4. Bipolar: Followed by psychiatry.    Current medicines are reviewed at length with the patient today.    Labs/ tests ordered today include:  BMET, CBC Lipids LFT's  TSH to be sent to PCP   Phill Myron. West Pugh, ANP, AACC   09/06/2016 2:37 PM    [facility] [address] [place], [place] [address] Phone: [phone]; Fax: [phone]

## 2016-09-07 DIAGNOSIS — F319 Bipolar disorder, unspecified: Secondary | ICD-10-CM | POA: Diagnosis not present

## 2016-10-29 ENCOUNTER — Other Ambulatory Visit (HOSPITAL_COMMUNITY): Payer: Self-pay | Admitting: Internal Medicine

## 2016-10-29 DIAGNOSIS — Z1231 Encounter for screening mammogram for malignant neoplasm of breast: Secondary | ICD-10-CM

## 2016-11-22 ENCOUNTER — Ambulatory Visit (HOSPITAL_COMMUNITY): Payer: Medicare Other

## 2016-11-30 DIAGNOSIS — F319 Bipolar disorder, unspecified: Secondary | ICD-10-CM | POA: Diagnosis not present

## 2016-12-20 ENCOUNTER — Telehealth: Payer: Self-pay

## 2016-12-20 NOTE — Telephone Encounter (Signed)
Called pt to schedule Medicare Annual Wellness Visit. -nr  

## 2016-12-29 DIAGNOSIS — Z23 Encounter for immunization: Secondary | ICD-10-CM | POA: Diagnosis not present

## 2017-01-08 DIAGNOSIS — Z6827 Body mass index (BMI) 27.0-27.9, adult: Secondary | ICD-10-CM | POA: Diagnosis not present

## 2017-01-08 DIAGNOSIS — Q762 Congenital spondylolisthesis: Secondary | ICD-10-CM | POA: Diagnosis not present

## 2017-01-08 DIAGNOSIS — I10 Essential (primary) hypertension: Secondary | ICD-10-CM | POA: Diagnosis not present

## 2017-01-12 ENCOUNTER — Emergency Department (HOSPITAL_COMMUNITY)
Admission: EM | Admit: 2017-01-12 | Discharge: 2017-01-13 | Disposition: A | Discharge status: Home / Self Care | Payer: Medicare Other | Attending: Emergency Medicine | Admitting: Emergency Medicine

## 2017-01-12 ENCOUNTER — Encounter (HOSPITAL_COMMUNITY): Payer: Self-pay | Admitting: *Deleted

## 2017-01-12 DIAGNOSIS — R112 Nausea with vomiting, unspecified: Secondary | ICD-10-CM | POA: Diagnosis not present

## 2017-01-12 DIAGNOSIS — R197 Diarrhea, unspecified: Secondary | ICD-10-CM | POA: Diagnosis not present

## 2017-01-12 LAB — COMPREHENSIVE METABOLIC PANEL
ALT: 38 U/L (ref 14–54)
AST: 27 U/L (ref 15–41)
Albumin: 4.1 g/dL (ref 3.5–5.0)
Alkaline Phosphatase: 109 U/L (ref 38–126)
Anion gap: 9 (ref 5–15)
BUN: 10 mg/dL (ref 6–20)
CO2: 27 mmol/L (ref 22–32)
Calcium: 10 mg/dL (ref 8.9–10.3)
Chloride: 103 mmol/L (ref 101–111)
Creatinine, Ser: 0.71 mg/dL (ref 0.44–1.00)
GFR calc Af Amer: >60 mL/min (ref >60)
GFR calc non Af Amer: >60 mL/min (ref >60)
Glucose, Bld: 99 mg/dL (ref 65–99)
Potassium: 3.5 mmol/L (ref 3.5–5.1)
Sodium: 139 mmol/L (ref 135–145)
Total Bilirubin: 0.4 mg/dL (ref 0.3–1.2)
Total Protein: 7.4 g/dL (ref 6.5–8.1)

## 2017-01-12 LAB — CBC
HCT: 42.4 % (ref 36.0–46.0)
Hemoglobin: 14.5 g/dL (ref 12.0–15.0)
MCH: 32.2 pg (ref 26.0–34.0)
MCHC: 34.2 g/dL (ref 30.0–36.0)
MCV: 94 fL (ref 78.0–100.0)
Platelets: 382 10*3/uL (ref 150–400)
RBC: 4.51 MIL/uL (ref 3.87–5.11)
RDW: 12.3 % (ref 11.5–15.5)
WBC: 10.7 10*3/uL — ABNORMAL HIGH (ref 4.0–10.5)

## 2017-01-12 LAB — LIPASE, BLOOD: Lipase: 22 U/L (ref 11–51)

## 2017-01-12 MED ORDER — DIPHENHYDRAMINE HCL 50 MG/ML IJ SOLN
25.0000 mg | Freq: Once | INTRAMUSCULAR | Status: AC
  Administered 2017-01-12: 25 mg via INTRAVENOUS
  Filled 2017-01-12: qty 1

## 2017-01-12 MED ORDER — PROMETHAZINE HCL 25 MG RE SUPP: 25.0000 mg | Freq: Three times a day (TID) | RECTAL | 0 refills | Status: DC | PRN

## 2017-01-12 MED ORDER — LACTATED RINGERS IV BOLUS (SEPSIS)
1000.0000 mL | Freq: Once | INTRAVENOUS | Status: AC
  Administered 2017-01-12: 1000 mL via INTRAVENOUS

## 2017-01-12 MED ORDER — METOCLOPRAMIDE HCL 5 MG/ML IJ SOLN
10.0000 mg | Freq: Once | INTRAMUSCULAR | Status: AC
  Administered 2017-01-12: 10 mg via INTRAVENOUS
  Filled 2017-01-12: qty 2
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
## 2017-01-12 NOTE — ED Notes (Signed)
Pt tolerated PO challenge- no vomiting or nausea reported.

## 2017-01-12 NOTE — ED Triage Notes (Signed)
Nausea and vomiting, diarrhea for 2 days

## 2017-01-14 DIAGNOSIS — G43909 Migraine, unspecified, not intractable, without status migrainosus: Secondary | ICD-10-CM | POA: Diagnosis not present

## 2017-01-14 DIAGNOSIS — I10 Essential (primary) hypertension: Secondary | ICD-10-CM | POA: Diagnosis not present

## 2017-01-14 DIAGNOSIS — Z6826 Body mass index (BMI) 26.0-26.9, adult: Secondary | ICD-10-CM | POA: Diagnosis not present

## 2017-01-14 DIAGNOSIS — F419 Anxiety disorder, unspecified: Secondary | ICD-10-CM | POA: Diagnosis not present

## 2017-01-19 ENCOUNTER — Encounter (HOSPITAL_COMMUNITY): Payer: Self-pay | Admitting: Emergency Medicine

## 2017-01-19 ENCOUNTER — Emergency Department (HOSPITAL_COMMUNITY)
Admission: EM | Admit: 2017-01-19 | Discharge: 2017-01-19 | Disposition: A | Discharge status: Home / Self Care | Payer: Medicare Other | Attending: Emergency Medicine | Admitting: Emergency Medicine

## 2017-01-19 ENCOUNTER — Emergency Department (HOSPITAL_COMMUNITY): Payer: Medicare Other

## 2017-01-19 DIAGNOSIS — E039 Hypothyroidism, unspecified: Secondary | ICD-10-CM | POA: Insufficient documentation

## 2017-01-19 DIAGNOSIS — R1030 Lower abdominal pain, unspecified: Secondary | ICD-10-CM | POA: Diagnosis not present

## 2017-01-19 DIAGNOSIS — I10 Essential (primary) hypertension: Secondary | ICD-10-CM | POA: Insufficient documentation

## 2017-01-19 DIAGNOSIS — J449 Chronic obstructive pulmonary disease, unspecified: Secondary | ICD-10-CM | POA: Diagnosis not present

## 2017-01-19 DIAGNOSIS — R109 Unspecified abdominal pain: Secondary | ICD-10-CM | POA: Diagnosis not present

## 2017-01-19 DIAGNOSIS — F17210 Nicotine dependence, cigarettes, uncomplicated: Secondary | ICD-10-CM | POA: Insufficient documentation

## 2017-01-19 DIAGNOSIS — Z79899 Other long term (current) drug therapy: Secondary | ICD-10-CM | POA: Insufficient documentation

## 2017-01-19 LAB — CBC WITH DIFFERENTIAL/PLATELET
Basophils Absolute: 0.1 10*3/uL (ref 0.0–0.1)
Basophils Relative: 1 %
Eosinophils Absolute: 0.7 10*3/uL (ref 0.0–0.7)
Eosinophils Relative: 7 %
HCT: 39.9 % (ref 36.0–46.0)
Hemoglobin: 13.5 g/dL (ref 12.0–15.0)
Lymphocytes Relative: 34 %
Lymphs Abs: 3.5 10*3/uL (ref 0.7–4.0)
MCH: 31.9 pg (ref 26.0–34.0)
MCHC: 33.8 g/dL (ref 30.0–36.0)
MCV: 94.3 fL (ref 78.0–100.0)
Monocytes Absolute: 0.7 10*3/uL (ref 0.1–1.0)
Monocytes Relative: 7 %
Neutro Abs: 5.2 10*3/uL (ref 1.7–7.7)
Neutrophils Relative %: 51 %
Platelets: 338 10*3/uL (ref 150–400)
RBC: 4.23 MIL/uL (ref 3.87–5.11)
RDW: 12.4 % (ref 11.5–15.5)
WBC: 10.1 10*3/uL (ref 4.0–10.5)

## 2017-01-19 LAB — URINALYSIS, ROUTINE W REFLEX MICROSCOPIC
Bilirubin Urine: NEGATIVE
Glucose, UA: NEGATIVE mg/dL
Ketones, ur: NEGATIVE mg/dL
Leukocytes, UA: NEGATIVE
Nitrite: NEGATIVE
Protein, ur: NEGATIVE mg/dL
Specific Gravity, Urine: 1.017 (ref 1.005–1.030)
Squamous Epithelial / LPF: NONE SEEN
pH: 5 (ref 5.0–8.0)

## 2017-01-19 LAB — COMPREHENSIVE METABOLIC PANEL
ALT: 29 U/L (ref 14–54)
AST: 29 U/L (ref 15–41)
Albumin: 3.8 g/dL (ref 3.5–5.0)
Alkaline Phosphatase: 122 U/L (ref 38–126)
Anion gap: 10 (ref 5–15)
BUN: 7 mg/dL (ref 6–20)
CO2: 26 mmol/L (ref 22–32)
Calcium: 9.7 mg/dL (ref 8.9–10.3)
Chloride: 102 mmol/L (ref 101–111)
Creatinine, Ser: 0.71 mg/dL (ref 0.44–1.00)
GFR calc Af Amer: >60 mL/min (ref >60)
GFR calc non Af Amer: >60 mL/min (ref >60)
Glucose, Bld: 87 mg/dL (ref 65–99)
Potassium: 4.2 mmol/L (ref 3.5–5.1)
Sodium: 138 mmol/L (ref 135–145)
Total Bilirubin: 0.2 mg/dL — ABNORMAL LOW (ref 0.3–1.2)
Total Protein: 7 g/dL (ref 6.5–8.1)

## 2017-01-19 LAB — I-STAT CG4 LACTIC ACID, ED
Lactic Acid, Venous: 2.13 mmol/L (ref 0.5–1.9)
Lactic Acid, Venous: 1.02 mmol/L (ref 0.5–1.9)

## 2017-01-19 LAB — LIPASE, BLOOD: Lipase: 29 U/L (ref 11–51)

## 2017-01-19 LAB — PREGNANCY, URINE: Preg Test, Ur: NEGATIVE

## 2017-01-19 MED ORDER — DICYCLOMINE HCL 10 MG PO CAPS
20.0000 mg | ORAL_CAPSULE | Freq: Once | ORAL | Status: AC
  Administered 2017-01-19: 20 mg via ORAL
  Filled 2017-01-19: qty 2

## 2017-01-19 MED ORDER — FENTANYL CITRATE (PF) 100 MCG/2ML IJ SOLN
50.0000 ug | Freq: Once | INTRAMUSCULAR | Status: AC
  Administered 2017-01-19: 50 ug via INTRAVENOUS
  Filled 2017-01-19: qty 2

## 2017-01-19 MED ORDER — KETOROLAC TROMETHAMINE 30 MG/ML IJ SOLN
30.0000 mg | Freq: Once | INTRAMUSCULAR | Status: AC
  Administered 2017-01-19: 30 mg via INTRAVENOUS
  Filled 2017-01-19: qty 1

## 2017-01-19 MED ORDER — DICYCLOMINE HCL 20 MG PO TABS: 20.0000 mg | ORAL_TABLET | Freq: Two times a day (BID) | ORAL | 0 refills | Status: DC

## 2017-01-19 NOTE — ED Triage Notes (Signed)
Onset 1 am sharp, stabbing pain in lower abdomen, denies vomiting or diarrhea

## 2017-01-19 NOTE — Discharge Instructions (Signed)
Dicyclomine every 6 hours as needed  Please obtain all of your results from medical records or have your doctors office obtain the results - share them with your doctor - you should be seen at your doctors office in the next 2 days. Call today to arrange your follow up. Take the medications as prescribed. Please review all of the medicines and only take them if you do not have an allergy to them. Please be aware that if you are taking birth control pills, taking other prescriptions, ESPECIALLY ANTIBIOTICS may make the birth control ineffective - if this is the case, either do not engage in sexual activity or use alternative methods of birth control such as condoms until you have finished the medicine and your family doctor says it is OK to restart them. If you are on a blood thinner such as COUMADIN, be aware that any other medicine that you take may cause the coumadin to either work too much, or not enough - you should have your coumadin level rechecked in next 7 days if this is the case.  ?  It is also a possibility that you have an allergic reaction to any of the medicines that you have been prescribed - Everybody reacts differently to medications and while MOST people have no trouble with most medicines, you may have a reaction such as nausea, vomiting, rash, swelling, shortness of breath. If this is the case, please stop taking the medicine immediately and contact your physician.  ?  You should return to the ER if you develop severe or worsening symptoms.
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
## 2017-01-19 NOTE — ED Notes (Signed)
Patient to CT.

## 2017-01-28 DIAGNOSIS — Z124 Encounter for screening for malignant neoplasm of cervix: Secondary | ICD-10-CM | POA: Diagnosis not present

## 2017-01-28 DIAGNOSIS — Z6827 Body mass index (BMI) 27.0-27.9, adult: Secondary | ICD-10-CM | POA: Diagnosis not present

## 2017-01-28 DIAGNOSIS — Z1239 Encounter for other screening for malignant neoplasm of breast: Secondary | ICD-10-CM | POA: Diagnosis not present

## 2017-01-28 DIAGNOSIS — F419 Anxiety disorder, unspecified: Secondary | ICD-10-CM | POA: Diagnosis not present

## 2017-01-28 DIAGNOSIS — M549 Dorsalgia, unspecified: Secondary | ICD-10-CM | POA: Diagnosis not present

## 2017-01-28 DIAGNOSIS — Z79899 Other long term (current) drug therapy: Secondary | ICD-10-CM | POA: Diagnosis not present

## 2017-01-28 DIAGNOSIS — I10 Essential (primary) hypertension: Secondary | ICD-10-CM | POA: Diagnosis not present

## 2017-01-28 DIAGNOSIS — F349 Persistent mood [affective] disorder, unspecified: Secondary | ICD-10-CM | POA: Diagnosis not present

## 2017-01-28 DIAGNOSIS — G43009 Migraine without aura, not intractable, without status migrainosus: Secondary | ICD-10-CM | POA: Diagnosis not present

## 2017-01-28 DIAGNOSIS — R140 Abdominal distension (gaseous): Secondary | ICD-10-CM | POA: Diagnosis not present

## 2017-01-28 DIAGNOSIS — G8929 Other chronic pain: Secondary | ICD-10-CM | POA: Diagnosis not present

## 2017-01-28 DIAGNOSIS — F17210 Nicotine dependence, cigarettes, uncomplicated: Secondary | ICD-10-CM | POA: Diagnosis not present

## 2017-01-28 DIAGNOSIS — E039 Hypothyroidism, unspecified: Secondary | ICD-10-CM | POA: Diagnosis not present

## 2017-02-12 DIAGNOSIS — E039 Hypothyroidism, unspecified: Secondary | ICD-10-CM | POA: Diagnosis not present

## 2017-02-12 DIAGNOSIS — I10 Essential (primary) hypertension: Secondary | ICD-10-CM | POA: Diagnosis not present

## 2017-02-14 DIAGNOSIS — E785 Hyperlipidemia, unspecified: Secondary | ICD-10-CM | POA: Diagnosis not present

## 2017-02-14 DIAGNOSIS — Z6826 Body mass index (BMI) 26.0-26.9, adult: Secondary | ICD-10-CM | POA: Diagnosis not present

## 2017-02-14 DIAGNOSIS — E039 Hypothyroidism, unspecified: Secondary | ICD-10-CM | POA: Diagnosis not present

## 2017-02-14 DIAGNOSIS — F419 Anxiety disorder, unspecified: Secondary | ICD-10-CM | POA: Diagnosis not present

## 2017-02-14 DIAGNOSIS — R7301 Impaired fasting glucose: Secondary | ICD-10-CM | POA: Diagnosis not present

## 2017-02-14 DIAGNOSIS — F319 Bipolar disorder, unspecified: Secondary | ICD-10-CM | POA: Diagnosis not present

## 2017-02-16 ENCOUNTER — Emergency Department (HOSPITAL_COMMUNITY)
Admission: EM | Admit: 2017-02-16 | Discharge: 2017-02-16 | Disposition: A | Discharge status: Home / Self Care | Payer: Medicare Other | Attending: Emergency Medicine | Admitting: Emergency Medicine

## 2017-02-16 ENCOUNTER — Emergency Department (HOSPITAL_COMMUNITY): Payer: Medicare Other

## 2017-02-16 ENCOUNTER — Encounter (HOSPITAL_COMMUNITY): Payer: Self-pay

## 2017-02-16 DIAGNOSIS — Z79899 Other long term (current) drug therapy: Secondary | ICD-10-CM | POA: Insufficient documentation

## 2017-02-16 DIAGNOSIS — W010XXA Fall on same level from slipping, tripping and stumbling without subsequent striking against object, initial encounter: Secondary | ICD-10-CM | POA: Insufficient documentation

## 2017-02-16 DIAGNOSIS — Y929 Unspecified place or not applicable: Secondary | ICD-10-CM | POA: Insufficient documentation

## 2017-02-16 DIAGNOSIS — I10 Essential (primary) hypertension: Secondary | ICD-10-CM | POA: Insufficient documentation

## 2017-02-16 DIAGNOSIS — Z9181 History of falling: Secondary | ICD-10-CM | POA: Diagnosis not present

## 2017-02-16 DIAGNOSIS — S39012A Strain of muscle, fascia and tendon of lower back, initial encounter: Secondary | ICD-10-CM | POA: Insufficient documentation

## 2017-02-16 DIAGNOSIS — M545 Low back pain: Secondary | ICD-10-CM | POA: Diagnosis not present

## 2017-02-16 DIAGNOSIS — Y939 Activity, unspecified: Secondary | ICD-10-CM | POA: Diagnosis not present

## 2017-02-16 DIAGNOSIS — Y999 Unspecified external cause status: Secondary | ICD-10-CM | POA: Insufficient documentation

## 2017-02-16 DIAGNOSIS — G8929 Other chronic pain: Secondary | ICD-10-CM | POA: Diagnosis not present

## 2017-02-16 DIAGNOSIS — J449 Chronic obstructive pulmonary disease, unspecified: Secondary | ICD-10-CM | POA: Insufficient documentation

## 2017-02-16 DIAGNOSIS — F17210 Nicotine dependence, cigarettes, uncomplicated: Secondary | ICD-10-CM | POA: Insufficient documentation

## 2017-02-16 DIAGNOSIS — S3992XA Unspecified injury of lower back, initial encounter: Secondary | ICD-10-CM | POA: Diagnosis present

## 2017-02-16 DIAGNOSIS — E039 Hypothyroidism, unspecified: Secondary | ICD-10-CM | POA: Insufficient documentation

## 2017-02-16 MED ORDER — DEXAMETHASONE SODIUM PHOSPHATE 10 MG/ML IJ SOLN: 10.0000 mg | Freq: Once | INTRAMUSCULAR | Status: DC

## 2017-02-16 MED ORDER — METHOCARBAMOL 500 MG PO TABS: 500.0000 mg | ORAL_TABLET | Freq: Four times a day (QID) | ORAL | 0 refills | Status: AC

## 2017-02-16 MED ORDER — HYDROCODONE-ACETAMINOPHEN 5-325 MG PO TABS: 1.0000 | ORAL_TABLET | ORAL | 0 refills | Status: DC | PRN

## 2017-02-16 MED ORDER — HYDROCODONE-ACETAMINOPHEN 5-325 MG PO TABS
1.0000 | ORAL_TABLET | Freq: Once | ORAL | Status: AC
  Administered 2017-02-16: 1 via ORAL
  Filled 2017-02-16: qty 1

## 2017-02-16 MED ORDER — METHOCARBAMOL 500 MG PO TABS
500.0000 mg | ORAL_TABLET | Freq: Once | ORAL | Status: AC
  Administered 2017-02-16: 500 mg via ORAL
  Filled 2017-02-16: qty 1

## 2017-02-16 NOTE — ED Triage Notes (Signed)
Pt report fall yesterday and hurt lower back  She reports that she took her last Oxycontin yesterday   Has back pain "and I just need a pain shot"  Dr Princess Bruins did back surgery

## 2017-02-16 NOTE — ED Triage Notes (Signed)
Pt reports she slipped and fell last night. Pain lower back. Pt reports she took her last oxycontin yesterday and all she needs is a pain shot

## 2017-02-16 NOTE — Discharge Instructions (Signed)
Take your next dose of prednisone tomorrow morning.  Use the the other medicines as directed.  Do not drive within 4 hours of taking hydrocodone as this will make you drowsy.  Avoid lifting,  Bending,  Twisting or any other activity that worsens your pain over the next week.  Apply a heating pad to your back 20 minutes 3-4 times daily.  You should get rechecked if your symptoms are not better over the next 5 days,  Or you develop increased pain,  Weakness in your leg(s) or loss of bladder or bowel function - these can be symptoms of a worsening condition.
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
## 2017-02-22 DIAGNOSIS — F319 Bipolar disorder, unspecified: Secondary | ICD-10-CM | POA: Diagnosis not present

## 2017-03-11 DIAGNOSIS — Z124 Encounter for screening for malignant neoplasm of cervix: Secondary | ICD-10-CM | POA: Diagnosis not present

## 2017-03-11 DIAGNOSIS — H60509 Unspecified acute noninfective otitis externa, unspecified ear: Secondary | ICD-10-CM | POA: Diagnosis not present

## 2017-03-11 DIAGNOSIS — Z01419 Encounter for gynecological examination (general) (routine) without abnormal findings: Secondary | ICD-10-CM | POA: Diagnosis not present

## 2017-04-25 ENCOUNTER — Ambulatory Visit (INDEPENDENT_AMBULATORY_CARE_PROVIDER_SITE_OTHER): Payer: Medicare Other | Admitting: Otolaryngology

## 2017-04-25 DIAGNOSIS — Q762 Congenital spondylolisthesis: Secondary | ICD-10-CM | POA: Diagnosis not present

## 2017-04-26 DIAGNOSIS — Z6827 Body mass index (BMI) 27.0-27.9, adult: Secondary | ICD-10-CM | POA: Diagnosis not present

## 2017-04-26 DIAGNOSIS — G8929 Other chronic pain: Secondary | ICD-10-CM | POA: Diagnosis not present

## 2017-04-26 DIAGNOSIS — F339 Major depressive disorder, recurrent, unspecified: Secondary | ICD-10-CM | POA: Diagnosis not present

## 2017-04-26 DIAGNOSIS — H60509 Unspecified acute noninfective otitis externa, unspecified ear: Secondary | ICD-10-CM | POA: Diagnosis not present

## 2017-04-26 DIAGNOSIS — M545 Low back pain: Secondary | ICD-10-CM | POA: Diagnosis not present

## 2017-04-26 DIAGNOSIS — F419 Anxiety disorder, unspecified: Secondary | ICD-10-CM | POA: Diagnosis not present

## 2017-04-29 ENCOUNTER — Other Ambulatory Visit (HOSPITAL_COMMUNITY): Payer: Self-pay | Admitting: Neurosurgery

## 2017-04-29 DIAGNOSIS — Q762 Congenital spondylolisthesis: Secondary | ICD-10-CM

## 2017-04-30 ENCOUNTER — Encounter (HOSPITAL_COMMUNITY): Payer: Self-pay | Admitting: *Deleted

## 2017-04-30 DIAGNOSIS — F17210 Nicotine dependence, cigarettes, uncomplicated: Secondary | ICD-10-CM | POA: Insufficient documentation

## 2017-04-30 DIAGNOSIS — R200 Anesthesia of skin: Secondary | ICD-10-CM | POA: Diagnosis not present

## 2017-04-30 DIAGNOSIS — J449 Chronic obstructive pulmonary disease, unspecified: Secondary | ICD-10-CM | POA: Insufficient documentation

## 2017-04-30 DIAGNOSIS — M5442 Lumbago with sciatica, left side: Secondary | ICD-10-CM | POA: Diagnosis not present

## 2017-04-30 DIAGNOSIS — E039 Hypothyroidism, unspecified: Secondary | ICD-10-CM | POA: Diagnosis not present

## 2017-04-30 DIAGNOSIS — M5440 Lumbago with sciatica, unspecified side: Secondary | ICD-10-CM | POA: Insufficient documentation

## 2017-04-30 DIAGNOSIS — E785 Hyperlipidemia, unspecified: Secondary | ICD-10-CM | POA: Diagnosis not present

## 2017-04-30 DIAGNOSIS — M545 Low back pain: Secondary | ICD-10-CM | POA: Diagnosis not present

## 2017-04-30 DIAGNOSIS — Z79899 Other long term (current) drug therapy: Secondary | ICD-10-CM | POA: Insufficient documentation

## 2017-04-30 DIAGNOSIS — I10 Essential (primary) hypertension: Secondary | ICD-10-CM | POA: Insufficient documentation

## 2017-04-30 DIAGNOSIS — G8929 Other chronic pain: Secondary | ICD-10-CM | POA: Diagnosis not present

## 2017-04-30 DIAGNOSIS — M5441 Lumbago with sciatica, right side: Secondary | ICD-10-CM | POA: Diagnosis not present

## 2017-04-30 NOTE — ED Triage Notes (Signed)
Pt with lower back, pt states that she has a plate and screws in her back, MRI scheduled for Friday.  Pt states with movement it feels like something is moving.  Pt with recent falls.  Bilateral leg tingling per pt.

## 2017-05-01 ENCOUNTER — Other Ambulatory Visit: Payer: Self-pay

## 2017-05-01 ENCOUNTER — Emergency Department (HOSPITAL_COMMUNITY)
Admission: EM | Admit: 2017-05-01 | Discharge: 2017-05-01 | Disposition: A | Discharge status: Home / Self Care | Payer: Medicare Other | Attending: Emergency Medicine | Admitting: Emergency Medicine

## 2017-05-01 ENCOUNTER — Emergency Department (HOSPITAL_COMMUNITY): Payer: Medicare Other

## 2017-05-01 DIAGNOSIS — M545 Low back pain: Secondary | ICD-10-CM

## 2017-05-01 DIAGNOSIS — M5440 Lumbago with sciatica, unspecified side: Secondary | ICD-10-CM | POA: Diagnosis not present

## 2017-05-01 DIAGNOSIS — G8929 Other chronic pain: Secondary | ICD-10-CM

## 2017-05-01 MED ORDER — GABAPENTIN 300 MG PO CAPS: 300.0000 mg | ORAL_CAPSULE | Freq: Two times a day (BID) | ORAL | 0 refills | Status: DC

## 2017-05-01 MED ORDER — OXYCODONE-ACETAMINOPHEN 5-325 MG PO TABS
2.0000 | ORAL_TABLET | Freq: Once | ORAL | Status: AC
  Administered 2017-05-01: 2 via ORAL
  Filled 2017-05-01: qty 2

## 2017-05-01 MED ORDER — GABAPENTIN 300 MG PO CAPS
300.0000 mg | ORAL_CAPSULE | Freq: Once | ORAL | Status: AC
  Administered 2017-05-01: 300 mg via ORAL
  Filled 2017-05-01: qty 1

## 2017-05-01 MED ORDER — OXYCODONE-ACETAMINOPHEN 5-325 MG PO TABS
1.0000 | ORAL_TABLET | Freq: Once | ORAL | Status: AC
  Administered 2017-05-01: 1 via ORAL
  Filled 2017-05-01: qty 1

## 2017-05-01 MED ORDER — DEXAMETHASONE SODIUM PHOSPHATE 4 MG/ML IJ SOLN
10.0000 mg | Freq: Once | INTRAMUSCULAR | Status: AC
  Administered 2017-05-01: 10 mg via INTRAMUSCULAR
  Filled 2017-05-01: qty 3

## 2017-05-01 MED ORDER — TRAMADOL HCL 50 MG PO TABS: 50.0000 mg | ORAL_TABLET | Freq: Four times a day (QID) | ORAL | 0 refills | Status: DC | PRN
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
## 2017-05-01 NOTE — ED Notes (Signed)
Pt ambulated in hallway. Pt states she is walking better than before. States her "legs don't feel numb anymore" Pt states she uses a walker at home. Pt used side rails to ambulate down hallway

## 2017-05-03 ENCOUNTER — Ambulatory Visit (HOSPITAL_COMMUNITY): Payer: Medicare Other

## 2017-05-10 ENCOUNTER — Ambulatory Visit (HOSPITAL_COMMUNITY): Payer: Medicare Other

## 2017-05-16 ENCOUNTER — Other Ambulatory Visit (HOSPITAL_COMMUNITY): Payer: Self-pay | Admitting: Internal Medicine

## 2017-05-16 DIAGNOSIS — Z1231 Encounter for screening mammogram for malignant neoplasm of breast: Secondary | ICD-10-CM

## 2017-05-23 ENCOUNTER — Ambulatory Visit (HOSPITAL_COMMUNITY)
Admission: RE | Admit: 2017-05-23 | Discharge: 2017-05-23 | Disposition: A | Discharge status: Home / Self Care | Payer: Medicare Other | Source: Ambulatory Visit | Attending: Internal Medicine | Admitting: Internal Medicine

## 2017-05-23 ENCOUNTER — Ambulatory Visit (HOSPITAL_COMMUNITY)
Admission: RE | Admit: 2017-05-23 | Discharge: 2017-05-23 | Disposition: A | Discharge status: Home / Self Care | Payer: Medicare Other | Source: Ambulatory Visit | Attending: Neurosurgery | Admitting: Neurosurgery

## 2017-05-23 DIAGNOSIS — M4726 Other spondylosis with radiculopathy, lumbar region: Secondary | ICD-10-CM | POA: Diagnosis not present

## 2017-05-23 DIAGNOSIS — Q762 Congenital spondylolisthesis: Secondary | ICD-10-CM

## 2017-05-23 DIAGNOSIS — Z1231 Encounter for screening mammogram for malignant neoplasm of breast: Secondary | ICD-10-CM | POA: Insufficient documentation

## 2017-05-23 LAB — POCT I-STAT CREATININE: Creatinine, Ser: 0.9 mg/dL (ref 0.44–1.00)

## 2017-05-23 MED ORDER — GADOBENATE DIMEGLUMINE 529 MG/ML IV SOLN
15.0000 mL | Freq: Once | INTRAVENOUS | Status: AC | PRN
  Administered 2017-05-23: 15 mL via INTRAVENOUS

## 2017-05-31 DIAGNOSIS — G8929 Other chronic pain: Secondary | ICD-10-CM | POA: Diagnosis not present

## 2017-05-31 DIAGNOSIS — M545 Low back pain: Secondary | ICD-10-CM | POA: Diagnosis not present

## 2017-05-31 DIAGNOSIS — F419 Anxiety disorder, unspecified: Secondary | ICD-10-CM | POA: Diagnosis not present

## 2017-05-31 DIAGNOSIS — Z712 Person consulting for explanation of examination or test findings: Secondary | ICD-10-CM | POA: Diagnosis not present

## 2017-05-31 DIAGNOSIS — F339 Major depressive disorder, recurrent, unspecified: Secondary | ICD-10-CM | POA: Diagnosis not present

## 2017-05-31 DIAGNOSIS — H60509 Unspecified acute noninfective otitis externa, unspecified ear: Secondary | ICD-10-CM | POA: Diagnosis not present

## 2017-05-31 DIAGNOSIS — Z6826 Body mass index (BMI) 26.0-26.9, adult: Secondary | ICD-10-CM | POA: Diagnosis not present

## 2017-06-13 DIAGNOSIS — M26621 Arthralgia of right temporomandibular joint: Secondary | ICD-10-CM | POA: Diagnosis not present

## 2017-06-13 DIAGNOSIS — Q762 Congenital spondylolisthesis: Secondary | ICD-10-CM | POA: Diagnosis not present

## 2017-06-13 DIAGNOSIS — H9201 Otalgia, right ear: Secondary | ICD-10-CM | POA: Diagnosis not present

## 2017-06-13 DIAGNOSIS — Z6827 Body mass index (BMI) 27.0-27.9, adult: Secondary | ICD-10-CM | POA: Diagnosis not present

## 2017-06-27 DIAGNOSIS — G8929 Other chronic pain: Secondary | ICD-10-CM | POA: Diagnosis not present

## 2017-06-27 DIAGNOSIS — Z6827 Body mass index (BMI) 27.0-27.9, adult: Secondary | ICD-10-CM | POA: Diagnosis not present

## 2017-06-27 DIAGNOSIS — F339 Major depressive disorder, recurrent, unspecified: Secondary | ICD-10-CM | POA: Diagnosis not present

## 2017-06-27 DIAGNOSIS — R739 Hyperglycemia, unspecified: Secondary | ICD-10-CM | POA: Diagnosis not present

## 2017-06-27 DIAGNOSIS — M545 Low back pain: Secondary | ICD-10-CM | POA: Diagnosis not present

## 2017-06-27 DIAGNOSIS — H60509 Unspecified acute noninfective otitis externa, unspecified ear: Secondary | ICD-10-CM | POA: Diagnosis not present

## 2017-06-27 DIAGNOSIS — Z6826 Body mass index (BMI) 26.0-26.9, adult: Secondary | ICD-10-CM | POA: Diagnosis not present

## 2017-06-27 DIAGNOSIS — E782 Mixed hyperlipidemia: Secondary | ICD-10-CM | POA: Diagnosis not present

## 2017-06-27 DIAGNOSIS — D519 Vitamin B12 deficiency anemia, unspecified: Secondary | ICD-10-CM | POA: Diagnosis not present

## 2017-06-27 DIAGNOSIS — E559 Vitamin D deficiency, unspecified: Secondary | ICD-10-CM | POA: Diagnosis not present

## 2017-06-27 DIAGNOSIS — F419 Anxiety disorder, unspecified: Secondary | ICD-10-CM | POA: Diagnosis not present

## 2017-06-27 DIAGNOSIS — R5381 Other malaise: Secondary | ICD-10-CM | POA: Diagnosis not present

## 2017-07-24 DIAGNOSIS — R030 Elevated blood-pressure reading, without diagnosis of hypertension: Secondary | ICD-10-CM | POA: Diagnosis not present

## 2017-07-24 DIAGNOSIS — F419 Anxiety disorder, unspecified: Secondary | ICD-10-CM | POA: Diagnosis not present

## 2017-07-29 DIAGNOSIS — I10 Essential (primary) hypertension: Secondary | ICD-10-CM | POA: Diagnosis not present

## 2017-07-29 DIAGNOSIS — H60509 Unspecified acute noninfective otitis externa, unspecified ear: Secondary | ICD-10-CM | POA: Diagnosis not present

## 2017-07-29 DIAGNOSIS — Z6826 Body mass index (BMI) 26.0-26.9, adult: Secondary | ICD-10-CM | POA: Diagnosis not present

## 2017-07-29 DIAGNOSIS — Z712 Person consulting for explanation of examination or test findings: Secondary | ICD-10-CM | POA: Diagnosis not present

## 2017-07-29 DIAGNOSIS — Z6827 Body mass index (BMI) 27.0-27.9, adult: Secondary | ICD-10-CM | POA: Diagnosis not present

## 2017-07-29 DIAGNOSIS — F419 Anxiety disorder, unspecified: Secondary | ICD-10-CM | POA: Diagnosis not present

## 2017-07-29 DIAGNOSIS — M545 Low back pain: Secondary | ICD-10-CM | POA: Diagnosis not present

## 2017-07-29 DIAGNOSIS — G8929 Other chronic pain: Secondary | ICD-10-CM | POA: Diagnosis not present

## 2017-07-29 DIAGNOSIS — F339 Major depressive disorder, recurrent, unspecified: Secondary | ICD-10-CM | POA: Diagnosis not present

## 2017-08-27 DIAGNOSIS — Z6827 Body mass index (BMI) 27.0-27.9, adult: Secondary | ICD-10-CM | POA: Diagnosis not present

## 2017-08-27 DIAGNOSIS — E782 Mixed hyperlipidemia: Secondary | ICD-10-CM | POA: Diagnosis not present

## 2017-08-27 DIAGNOSIS — I10 Essential (primary) hypertension: Secondary | ICD-10-CM | POA: Diagnosis not present

## 2017-08-27 DIAGNOSIS — J029 Acute pharyngitis, unspecified: Secondary | ICD-10-CM | POA: Diagnosis not present

## 2017-08-27 DIAGNOSIS — F419 Anxiety disorder, unspecified: Secondary | ICD-10-CM | POA: Diagnosis not present

## 2017-09-25 DIAGNOSIS — Z6828 Body mass index (BMI) 28.0-28.9, adult: Secondary | ICD-10-CM | POA: Diagnosis not present

## 2017-09-25 DIAGNOSIS — J019 Acute sinusitis, unspecified: Secondary | ICD-10-CM | POA: Diagnosis not present

## 2017-09-25 DIAGNOSIS — J029 Acute pharyngitis, unspecified: Secondary | ICD-10-CM | POA: Diagnosis not present

## 2017-09-25 DIAGNOSIS — F419 Anxiety disorder, unspecified: Secondary | ICD-10-CM | POA: Diagnosis not present

## 2017-09-25 DIAGNOSIS — R300 Dysuria: Secondary | ICD-10-CM | POA: Diagnosis not present

## 2017-10-25 ENCOUNTER — Ambulatory Visit (HOSPITAL_COMMUNITY)
Admission: RE | Admit: 2017-10-25 | Discharge: 2017-10-25 | Disposition: A | Discharge status: Home / Self Care | Payer: Medicare Other | Source: Ambulatory Visit | Attending: Adult Health Nurse Practitioner | Admitting: Adult Health Nurse Practitioner

## 2017-10-25 ENCOUNTER — Other Ambulatory Visit (HOSPITAL_COMMUNITY): Payer: Self-pay | Admitting: Adult Health Nurse Practitioner

## 2017-10-25 DIAGNOSIS — M546 Pain in thoracic spine: Secondary | ICD-10-CM | POA: Diagnosis not present

## 2017-10-25 DIAGNOSIS — Z981 Arthrodesis status: Secondary | ICD-10-CM | POA: Insufficient documentation

## 2017-10-25 DIAGNOSIS — R300 Dysuria: Secondary | ICD-10-CM | POA: Diagnosis not present

## 2017-10-25 DIAGNOSIS — W19XXXA Unspecified fall, initial encounter: Secondary | ICD-10-CM

## 2017-10-25 DIAGNOSIS — F419 Anxiety disorder, unspecified: Secondary | ICD-10-CM | POA: Diagnosis not present

## 2017-10-25 DIAGNOSIS — M4186 Other forms of scoliosis, lumbar region: Secondary | ICD-10-CM | POA: Insufficient documentation

## 2017-10-25 DIAGNOSIS — M545 Low back pain: Secondary | ICD-10-CM | POA: Diagnosis not present

## 2017-10-25 DIAGNOSIS — Z6828 Body mass index (BMI) 28.0-28.9, adult: Secondary | ICD-10-CM | POA: Diagnosis not present

## 2017-10-25 DIAGNOSIS — M4316 Spondylolisthesis, lumbar region: Secondary | ICD-10-CM | POA: Insufficient documentation

## 2017-11-19 DIAGNOSIS — W19XXXA Unspecified fall, initial encounter: Secondary | ICD-10-CM | POA: Diagnosis not present

## 2017-11-19 DIAGNOSIS — R42 Dizziness and giddiness: Secondary | ICD-10-CM | POA: Diagnosis not present

## 2017-11-19 DIAGNOSIS — F17200 Nicotine dependence, unspecified, uncomplicated: Secondary | ICD-10-CM | POA: Diagnosis not present

## 2017-11-19 DIAGNOSIS — Z79899 Other long term (current) drug therapy: Secondary | ICD-10-CM | POA: Diagnosis not present

## 2017-11-19 DIAGNOSIS — H538 Other visual disturbances: Secondary | ICD-10-CM | POA: Diagnosis not present

## 2017-11-21 ENCOUNTER — Emergency Department (HOSPITAL_COMMUNITY): Payer: Medicare Other

## 2017-11-21 ENCOUNTER — Other Ambulatory Visit: Payer: Self-pay

## 2017-11-21 ENCOUNTER — Emergency Department (HOSPITAL_COMMUNITY)
Admission: EM | Admit: 2017-11-21 | Discharge: 2017-11-21 | Disposition: A | Discharge status: Home / Self Care | Payer: Medicare Other | Attending: Emergency Medicine | Admitting: Emergency Medicine

## 2017-11-21 ENCOUNTER — Encounter (HOSPITAL_COMMUNITY): Payer: Self-pay | Admitting: Emergency Medicine

## 2017-11-21 DIAGNOSIS — S6991XA Unspecified injury of right wrist, hand and finger(s), initial encounter: Secondary | ICD-10-CM | POA: Diagnosis not present

## 2017-11-21 DIAGNOSIS — F17210 Nicotine dependence, cigarettes, uncomplicated: Secondary | ICD-10-CM | POA: Insufficient documentation

## 2017-11-21 DIAGNOSIS — E039 Hypothyroidism, unspecified: Secondary | ICD-10-CM | POA: Diagnosis not present

## 2017-11-21 DIAGNOSIS — W109XXA Fall (on) (from) unspecified stairs and steps, initial encounter: Secondary | ICD-10-CM | POA: Diagnosis not present

## 2017-11-21 DIAGNOSIS — Y999 Unspecified external cause status: Secondary | ICD-10-CM | POA: Diagnosis not present

## 2017-11-21 DIAGNOSIS — S80811A Abrasion, right lower leg, initial encounter: Secondary | ICD-10-CM | POA: Diagnosis not present

## 2017-11-21 DIAGNOSIS — Y9289 Other specified places as the place of occurrence of the external cause: Secondary | ICD-10-CM | POA: Insufficient documentation

## 2017-11-21 DIAGNOSIS — Z79899 Other long term (current) drug therapy: Secondary | ICD-10-CM | POA: Diagnosis not present

## 2017-11-21 DIAGNOSIS — Z23 Encounter for immunization: Secondary | ICD-10-CM | POA: Insufficient documentation

## 2017-11-21 DIAGNOSIS — Y9389 Activity, other specified: Secondary | ICD-10-CM | POA: Diagnosis not present

## 2017-11-21 DIAGNOSIS — T07XXXA Unspecified multiple injuries, initial encounter: Secondary | ICD-10-CM | POA: Diagnosis not present

## 2017-11-21 DIAGNOSIS — S40211A Abrasion of right shoulder, initial encounter: Secondary | ICD-10-CM | POA: Diagnosis not present

## 2017-11-21 DIAGNOSIS — S60051A Contusion of right little finger without damage to nail, initial encounter: Secondary | ICD-10-CM | POA: Diagnosis not present

## 2017-11-21 DIAGNOSIS — S50311A Abrasion of right elbow, initial encounter: Secondary | ICD-10-CM | POA: Diagnosis not present

## 2017-11-21 DIAGNOSIS — S6000XA Contusion of unspecified finger without damage to nail, initial encounter: Secondary | ICD-10-CM

## 2017-11-21 DIAGNOSIS — S60221A Contusion of right hand, initial encounter: Secondary | ICD-10-CM | POA: Diagnosis not present

## 2017-11-21 DIAGNOSIS — J449 Chronic obstructive pulmonary disease, unspecified: Secondary | ICD-10-CM | POA: Diagnosis not present

## 2017-11-21 DIAGNOSIS — M7989 Other specified soft tissue disorders: Secondary | ICD-10-CM | POA: Diagnosis not present

## 2017-11-21 MED ORDER — BACITRACIN-NEOMYCIN-POLYMYXIN 400-5-5000 EX OINT
TOPICAL_OINTMENT | Freq: Once | CUTANEOUS | Status: AC
  Administered 2017-11-21: 1 via TOPICAL
  Filled 2017-11-21: qty 1

## 2017-11-21 MED ORDER — TETANUS-DIPHTH-ACELL PERTUSSIS 5-2.5-18.5 LF-MCG/0.5 IM SUSP
0.5000 mL | Freq: Once | INTRAMUSCULAR | Status: AC
  Administered 2017-11-21: 0.5 mL via INTRAMUSCULAR
  Filled 2017-11-21: qty 0.5

## 2017-11-21 MED ORDER — IBUPROFEN 400 MG PO TABS
400.0000 mg | ORAL_TABLET | Freq: Once | ORAL | Status: AC
  Administered 2017-11-21: 400 mg via ORAL
  Filled 2017-11-21: qty 1
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
## 2017-11-21 NOTE — Discharge Instructions (Addendum)
Take ibuprofen as needed for pain. Follow up with DR. Hall.

## 2017-11-21 NOTE — ED Triage Notes (Addendum)
Pt c/o right hand pain due to fall yesterday. Pt has own ace wrap on. Abrasions noted to right elbow/fa. Mild swelling noted to right hand and wrist. rom limited due to pain.

## 2017-11-27 DIAGNOSIS — W57XXXA Bitten or stung by nonvenomous insect and other nonvenomous arthropods, initial encounter: Secondary | ICD-10-CM | POA: Diagnosis not present

## 2017-11-27 DIAGNOSIS — F419 Anxiety disorder, unspecified: Secondary | ICD-10-CM | POA: Diagnosis not present

## 2017-11-27 DIAGNOSIS — E782 Mixed hyperlipidemia: Secondary | ICD-10-CM | POA: Diagnosis not present

## 2017-11-27 DIAGNOSIS — J029 Acute pharyngitis, unspecified: Secondary | ICD-10-CM | POA: Diagnosis not present

## 2017-11-27 DIAGNOSIS — I10 Essential (primary) hypertension: Secondary | ICD-10-CM | POA: Diagnosis not present

## 2017-11-27 DIAGNOSIS — M545 Low back pain: Secondary | ICD-10-CM | POA: Diagnosis not present

## 2017-11-27 DIAGNOSIS — R296 Repeated falls: Secondary | ICD-10-CM | POA: Diagnosis not present

## 2017-11-27 DIAGNOSIS — R300 Dysuria: Secondary | ICD-10-CM | POA: Diagnosis not present

## 2017-11-27 DIAGNOSIS — H60509 Unspecified acute noninfective otitis externa, unspecified ear: Secondary | ICD-10-CM | POA: Diagnosis not present

## 2017-11-27 DIAGNOSIS — J019 Acute sinusitis, unspecified: Secondary | ICD-10-CM | POA: Diagnosis not present

## 2017-11-27 DIAGNOSIS — Z6828 Body mass index (BMI) 28.0-28.9, adult: Secondary | ICD-10-CM | POA: Diagnosis not present

## 2017-11-27 DIAGNOSIS — Z6826 Body mass index (BMI) 26.0-26.9, adult: Secondary | ICD-10-CM | POA: Diagnosis not present

## 2017-11-27 DIAGNOSIS — F339 Major depressive disorder, recurrent, unspecified: Secondary | ICD-10-CM | POA: Diagnosis not present

## 2017-11-27 DIAGNOSIS — J060 Acute laryngopharyngitis: Secondary | ICD-10-CM | POA: Diagnosis not present

## 2017-11-27 DIAGNOSIS — R030 Elevated blood-pressure reading, without diagnosis of hypertension: Secondary | ICD-10-CM | POA: Diagnosis not present

## 2017-12-25 DIAGNOSIS — M25531 Pain in right wrist: Secondary | ICD-10-CM | POA: Diagnosis not present

## 2017-12-25 DIAGNOSIS — M545 Low back pain: Secondary | ICD-10-CM | POA: Diagnosis not present

## 2017-12-25 DIAGNOSIS — F319 Bipolar disorder, unspecified: Secondary | ICD-10-CM | POA: Diagnosis not present

## 2017-12-25 DIAGNOSIS — F419 Anxiety disorder, unspecified: Secondary | ICD-10-CM | POA: Diagnosis not present

## 2017-12-25 DIAGNOSIS — F339 Major depressive disorder, recurrent, unspecified: Secondary | ICD-10-CM | POA: Diagnosis not present

## 2017-12-25 DIAGNOSIS — G8929 Other chronic pain: Secondary | ICD-10-CM | POA: Diagnosis not present

## 2017-12-25 DIAGNOSIS — E782 Mixed hyperlipidemia: Secondary | ICD-10-CM | POA: Diagnosis not present

## 2017-12-25 DIAGNOSIS — E559 Vitamin D deficiency, unspecified: Secondary | ICD-10-CM | POA: Diagnosis not present

## 2017-12-26 DIAGNOSIS — Z23 Encounter for immunization: Secondary | ICD-10-CM | POA: Diagnosis not present

## 2018-04-02 DIAGNOSIS — E782 Mixed hyperlipidemia: Secondary | ICD-10-CM | POA: Diagnosis not present

## 2018-04-02 DIAGNOSIS — I10 Essential (primary) hypertension: Secondary | ICD-10-CM | POA: Diagnosis not present

## 2018-04-02 DIAGNOSIS — E559 Vitamin D deficiency, unspecified: Secondary | ICD-10-CM | POA: Diagnosis not present

## 2018-04-28 DIAGNOSIS — E559 Vitamin D deficiency, unspecified: Secondary | ICD-10-CM | POA: Diagnosis not present

## 2018-04-28 DIAGNOSIS — J019 Acute sinusitis, unspecified: Secondary | ICD-10-CM | POA: Diagnosis not present

## 2018-04-28 DIAGNOSIS — F3131 Bipolar disorder, current episode depressed, mild: Secondary | ICD-10-CM | POA: Diagnosis not present

## 2018-04-28 DIAGNOSIS — Z6826 Body mass index (BMI) 26.0-26.9, adult: Secondary | ICD-10-CM | POA: Diagnosis not present

## 2018-04-28 DIAGNOSIS — Z6828 Body mass index (BMI) 28.0-28.9, adult: Secondary | ICD-10-CM | POA: Diagnosis not present

## 2018-04-28 DIAGNOSIS — E782 Mixed hyperlipidemia: Secondary | ICD-10-CM | POA: Diagnosis not present

## 2018-04-28 DIAGNOSIS — F419 Anxiety disorder, unspecified: Secondary | ICD-10-CM | POA: Diagnosis not present

## 2018-04-28 DIAGNOSIS — R300 Dysuria: Secondary | ICD-10-CM | POA: Diagnosis not present

## 2018-04-28 DIAGNOSIS — M25531 Pain in right wrist: Secondary | ICD-10-CM | POA: Diagnosis not present

## 2018-04-28 DIAGNOSIS — H60509 Unspecified acute noninfective otitis externa, unspecified ear: Secondary | ICD-10-CM | POA: Diagnosis not present

## 2018-04-28 DIAGNOSIS — R945 Abnormal results of liver function studies: Secondary | ICD-10-CM | POA: Diagnosis not present

## 2018-04-28 DIAGNOSIS — F319 Bipolar disorder, unspecified: Secondary | ICD-10-CM | POA: Diagnosis not present

## 2018-04-28 DIAGNOSIS — R030 Elevated blood-pressure reading, without diagnosis of hypertension: Secondary | ICD-10-CM | POA: Diagnosis not present

## 2018-04-28 DIAGNOSIS — K760 Fatty (change of) liver, not elsewhere classified: Secondary | ICD-10-CM | POA: Diagnosis not present

## 2018-04-28 DIAGNOSIS — F39 Unspecified mood [affective] disorder: Secondary | ICD-10-CM | POA: Diagnosis not present

## 2018-04-28 DIAGNOSIS — I10 Essential (primary) hypertension: Secondary | ICD-10-CM | POA: Diagnosis not present

## 2018-04-28 DIAGNOSIS — J029 Acute pharyngitis, unspecified: Secondary | ICD-10-CM | POA: Diagnosis not present

## 2018-05-06 ENCOUNTER — Other Ambulatory Visit (HOSPITAL_COMMUNITY): Payer: Self-pay | Admitting: Adult Health Nurse Practitioner

## 2018-05-06 ENCOUNTER — Other Ambulatory Visit: Payer: Self-pay | Admitting: Adult Health Nurse Practitioner

## 2018-05-06 ENCOUNTER — Ambulatory Visit (HOSPITAL_COMMUNITY)
Admission: RE | Admit: 2018-05-06 | Discharge: 2018-05-06 | Disposition: A | Discharge status: Home / Self Care | Payer: Medicare Other | Source: Ambulatory Visit | Attending: Adult Health Nurse Practitioner | Admitting: Adult Health Nurse Practitioner

## 2018-05-06 DIAGNOSIS — Z6826 Body mass index (BMI) 26.0-26.9, adult: Secondary | ICD-10-CM | POA: Diagnosis not present

## 2018-05-06 DIAGNOSIS — H60509 Unspecified acute noninfective otitis externa, unspecified ear: Secondary | ICD-10-CM | POA: Diagnosis not present

## 2018-05-06 DIAGNOSIS — R945 Abnormal results of liver function studies: Secondary | ICD-10-CM

## 2018-05-06 DIAGNOSIS — R7989 Other specified abnormal findings of blood chemistry: Secondary | ICD-10-CM

## 2018-05-06 DIAGNOSIS — R160 Hepatomegaly, not elsewhere classified: Secondary | ICD-10-CM

## 2018-05-06 DIAGNOSIS — J019 Acute sinusitis, unspecified: Secondary | ICD-10-CM | POA: Diagnosis not present

## 2018-05-06 DIAGNOSIS — E782 Mixed hyperlipidemia: Secondary | ICD-10-CM | POA: Diagnosis not present

## 2018-05-06 DIAGNOSIS — E559 Vitamin D deficiency, unspecified: Secondary | ICD-10-CM | POA: Diagnosis not present

## 2018-05-06 DIAGNOSIS — F319 Bipolar disorder, unspecified: Secondary | ICD-10-CM | POA: Diagnosis not present

## 2018-05-06 DIAGNOSIS — R030 Elevated blood-pressure reading, without diagnosis of hypertension: Secondary | ICD-10-CM | POA: Diagnosis not present

## 2018-05-06 DIAGNOSIS — M25531 Pain in right wrist: Secondary | ICD-10-CM | POA: Diagnosis not present

## 2018-05-06 DIAGNOSIS — R300 Dysuria: Secondary | ICD-10-CM | POA: Diagnosis not present

## 2018-05-06 DIAGNOSIS — R195 Other fecal abnormalities: Secondary | ICD-10-CM | POA: Diagnosis not present

## 2018-05-06 DIAGNOSIS — J029 Acute pharyngitis, unspecified: Secondary | ICD-10-CM | POA: Diagnosis not present

## 2018-05-06 DIAGNOSIS — Z6828 Body mass index (BMI) 28.0-28.9, adult: Secondary | ICD-10-CM | POA: Diagnosis not present

## 2018-05-06 DIAGNOSIS — N390 Urinary tract infection, site not specified: Secondary | ICD-10-CM | POA: Diagnosis not present

## 2018-05-06 DIAGNOSIS — I10 Essential (primary) hypertension: Secondary | ICD-10-CM | POA: Diagnosis not present

## 2018-05-06 DIAGNOSIS — K5900 Constipation, unspecified: Secondary | ICD-10-CM | POA: Diagnosis not present

## 2018-05-06 MED ORDER — BARIUM SULFATE 2.1 % PO SUSP
ORAL | Status: AC
  Filled 2018-05-06: qty 2

## 2018-05-23 ENCOUNTER — Encounter: Payer: Self-pay | Admitting: Gastroenterology

## 2018-06-02 DIAGNOSIS — N390 Urinary tract infection, site not specified: Secondary | ICD-10-CM | POA: Diagnosis not present

## 2018-06-02 DIAGNOSIS — R945 Abnormal results of liver function studies: Secondary | ICD-10-CM | POA: Diagnosis not present

## 2018-06-02 DIAGNOSIS — K5900 Constipation, unspecified: Secondary | ICD-10-CM | POA: Diagnosis not present

## 2018-06-08 DIAGNOSIS — Z888 Allergy status to other drugs, medicaments and biological substances status: Secondary | ICD-10-CM | POA: Diagnosis not present

## 2018-06-08 DIAGNOSIS — M25512 Pain in left shoulder: Secondary | ICD-10-CM | POA: Diagnosis not present

## 2018-06-08 DIAGNOSIS — H9201 Otalgia, right ear: Secondary | ICD-10-CM | POA: Diagnosis not present

## 2018-06-08 DIAGNOSIS — W108XXA Fall (on) (from) other stairs and steps, initial encounter: Secondary | ICD-10-CM | POA: Diagnosis not present

## 2018-06-08 DIAGNOSIS — I10 Essential (primary) hypertension: Secondary | ICD-10-CM | POA: Diagnosis not present

## 2018-06-08 DIAGNOSIS — M26621 Arthralgia of right temporomandibular joint: Secondary | ICD-10-CM | POA: Diagnosis not present

## 2018-06-08 DIAGNOSIS — E7800 Pure hypercholesterolemia, unspecified: Secondary | ICD-10-CM | POA: Diagnosis not present

## 2018-06-08 DIAGNOSIS — Z885 Allergy status to narcotic agent status: Secondary | ICD-10-CM | POA: Diagnosis not present

## 2018-06-08 DIAGNOSIS — S4992XA Unspecified injury of left shoulder and upper arm, initial encounter: Secondary | ICD-10-CM | POA: Diagnosis not present

## 2018-06-08 DIAGNOSIS — S40012A Contusion of left shoulder, initial encounter: Secondary | ICD-10-CM | POA: Diagnosis not present

## 2018-06-08 DIAGNOSIS — F17200 Nicotine dependence, unspecified, uncomplicated: Secondary | ICD-10-CM | POA: Diagnosis not present

## 2018-06-08 DIAGNOSIS — Z79899 Other long term (current) drug therapy: Secondary | ICD-10-CM | POA: Diagnosis not present

## 2018-07-09 DIAGNOSIS — J019 Acute sinusitis, unspecified: Secondary | ICD-10-CM | POA: Diagnosis not present

## 2018-07-10 DIAGNOSIS — Z23 Encounter for immunization: Secondary | ICD-10-CM | POA: Diagnosis not present

## 2018-07-22 ENCOUNTER — Ambulatory Visit (INDEPENDENT_AMBULATORY_CARE_PROVIDER_SITE_OTHER): Payer: Medicare Other | Admitting: Gastroenterology

## 2018-07-22 ENCOUNTER — Other Ambulatory Visit: Payer: Self-pay

## 2018-07-22 ENCOUNTER — Encounter: Payer: Self-pay | Admitting: Gastroenterology

## 2018-07-22 VITALS — BP 130/85 | HR 63 | Temp 97.7°F | Ht 63.0 in | Wt 156.6 lb

## 2018-07-22 DIAGNOSIS — K5909 Other constipation: Secondary | ICD-10-CM | POA: Diagnosis not present

## 2018-07-22 DIAGNOSIS — R1310 Dysphagia, unspecified: Secondary | ICD-10-CM | POA: Diagnosis not present

## 2018-07-22 DIAGNOSIS — R945 Abnormal results of liver function studies: Secondary | ICD-10-CM | POA: Diagnosis not present

## 2018-07-22 DIAGNOSIS — R1319 Other dysphagia: Secondary | ICD-10-CM | POA: Insufficient documentation

## 2018-07-22 DIAGNOSIS — R7989 Other specified abnormal findings of blood chemistry: Secondary | ICD-10-CM

## 2018-07-22 MED ORDER — LINACLOTIDE 290 MCG PO CAPS: 290.0000 ug | ORAL_CAPSULE | Freq: Every day | ORAL | 11 refills | Status: AC

## 2018-07-22 NOTE — Assessment & Plan Note (Signed)
Received labs from January.  Minimally elevated alkaline phosphatase.  Transaminases and total bilirubin normal.  Viral markers negative.  Liver unremarkable on noncontrast CT.  Can continue to be followed by PCP.  If rising alkaline phosphatase, would need further work-up.

## 2018-07-22 NOTE — Assessment & Plan Note (Signed)
Solid food/pill dysphagia.  Has been able to compensate with chewing her food thoroughly.  Occasional reflux but has not required chronic medications.  For now not interested in daily PPI.  With current COVID-19 crisis, we will continue to monitor the symptoms, consider EGD with esophageal dilation at a later date.  Patient in agreement.  She will call with any worsening symptoms.

## 2018-07-22 NOTE — Patient Instructions (Addendum)
We will review labs from PCP and let you know if there are any further needs.  You may take over-the-counter medications such as Tums or Pepcid as needed for heartburn.  Start Linzess 290 mcg once daily on an empty stomach for constipation.  Please take every day unless you need to hold a day or two for diarrhea.  We will set up a virtual visit in 4 to 6 weeks to see how you are doing.  Once we are out of COVID-19 crisis, would consider upper endoscopy to further evaluate your difficulty swallowing.

## 2018-07-22 NOTE — Assessment & Plan Note (Addendum)
48 year old female presenting with acute on chronic constipation.  Really has not received adequate treatment as patient has not been taking Amitiza regularly.  Would not necessarily consider Amitiza a failure but because she needs new prescription and for simplicity we will try Linzess 290 mcg daily.  Prescription sent in.  Plan for virtual visit in 4 to 6 weeks.  Call sooner if needed.

## 2018-07-22 NOTE — Progress Notes (Signed)
Primary Care Physician:  Celene Squibb, MD  Primary Gastroenterologist:  Barney Drain, MD   Chief Complaint  Patient presents with  . Abdominal Pain    comes and goes and worse when pt hasn't had a bowel movement  . Constipation    1 month since last bowel movement     HPI:  Brooke Maddox is a 48 y.o. female here at the request of Dr. [name] for further evaluation of abdominal pain and constipation.  She had a CT abdomen pelvis without contrast in January.  She had moderate colonic stool burden noted.  She reports history of abnormal LFTs, negative viral markers.  At baseline typically has a bowel movement once every 1 to 2 weeks as long as she can remember.  Over the past several months she has had worsening constipation.  States she is went up to 45 days without a bowel movement.  Initially was started on MiraLAX 3 times daily but did not have any results.  Then she was provided with Amitiza 24 mcg.  Took 1 pill and had a "cleanout".  Several weeks later after not having a bowel movement she tried one again but did not have any results.  Never took on a daily basis.  Did not understand she was supposed to.  She denies any melena or rectal bleeding.   She has heartburn about 1-2 times per week.  Previously had taken omeprazole but nothing like that lately.  She has difficulty swallowing pills and sometimes solid foods, does better if she chews her food thoroughly.  No prior EGD or colonoscopy.  Current Outpatient Medications  Medication Sig Dispense Refill  . atorvastatin (LIPITOR) 20 MG tablet Take 20 mg by mouth at bedtime.    . cloNIDine (CATAPRES) 0.1 MG tablet Take 1 tablet by mouth daily.  1  . diazepam (VALIUM) 5 MG tablet Take 1 tablet (5 mg total) by mouth every 6 (six) hours as needed for muscle spasms. 30 tablet 0  . FLUoxetine (PROZAC) 40 MG capsule Take 80 mg by mouth daily.  2  . lamoTRIgine (LAMICTAL) 200 MG tablet Take 200 mg by mouth 2 (two) times daily.     . nitroGLYCERIN  (NITROSTAT) 0.4 MG SL tablet Place 1 tablet (0.4 mg total) under the tongue every 5 (five) minutes as needed for chest pain. 25 tablet 3   Current Facility-Administered Medications  Medication Dose Route Frequency Provider Last Rate Last Dose  . beclomethasone (QVAR) 40 MCG/ACT inhaler 1 puff  1 puff Inhalation BID Fayrene Helper, MD        Allergies as of 07/22/2018 - Review Complete 07/22/2018  Allergen Reaction Noted  . Lithium carbonate Other (See Comments)   . Tylenol [acetaminophen]  12/08/2013  . Wellbutrin [bupropion] Other (See Comments) 05/29/2014  . Codeine Nausea And Vomiting 09/13/2008  . Dilaudid [hydromorphone] Other (See Comments) 03/13/2013  . Divalproex sodium Rash 09/13/2008  . Fentanyl Rash 07/15/2013  . Iodinated diagnostic agents Itching 11/08/2015  . Iodine Rash 09/13/2008  . Iohexol Rash 12/06/2014  . Morphine and related Other (See Comments) 12/10/2011  . Penicillins Nausea And Vomiting and Rash 09/21/2008  . Sulfur Rash 10/14/2013    Past Medical History:  Diagnosis Date  . Abdominal pain, acute, left lower quadrant   . Acute knee pain    right   . Anxiety associated with depression 2007   hospitalised for mental health problems   . Arthritis   . Bipolar affective disorder ([place])   .  Chronic back pain   . Chronic constipation   . Chronic leg pain    left  . Collagen vascular disease ([place])   . COPD (chronic obstructive pulmonary disease) ([place])   . Depression   . Fatigue   . Heart murmur   . History of leukocytosis   . History of seizure disorder   . Hyperlipidemia   . Hypertension   . Hypothyroidism   . Leukocytosis 10/22/2014  . Lumbar radiculopathy    left  . Migraines   . Nausea   . Nicotine addiction   . Obesity   . Osteoarthritis    mild   . Pain management   . PONV (postoperative nausea and vomiting)   . Renal insufficiency   . Seizures ([place])    Dr. Theda Sers     Past Surgical History:  Procedure Laterality Date  .  ABDOMINAL HYSTERECTOMY  2009  . BACK SURGERY     with titanium  . LEFT HEART CATHETERIZATION WITH CORONARY ANGIOGRAM N/A 03/17/2013   Procedure: LEFT HEART CATHETERIZATION WITH CORONARY ANGIOGRAM;  Surgeon: Peter M Martinique, MD;  Location: [facility] CATH LAB;  Service: Cardiovascular;  Laterality: N/A;  . POSTERIOR LUMBAR FUSION  2017   Dr. Christella Noa    Family History  Problem Relation Age of Onset  . COPD Father   . Hypertension Father   . Arthritis Brother   . Colon cancer Neg Hx     Social History   Socioeconomic History  . Marital status: Single    Spouse name: Not on file  . Number of children: 2  . Years of education: Not on file  . Highest education level: Not on file  Occupational History  . 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 Financial resource strain: Not on file  . Food insecurity:    Worry: Not on file    Inability: Not on file  . Transportation needs:    Medical: Not on file    Non-medical: Not on file  Tobacco Use  . Smoking status: Current Every Day Smoker    Packs/day: 0.50    Years: 35.00    Pack years: 17.50    Types: Cigarettes    Start date: 08/30/1986  . Smokeless tobacco: Never Used  Substance and Sexual Activity  . Alcohol use: No    Alcohol/week: 0.0 standard drinks    Comment: for 11 years states that she has quit   . Drug use: Not Currently    Types: "Crack" cocaine    Comment: for 11 years states that she quit; "quit 19 years ago" 11/08/15  . Sexual activity: Not Currently  Lifestyle  . Physical activity:    Days per week: Not on file    Minutes per session: Not on file  . Stress: Not on file  Relationships  . Social connections:    Talks on phone: Not on file    Gets together: Not on file    Attends religious service: Not on file    Active member of club or organization: Not on file    Attends meetings of clubs or organizations: Not on file    Relationship status: Not on file  . Intimate partner  violence:    Fear of current or ex partner: Not on file    Emotionally abused: Not on file    Physically abused: Not on file    Forced sexual activity: Not on file  Other Topics  Concern  . Not on file  Social History Narrative   Lives with a friend . Has 2 children ages 98 and 68      ROS:  General: Negative for anorexia, weight loss, fever, chills, fatigue, weakness. Eyes: Negative for vision changes.  ENT: Negative for hoarseness, difficulty swallowing , nasal congestion. CV: Negative for chest pain, angina, palpitations, dyspnea on exertion, peripheral edema.  Respiratory: Negative for dyspnea at rest, dyspnea on exertion, cough, sputum, wheezing.  GI: See history of present illness. GU:  Negative for dysuria, hematuria, urinary incontinence, urinary frequency, nocturnal urination.  MS: Negative for joint pain, low back pain.  Derm: Negative for rash or itching.  Neuro: Negative for weakness, abnormal sensation, seizure, frequent headaches, memory loss, confusion.  Psych: Negative for anxiety, depression, suicidal ideation, hallucinations.  Endo: Negative for unusual weight change.  Heme: Negative for bruising or bleeding. Allergy: Negative for rash or hives.    Physical Examination:  BP 130/85   Pulse 63   Temp 97.7 F (36.5 C) (Oral)   Ht 5\' 3"  (1.6 m)   Wt 156 lb 9.6 oz (71 kg)   BMI 27.74 kg/m    General: Well-nourished, well-developed in no acute distress.  Head: Normocephalic, atraumatic.   Eyes: Conjunctiva pink, no icterus. Mouth: Oropharyngeal mucosa moist and pink , no lesions erythema or exudate. Neck: Supple without thyromegaly, masses, or lymphadenopathy.  Lungs: Clear to auscultation bilaterally.  Heart: Regular rate and rhythm, no murmurs rubs or gallops.  Abdomen: Bowel sounds are normal, mild diffuse tenderness, nondistended, no hepatosplenomegaly or masses, no abdominal bruits or    hernia , no rebound or guarding.   Rectal: not  performed Extremities: No lower extremity edema. No clubbing or deformities.  Neuro: Alert and oriented x 4 , grossly normal neurologically.  Skin: Warm and dry, no rash or jaundice.   Psych: Alert and cooperative, normal mood and affect.  Labs: Received copy of labs from PCP dated May 06, 2018.  Glucose 88, BUN 8, creatinine 1.36, sodium 140, potassium 4, albumin 4.2, total bilirubin 0.3, alkaline phosphatase 165 elevated, AST 22, ALT 17, hepatitis A IgM negative, hepatitis B surface antigen negative, hepatitis B core IgM negative  Imaging Studies: No results found.

## 2018-07-23 NOTE — Progress Notes (Signed)
cc'ed to pcp °

## 2018-09-04 ENCOUNTER — Ambulatory Visit (INDEPENDENT_AMBULATORY_CARE_PROVIDER_SITE_OTHER): Payer: Medicare Other | Admitting: Gastroenterology

## 2018-09-04 ENCOUNTER — Other Ambulatory Visit: Payer: Self-pay

## 2018-09-04 ENCOUNTER — Encounter: Payer: Self-pay | Admitting: Gastroenterology

## 2018-09-04 DIAGNOSIS — R1319 Other dysphagia: Secondary | ICD-10-CM

## 2018-09-04 DIAGNOSIS — R1310 Dysphagia, unspecified: Secondary | ICD-10-CM | POA: Diagnosis not present

## 2018-09-04 DIAGNOSIS — K5909 Other constipation: Secondary | ICD-10-CM

## 2018-09-04 MED ORDER — POLYETHYLENE GLYCOL 3350 17 GM/SCOOP PO POWD: ORAL | 5 refills | Status: AC

## 2018-09-04 MED ORDER — PLECANATIDE 3 MG PO TABS: 3.0000 mg | ORAL_TABLET | Freq: Every day | ORAL | 5 refills | Status: AC

## 2018-09-04 NOTE — Progress Notes (Signed)
Primary Care Physician:  Celene Squibb, MD Primary GI:  Barney Drain, MD   Patient Location: Home  Provider Location: [facility] office  Reason for Phone Visit: abd pain, constipation  Persons present on the phone encounter, with roles: Patient, myself (provider),Angela Stalling CMA(updated meds and allergies)  Total time (minutes) spent on medical discussion: 35 minutes  Due to COVID-19, visit was conducted using telephonic method (no video was available).  Visit was requested by patient.  Virtual Visit via Telephone only  I connected with Brooke Maddox on 09/04/18 at 11:00 AM EDT by telephone and verified that I am speaking with the correct person using two identifiers.   I discussed the limitations, risks, security and privacy concerns of performing an evaluation and management service by telephone and the availability of in person appointments. I also discussed with the patient that there may be a patient responsible charge related to this service. The patient expressed understanding and agreed to proceed.        Chief Complaint  Patient presents with  . Constipation    Belly swells,R sided pain    HPI:   Brooke Maddox is a 48 y.o. female who presents for virtual visit regarding abdominal pain, constipation.  She was seen back in March. CT abdomen pelvis without contrast January 2020 showed moderate colonic stool burden.  She also complained of abnormal LFTs, negative viral markers per her report.  We received copy of labs from January 2020, she had a minimally elevated alkaline phosphatase at 165, other LFTs normal.  Viral markers were negative.  Liver was unremarkable on noncontrast CT.  Encouraged follow-up labs with PCP, further work-up if alk phos was significantly elevated or rising.  Could go up to 45 days without a bowel movement.  Frequently going 2-3 weeks between BMs.  Had failed MiraLAX 3 times daily.  She took one Amitiza 24 mcg ended with being "cleaned out".  She did not  pursue daily regimen and after 3 weeks without a bowel movement she tried one again but did not have results.  Also complained of some vague dysphagia if she did not chew her food thoroughly.  She had some intermittent occasional reflux symptoms, she was not interested in daily PPI and wanted to postpone possible EGD until later date given COVID-19 pandemic.  After last office visit we started her on Linzess 290 mcg daily. She states initially she had a good blowe out. After that she would go once a week. She started back taking Amitiza 5mg daily with it as well. Still BM once every one week or so. Belly keeps swelling. Always has had bad constipation. Denies pain pills. No N/V. No melena, brbpr. Still with some intermittent heartburn. Having solid food dysphagia. Wants EGD/ED eventually. No prior TCS.   She fell down her porch steps yesterday. Hit her head. She denies LOC. In general her medications may her drowsy. She denies headache but has felt a little off balance. No chest pain or SOB.  Denies etoh/illicit drug use.   Current Outpatient Medications  Medication Sig Dispense Refill  . atorvastatin (LIPITOR) 20 MG tablet Take 20 mg by mouth at bedtime.    . cloNIDine (CATAPRES) 0.1 MG tablet Take 1 tablet by mouth daily.  1  . diazepam (VALIUM) 5 MG tablet Take 1 tablet (5 mg total) by mouth every 6 (six) hours as needed for muscle spasms. 30 tablet 0  . FLUoxetine (PROZAC) 40 MG capsule Take 80 mg  by mouth daily.  2  . lamoTRIgine (LAMICTAL) 200 MG tablet Take 200 mg by mouth daily.    Marland Kitchen linaclotide (LINZESS) 290 MCG CAPS capsule Take 1 capsule (290 mcg total) by mouth daily before breakfast. 30 capsule 11  . lubiprostone (AMITIZA) 24 MCG capsule Take 24 mcg by mouth daily.    . nitroGLYCERIN (NITROSTAT) 0.4 MG SL tablet Place 1 tablet (0.4 mg total) under the tongue every 5 (five) minutes as needed for chest pain. 25 tablet 3   Current Facility-Administered Medications  Medication Dose  Route Frequency Provider Last Rate Last Dose  . beclomethasone (QVAR) 40 MCG/ACT inhaler 1 puff  1 puff Inhalation BID Fayrene Helper, MD        Past Medical History:  Diagnosis Date  . Abdominal pain, acute, left lower quadrant   . Acute knee pain    right   . Anxiety associated with depression 2007   hospitalised for mental health problems   . Arthritis   . Bipolar affective disorder ([place])   . Chronic back pain   . Chronic constipation   . Chronic leg pain    left  . Collagen vascular disease ([place])   . COPD (chronic obstructive pulmonary disease) ([place])   . Depression   . Fatigue   . Heart murmur   . History of leukocytosis   . History of seizure disorder   . Hyperlipidemia   . Hypertension   . Hypothyroidism   . Leukocytosis 10/22/2014  . Lumbar radiculopathy    left  . Migraines   . Nausea   . Nicotine addiction   . Obesity   . Osteoarthritis    mild   . Pain management   . PONV (postoperative nausea and vomiting)   . Renal insufficiency   . Seizures ([place])    Dr. Theda Sers     Past Surgical History:  Procedure Laterality Date  . ABDOMINAL HYSTERECTOMY  2009  . BACK SURGERY     with titanium  . LEFT HEART CATHETERIZATION WITH CORONARY ANGIOGRAM N/A 03/17/2013   Procedure: LEFT HEART CATHETERIZATION WITH CORONARY ANGIOGRAM;  Surgeon: Peter M Martinique, MD;  Location: [facility] CATH LAB;  Service: Cardiovascular;  Laterality: N/A;  . POSTERIOR LUMBAR FUSION  2017   Dr. Christella Noa    Family History  Problem Relation Age of Onset  . COPD Father   . Hypertension Father   . Arthritis Brother   . Colon cancer Neg Hx     Social History   Socioeconomic History  . Marital status: Single    Spouse name: Not on file  . Number of children: 2  . Years of education: Not on file  . Highest education level: Not on file  Occupational History  . 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 Financial resource strain: Not on file  . Food  insecurity:    Worry: Not on file    Inability: Not on file  . Transportation needs:    Medical: Not on file    Non-medical: Not on file  Tobacco Use  . Smoking status: Current Every Day Smoker    Packs/day: 0.50    Years: 35.00    Pack years: 17.50    Types: Cigarettes    Start date: 08/30/1986  . Smokeless tobacco: Never Used  Substance and Sexual Activity  . Alcohol use: No    Alcohol/week: 0.0 standard drinks    Comment: for 11 years  states that she has quit   . Drug use: Not Currently    Types: "Crack" cocaine    Comment: for 11 years states that she quit; "quit 19 years ago" 11/08/15  . Sexual activity: Not Currently  Lifestyle  . Physical activity:    Days per week: Not on file    Minutes per session: Not on file  . Stress: Not on file  Relationships  . Social connections:    Talks on phone: Not on file    Gets together: Not on file    Attends religious service: Not on file    Active member of club or organization: Not on file    Attends meetings of clubs or organizations: Not on file    Relationship status: Not on file  . Intimate partner violence:    Fear of current or ex partner: Not on file    Emotionally abused: Not on file    Physically abused: Not on file    Forced sexual activity: Not on file  Other Topics Concern  . Not on file  Social History Narrative   Lives with a friend . Has 2 children ages 33 and 87      ROS:  General: Negative for anorexia, weight loss, fever, chills, fatigue, weakness. Eyes: Negative for vision changes.  ENT: Negative for hoarseness, difficulty swallowing , nasal congestion. CV: Negative for chest pain, angina, palpitations, dyspnea on exertion, peripheral edema.  Respiratory: Negative for dyspnea at rest, dyspnea on exertion, cough, sputum, wheezing.  GI: See history of present illness. GU:  Negative for dysuria, hematuria, urinary incontinence, urinary frequency, nocturnal urination.  MS: Negative for joint pain, low  back pain.  Derm: Negative for rash or itching.  Neuro: Negative for weakness, abnormal sensation, seizure, frequent headaches, memory loss, confusion. See hpi Psych: Negative for anxiety, depression, suicidal ideation, hallucinations.  Endo: Negative for unusual weight change.  Heme: Negative for bruising or bleeding. Allergy: Negative for rash or hives.   Observations/Objective: Drowsy, slurred speech at first. Patient reports she took her meds about an hour before the phone call and just woke up again. Improved by end of phone call. Otherwise exam not available.   Assessment and Plan: 48 y/o female with chronic constipation and abdominal bloating poorly controlled. Associated upper abd discomfort. She is taking Linzess 280mg and Amitiza 258m once daily (restarted Amitiza after last OV here). She is having BM only once per week. Feels bloated all the time. Will stop Linzess and Amitiza. Trial of Trulance 21m78maily. Add back miralx 17 grams bid. Ov in 2 months. Call sooner if no improvement.  Continues with solid food dysphagia, likely pursue EGD/ED in the next 1-2 months. Holding until after next OV due to COVID 19 pandemic per patient request.   She is noted to have somewhat slurred speech and sounds drowsy initially on call. She reports her meds make her this way but also reports she fell yesterday and hit her head. Denies LOC, no headache. Has felt a little off balance but that is better. ?concussion. I encouraged her to seek medical attention if symptoms persist or worsen and she agrees.      Follow Up Instructions:  I discussed the assessment and treatment plan with the patient. The patient was provided an opportunity to ask questions and all were answered. The patient agreed with the plan and demonstrated an understanding of the instructions. AVS mailed to patient's home address.   The patient was advised to call back  or seek an in-person evaluation if the symptoms worsen or if the  condition fails to improve as anticipated.  I provided 35 minutes of non-face-to-face time during this encounter.

## 2018-09-04 NOTE — Patient Instructions (Signed)
1. Stop Linzess. Stop Amitiza.  2. Start Trulance 3mg  daily.  3. Start Miralax one capful twice daily.  4. Call in 2 weeks if no improvement in constipation.  5. Return to office in two months. At that time we will schedule upper endoscopy to stretch your esophagus. We will decide if you need a colonoscopy at that time.  6. If you continue to have dizziness, unsteadiness, or persistent headache, go see your PCP or go to the ER.

## 2018-09-08 ENCOUNTER — Ambulatory Visit: Payer: Medicare Other | Admitting: Gastroenterology

## 2018-09-08 ENCOUNTER — Encounter: Payer: Self-pay | Admitting: Gastroenterology

## 2018-09-08 NOTE — Progress Notes (Signed)
CC'D TO PCP °

## 2018-09-16 DIAGNOSIS — I10 Essential (primary) hypertension: Secondary | ICD-10-CM | POA: Diagnosis not present

## 2018-09-16 DIAGNOSIS — E559 Vitamin D deficiency, unspecified: Secondary | ICD-10-CM | POA: Diagnosis not present

## 2018-09-16 DIAGNOSIS — E782 Mixed hyperlipidemia: Secondary | ICD-10-CM | POA: Diagnosis not present

## 2018-09-18 ENCOUNTER — Other Ambulatory Visit: Payer: Self-pay | Admitting: Internal Medicine

## 2018-09-18 DIAGNOSIS — Z1231 Encounter for screening mammogram for malignant neoplasm of breast: Secondary | ICD-10-CM

## 2018-09-18 DIAGNOSIS — Z0000 Encounter for general adult medical examination without abnormal findings: Secondary | ICD-10-CM | POA: Diagnosis not present

## 2018-09-19 DIAGNOSIS — Z6827 Body mass index (BMI) 27.0-27.9, adult: Secondary | ICD-10-CM | POA: Diagnosis not present

## 2018-09-19 DIAGNOSIS — K5900 Constipation, unspecified: Secondary | ICD-10-CM | POA: Diagnosis not present

## 2018-09-19 DIAGNOSIS — R945 Abnormal results of liver function studies: Secondary | ICD-10-CM | POA: Diagnosis not present

## 2018-09-19 DIAGNOSIS — Z0001 Encounter for general adult medical examination with abnormal findings: Secondary | ICD-10-CM | POA: Diagnosis not present

## 2018-09-22 DEATH — deceased

## 2018-11-20 ENCOUNTER — Ambulatory Visit: Payer: Medicare Other | Admitting: Gastroenterology
